# Patient Record
Sex: Female | Born: 1990 | Race: Black or African American | Hispanic: No | Marital: Single | Smoking: Current some day smoker
Health system: Southern US, Community
[De-identification: ages and names within clinical notes are randomized; demographics above are authoritative.]

## PROBLEM LIST (undated history)

## (undated) ENCOUNTER — Inpatient Hospital Stay (HOSPITAL_COMMUNITY): Payer: Self-pay

## (undated) DIAGNOSIS — R011 Cardiac murmur, unspecified: Secondary | ICD-10-CM

## (undated) DIAGNOSIS — I10 Essential (primary) hypertension: Secondary | ICD-10-CM

## (undated) DIAGNOSIS — Z8759 Personal history of other complications of pregnancy, childbirth and the puerperium: Secondary | ICD-10-CM

## (undated) DIAGNOSIS — K56609 Unspecified intestinal obstruction, unspecified as to partial versus complete obstruction: Secondary | ICD-10-CM

## (undated) DIAGNOSIS — F329 Major depressive disorder, single episode, unspecified: Secondary | ICD-10-CM

## (undated) DIAGNOSIS — J45909 Unspecified asthma, uncomplicated: Secondary | ICD-10-CM

## (undated) DIAGNOSIS — A599 Trichomoniasis, unspecified: Secondary | ICD-10-CM

## (undated) DIAGNOSIS — F32A Depression, unspecified: Secondary | ICD-10-CM

## (undated) DIAGNOSIS — Q793 Gastroschisis: Secondary | ICD-10-CM

## (undated) HISTORY — PX: TONSILLECTOMY: SUR1361

## (undated) HISTORY — DX: Personal history of other complications of pregnancy, childbirth and the puerperium: Z87.59

## (undated) HISTORY — PX: GASTROSCHISIS CLOSURE: SHX1700

## (undated) HISTORY — PX: ABDOMINAL SURGERY: SHX537

---

## 2012-06-04 ENCOUNTER — Encounter (HOSPITAL_COMMUNITY): Payer: Self-pay | Admitting: Emergency Medicine

## 2012-06-04 ENCOUNTER — Emergency Department (HOSPITAL_COMMUNITY): Payer: Self-pay

## 2012-06-04 ENCOUNTER — Emergency Department (HOSPITAL_COMMUNITY)
Admission: EM | Admit: 2012-06-04 | Discharge: 2012-06-05 | Disposition: A | Discharge status: Home / Self Care | Payer: Self-pay | Attending: Emergency Medicine | Admitting: Emergency Medicine

## 2012-06-04 DIAGNOSIS — Z3202 Encounter for pregnancy test, result negative: Secondary | ICD-10-CM | POA: Insufficient documentation

## 2012-06-04 DIAGNOSIS — R1011 Right upper quadrant pain: Secondary | ICD-10-CM | POA: Insufficient documentation

## 2012-06-04 DIAGNOSIS — F17200 Nicotine dependence, unspecified, uncomplicated: Secondary | ICD-10-CM | POA: Insufficient documentation

## 2012-06-04 DIAGNOSIS — Z8719 Personal history of other diseases of the digestive system: Secondary | ICD-10-CM | POA: Insufficient documentation

## 2012-06-04 DIAGNOSIS — N898 Other specified noninflammatory disorders of vagina: Secondary | ICD-10-CM | POA: Insufficient documentation

## 2012-06-04 DIAGNOSIS — J45909 Unspecified asthma, uncomplicated: Secondary | ICD-10-CM | POA: Insufficient documentation

## 2012-06-04 DIAGNOSIS — Z711 Person with feared health complaint in whom no diagnosis is made: Secondary | ICD-10-CM

## 2012-06-04 DIAGNOSIS — N949 Unspecified condition associated with female genital organs and menstrual cycle: Secondary | ICD-10-CM | POA: Insufficient documentation

## 2012-06-04 HISTORY — DX: Unspecified intestinal obstruction, unspecified as to partial versus complete obstruction: K56.609

## 2012-06-04 HISTORY — DX: Unspecified asthma, uncomplicated: J45.909

## 2012-06-04 LAB — CBC WITH DIFFERENTIAL/PLATELET
Basophils Absolute: 0 10*3/uL (ref 0.0–0.1)
Basophils Relative: 0 % (ref 0–1)
Eosinophils Relative: 0 % (ref 0–5)
HCT: 32.4 % — ABNORMAL LOW (ref 36.0–46.0)
MCHC: 34.6 g/dL (ref 30.0–36.0)
MCV: 83.1 fL (ref 78.0–100.0)
Monocytes Absolute: 0.6 10*3/uL (ref 0.1–1.0)
Monocytes Relative: 7 % (ref 3–12)
RDW: 13.3 % (ref 11.5–15.5)

## 2012-06-04 LAB — URINALYSIS, ROUTINE W REFLEX MICROSCOPIC
Bilirubin Urine: NEGATIVE
Hgb urine dipstick: NEGATIVE
Ketones, ur: NEGATIVE mg/dL
Nitrite: NEGATIVE
Urobilinogen, UA: 1 mg/dL (ref 0.0–1.0)
pH: 5.5 (ref 5.0–8.0)

## 2012-06-04 LAB — COMPREHENSIVE METABOLIC PANEL
ALT: 5 U/L (ref 0–35)
AST: 13 U/L (ref 0–37)
Albumin: 3.1 g/dL — ABNORMAL LOW (ref 3.5–5.2)
Alkaline Phosphatase: 70 U/L (ref 39–117)
Chloride: 98 mEq/L (ref 96–112)
Potassium: 3.4 mEq/L — ABNORMAL LOW (ref 3.5–5.1)
Sodium: 135 mEq/L (ref 135–145)
Total Bilirubin: 0.2 mg/dL — ABNORMAL LOW (ref 0.3–1.2)

## 2012-06-04 LAB — WET PREP, GENITAL: Trich, Wet Prep: NONE SEEN

## 2012-06-04 MED ORDER — IBUPROFEN 600 MG PO TABS: 600.0000 mg | ORAL_TABLET | Freq: Four times a day (QID) | ORAL | Status: DC | PRN

## 2012-06-04 MED ORDER — ONDANSETRON 8 MG PO TBDP: 8.0000 mg | ORAL_TABLET | Freq: Three times a day (TID) | ORAL | Status: DC | PRN

## 2012-06-04 MED ORDER — CEFTRIAXONE SODIUM 250 MG IJ SOLR
250.0000 mg | Freq: Once | INTRAMUSCULAR | Status: AC
  Administered 2012-06-05: 250 mg via INTRAMUSCULAR
  Filled 2012-06-04: qty 250

## 2012-06-04 MED ORDER — AZITHROMYCIN 250 MG PO TABS
1000.0000 mg | ORAL_TABLET | Freq: Once | ORAL | Status: AC
  Administered 2012-06-05: 1000 mg via ORAL
  Filled 2012-06-04: qty 4

## 2012-06-04 NOTE — ED Notes (Signed)
US at bedside

## 2012-06-04 NOTE — ED Notes (Signed)
Pt c/o RUQ pain x 2 days, other areas of abd x 3 weeks. Pt states she has been on her cycle since 05/24/12.

## 2012-06-04 NOTE — ED Provider Notes (Signed)
History     CSN: [id number]  Arrival date & time 06/04/12  1945   First MD Initiated Contact with Patient 06/04/12 2121      Chief Complaint  Patient presents with  . Abdominal Pain    (Consider location/radiation/quality/duration/timing/severity/associated sxs/prior treatment) HPI Comments: Pt comes in with cc of abd pain x 2-3 weeks. She has been having diffuse abd pain over the past few days, but has noticed more severe RUQ pain x 2 weeks. Today, the pain got unbearable, and she came to the ER. She has no UTI like sx. She has no n/v/f/c/diarrhea. The pain is sharp, and intermittent. There is no known hx of gall stones. Pt is on her period. She admits to high risk behavior for STD over the past few months. No vaginal discharge, but she is fearful of having STD.  Patient is a 22 y.o. female presenting with abdominal pain. The history is provided by the patient.  Abdominal Pain Associated symptoms: vaginal bleeding   Associated symptoms: no chest pain, no constipation, no cough, no diarrhea, no dysuria, no hematuria, no nausea, no shortness of breath, no vaginal discharge and no vomiting     Past Medical History  Diagnosis Date  . SBO (small bowel obstruction)   . Asthma     Past Surgical History  Procedure Laterality Date  . Abdominal surgery      No family history on file.  History  Substance Use Topics  . Smoking status: Current Some Day Smoker  . Smokeless tobacco: Not on file  . Alcohol Use: Yes     Comment: occ    OB History   Grav Para Term Preterm Abortions TAB SAB Ect Mult Living                  Review of Systems  Constitutional: Negative for activity change.  HENT: Negative for facial swelling and neck pain.   Respiratory: Negative for cough, shortness of breath and wheezing.   Cardiovascular: Negative for chest pain.  Gastrointestinal: Positive for abdominal pain. Negative for nausea, vomiting, diarrhea, constipation, blood in stool and abdominal  distention.  Genitourinary: Positive for vaginal bleeding and pelvic pain. Negative for dysuria, hematuria, flank pain, vaginal discharge and difficulty urinating.  Skin: Negative for color change.  Neurological: Negative for speech difficulty.  Hematological: Does not bruise/bleed easily.  Psychiatric/Behavioral: Negative for confusion.    Allergies  Review of patient's allergies indicates no known allergies.  Home Medications   Current Outpatient Rx  Name  Route  Sig  Dispense  Refill  . ibuprofen (ADVIL,MOTRIN) 200 MG tablet   Oral   Take 200 mg by mouth every 6 (six) hours as needed for pain.           BP 137/85  Pulse 93  Temp(Src) 99.2 F (37.3 C) (Oral)  Resp 18  Ht 5\' 9"  (1.753 m)  Wt 218 lb (98.884 kg)  BMI 32.18 kg/m2  SpO2 100%  LMP 05/24/2012  Physical Exam  Nursing note and vitals reviewed. Constitutional: She is oriented to person, place, and time. She appears well-developed and well-nourished.  HENT:  Head: Normocephalic and atraumatic.  Eyes: EOM are normal. Pupils are equal, round, and reactive to light.  Neck: Neck supple.  Cardiovascular: Normal rate, regular rhythm and normal heart sounds.   No murmur heard. Pulmonary/Chest: Effort normal. No respiratory distress.  Abdominal: Soft. She exhibits no distension. There is tenderness. There is no rebound and no guarding.  Diffuse tenderness. Epigastrium,  ruq, and rlq. Pt has no murphy's signs or mcburney's point.  Neurological: She is alert and oriented to person, place, and time.  Skin: Skin is warm and dry.    ED Course  Procedures (including critical care time)  Labs Reviewed  COMPREHENSIVE METABOLIC PANEL - Abnormal; Notable for the following:    Potassium 3.4 (*)    Albumin 3.1 (*)    Total Bilirubin 0.2 (*)    All other components within normal limits  CBC WITH DIFFERENTIAL - Abnormal; Notable for the following:    Hemoglobin 11.2 (*)    HCT 32.4 (*)    All other components within  normal limits  URINALYSIS, ROUTINE W REFLEX MICROSCOPIC - Abnormal; Notable for the following:    Specific Gravity, Urine 1.036 (*)    All other components within normal limits  GC/CHLAMYDIA PROBE AMP  WET PREP, GENITAL  LIPASE, BLOOD  POCT PREGNANCY, URINE   No results found.   No diagnosis found.    MDM  DDx includes: Pancreatitis Hepatobiliary pathology including cholecystitis Gastritis/PUD Intra abdominal abscess Diverticulitis Appendicitis Hernia Nephrolithiasis Pyelonephritis UTI/Cystitis Ovarian cyst TOA Ectopic pregnancy PID STD  Pt comes in with cc of abd pain. The pain has been present for 2-3 weeks, so dont suspect vascular etiology, such as ovarian torsion and conditions like appendicitis.  We will get UA to r/o gall stones and also to ensure there is no ovarian cyst or mass causing the pain - as the pain is vague and diffuse. Pt is young, child bearing, and we have no indication for CT at this time.  Pt will be treated for GC and Chlamydia. No PID on exam.  Dr. Dierdre Highman to f/u on the exam findings of the [place].            Derwood Kaplan, MD 06/04/12 [phone]

## 2012-06-05 MED ORDER — LIDOCAINE HCL 1 % IJ SOLN
INTRAMUSCULAR | Status: AC
  Filled 2012-06-05: qty 20

## 2012-06-05 MED ORDER — POTASSIUM CHLORIDE CRYS ER 20 MEQ PO TBCR
20.0000 meq | EXTENDED_RELEASE_TABLET | Freq: Once | ORAL | Status: AC
  Administered 2012-06-05: 20 meq via ORAL
  Filled 2012-06-05: qty 1

## 2012-06-05 MED ORDER — HYDROCODONE-ACETAMINOPHEN 7.5-325 MG/15ML PO SOLN
10.0000 mL | Freq: Four times a day (QID) | ORAL | Status: DC | PRN
Start: 2012-06-05 — End: 2013-04-02

## 2012-06-05 MED ORDER — ONDANSETRON 8 MG PO TBDP: 8.0000 mg | ORAL_TABLET | Freq: Three times a day (TID) | ORAL | Status: DC | PRN

## 2012-06-05 MED ORDER — IBUPROFEN 600 MG PO TABS: 600.0000 mg | ORAL_TABLET | Freq: Four times a day (QID) | ORAL | Status: DC | PRN

## 2012-06-06 LAB — GC/CHLAMYDIA PROBE AMP: GC Probe RNA: NEGATIVE

## 2012-06-07 ENCOUNTER — Telehealth (HOSPITAL_COMMUNITY): Payer: Self-pay | Admitting: Emergency Medicine

## 2012-06-11 ENCOUNTER — Telehealth (HOSPITAL_COMMUNITY): Payer: Self-pay | Admitting: Emergency Medicine

## 2012-06-12 ENCOUNTER — Telehealth (HOSPITAL_COMMUNITY): Payer: Self-pay | Admitting: Emergency Medicine

## 2012-07-09 ENCOUNTER — Telehealth (HOSPITAL_COMMUNITY): Payer: Self-pay | Admitting: Emergency Medicine

## 2012-07-09 NOTE — ED Notes (Signed)
No response to letter sent after 30 days. Chart sent to Medical Records. °

## 2013-04-02 ENCOUNTER — Encounter (HOSPITAL_COMMUNITY): Payer: Self-pay | Admitting: Emergency Medicine

## 2013-04-02 ENCOUNTER — Emergency Department (HOSPITAL_COMMUNITY)
Admission: EM | Admit: 2013-04-02 | Discharge: 2013-04-02 | Disposition: A | Discharge status: Home / Self Care | Payer: PRIVATE HEALTH INSURANCE | Attending: Emergency Medicine | Admitting: Emergency Medicine

## 2013-04-02 DIAGNOSIS — N898 Other specified noninflammatory disorders of vagina: Secondary | ICD-10-CM | POA: Insufficient documentation

## 2013-04-02 DIAGNOSIS — Z3202 Encounter for pregnancy test, result negative: Secondary | ICD-10-CM | POA: Insufficient documentation

## 2013-04-02 DIAGNOSIS — F17200 Nicotine dependence, unspecified, uncomplicated: Secondary | ICD-10-CM | POA: Insufficient documentation

## 2013-04-02 DIAGNOSIS — Z8719 Personal history of other diseases of the digestive system: Secondary | ICD-10-CM | POA: Insufficient documentation

## 2013-04-02 DIAGNOSIS — R102 Pelvic and perineal pain: Secondary | ICD-10-CM

## 2013-04-02 DIAGNOSIS — J45909 Unspecified asthma, uncomplicated: Secondary | ICD-10-CM | POA: Insufficient documentation

## 2013-04-02 DIAGNOSIS — N949 Unspecified condition associated with female genital organs and menstrual cycle: Secondary | ICD-10-CM | POA: Insufficient documentation

## 2013-04-02 LAB — URINALYSIS, ROUTINE W REFLEX MICROSCOPIC
Bilirubin Urine: NEGATIVE
GLUCOSE, UA: NEGATIVE mg/dL
Hgb urine dipstick: NEGATIVE
Ketones, ur: NEGATIVE mg/dL
LEUKOCYTES UA: NEGATIVE
Nitrite: NEGATIVE
PH: 6.5 (ref 5.0–8.0)
Protein, ur: NEGATIVE mg/dL
Specific Gravity, Urine: 1.022 (ref 1.005–1.030)
Urobilinogen, UA: 1 mg/dL (ref 0.0–1.0)

## 2013-04-02 LAB — WET PREP, GENITAL
CLUE CELLS WET PREP: NONE SEEN
Trich, Wet Prep: NONE SEEN
WBC WET PREP: NONE SEEN
YEAST WET PREP: NONE SEEN

## 2013-04-02 LAB — GC/CHLAMYDIA PROBE AMP
CT Probe RNA: NEGATIVE
GC Probe RNA: NEGATIVE

## 2013-04-02 LAB — POCT PREGNANCY, URINE: Preg Test, Ur: NEGATIVE

## 2013-04-02 MED ORDER — NAPROXEN 500 MG PO TABS: 500.0000 mg | ORAL_TABLET | Freq: Two times a day (BID) | ORAL | Status: DC

## 2013-04-02 MED ORDER — CEFTRIAXONE SODIUM 250 MG IJ SOLR
250.0000 mg | Freq: Once | INTRAMUSCULAR | Status: AC
  Administered 2013-04-02: 250 mg via INTRAMUSCULAR
  Filled 2013-04-02: qty 250

## 2013-04-02 MED ORDER — AZITHROMYCIN 250 MG PO TABS
1000.0000 mg | ORAL_TABLET | Freq: Once | ORAL | Status: AC
  Administered 2013-04-02: 1000 mg via ORAL
  Filled 2013-04-02: qty 4

## 2013-04-02 NOTE — ED Notes (Signed)
Pt arrived to the ED with a complaint of abdominal pain.  Pt has ben exposed to an STD.  Pt has swollen labias.  Pt is also having scant discharge.

## 2013-04-02 NOTE — ED Provider Notes (Signed)
CSN: [id number]     Arrival date & time 04/02/13  0254 History   First MD Initiated Contact with Patient 04/02/13 [phone]     Chief Complaint  Patient presents with  . Abdominal Pain  . Exposure to STD   (Consider location/radiation/quality/duration/timing/severity/associated sxs/prior Treatment) HPI 23 year old female presents to emergency room with complaint of lower pelvic pain starting today.  Yesterday.  She reports that she had slight pain and swelling to her labia.  She has had vaginal discharge with wiping.  She denies any urinary symptoms.  She is concern for possible exposure to STD.  She reports she and her boyfriend were recently treated for Chlamydia, and feels that he may be cheating on her and is concerned that she may have another STD.  No fevers no chills, no nausea no vomiting.  Normal bowel movements. Past Medical History  Diagnosis Date  . SBO (small bowel obstruction)   . Asthma    Past Surgical History  Procedure Laterality Date  . Abdominal surgery     History reviewed. No pertinent family history. History  Substance Use Topics  . Smoking status: Current Some Day Smoker  . Smokeless tobacco: Not on file  . Alcohol Use: Yes     Comment: occ   OB History   Grav Para Term Preterm Abortions TAB SAB Ect Mult Living                 Review of Systems  See History of Present Illness; otherwise all other systems are reviewed and negative Allergies  Review of patient's allergies indicates no known allergies.  Home Medications   Current Outpatient Rx  Name  Route  Sig  Dispense  Refill  . acetaminophen (TYLENOL) 500 MG tablet   Oral   Take 500 mg by mouth every 6 (six) hours as needed for mild pain or headache.          BP 138/83  Pulse 67  Temp(Src) 97.8 F (36.6 C) (Oral)  Resp 16  SpO2 100%  LMP 02/07/2013 Physical Exam  Nursing note and vitals reviewed. Constitutional: She is oriented to person, place, and time. She appears well-developed and  well-nourished.  HENT:  Head: Normocephalic and atraumatic.  Right Ear: External ear normal.  Left Ear: External ear normal.  Nose: Nose normal.  Mouth/Throat: Oropharynx is clear and moist.  Eyes: Conjunctivae and EOM are normal. Pupils are equal, round, and reactive to light.  Neck: Normal range of motion. Neck supple. No JVD present. No tracheal deviation present. No thyromegaly present.  Cardiovascular: Normal rate, regular rhythm, normal heart sounds and intact distal pulses.  Exam reveals no gallop and no friction rub.   No murmur heard. Pulmonary/Chest: Effort normal and breath sounds normal. No stridor. No respiratory distress. She has no wheezes. She has no rales. She exhibits no tenderness.  Abdominal: Soft. Bowel sounds are normal. She exhibits no distension and no mass. There is no tenderness. There is no rebound and no guarding.  Genitourinary:  External genitalia normal Vagina with thick white discharge Cervix closed no lesions No cervical motion tenderness Adnexa palpated, no masses or tenderness noted Bladder palpated no tenderness Uterus palpated no masses or tenderness    Musculoskeletal: Normal range of motion. She exhibits no edema and no tenderness.  Lymphadenopathy:    She has no cervical adenopathy.  Neurological: She is alert and oriented to person, place, and time. She exhibits normal muscle tone. Coordination normal.  Skin: Skin is warm and dry.  No rash noted. No erythema. No pallor.  Psychiatric: She has a normal mood and affect. Her behavior is normal. Judgment and thought content normal.    ED Course  Procedures (including critical care time) Labs Review Labs Reviewed  WET PREP, GENITAL  GC/CHLAMYDIA PROBE AMP  URINALYSIS, ROUTINE W REFLEX MICROSCOPIC  POCT PREGNANCY, URINE   Imaging Review No results found.  EKG Interpretation   None       MDM  No diagnosis found. 23 year old female with reported abdominal pain, none on exam today.  She  is worried about possible exposure to STD.  Plan to prophylactically  treat today with Rocephin and azithromycin.  She does not have any objective signs of PID requiring doxycycline.  No signs of urinary tract infection, no signs of yeast infection.   Kalman Drape, MD 04/02/13 [phone]

## 2013-04-02 NOTE — Discharge Instructions (Signed)
Your workup today does not show signs of vaginal infection or urinary tract infection.  You were treated for possible exposure to gonorrhea and Chlamydia.  Those labs will take a few days to return.  In the meantime, use protection when having sexual intercourse.  Return to the emergency room for worsening abdominal pain or new concerning symptoms.   Pelvic Pain, Female Female pelvic pain can be caused by many different things and start from a variety of places. Pelvic pain refers to pain that is located in the lower half of the abdomen and between your hips. The pain may occur over a short period of time (acute) or may be reoccurring (chronic). The cause of pelvic pain may be related to disorders affecting the female reproductive organs (gynecologic), but it may also be related to the bladder, kidney stones, an intestinal complication, or muscle or skeletal problems. Getting help right away for pelvic pain is important, especially if there has been severe, sharp, or a sudden onset of unusual pain. It is also important to get help right away because some types of pelvic pain can be life threatening.  CAUSES  Below are only some of the causes of pelvic pain. The causes of pelvic pain can be in one of several categories.   Gynecologic.  Pelvic inflammatory disease.  Sexually transmitted infection.  Ovarian cyst or a twisted ovarian ligament (ovarian torsion).  Uterine lining that grows outside the uterus (endometriosis).  Fibroids, cysts, or tumors.  Ovulation.  Pregnancy.  Pregnancy that occurs outside the uterus (ectopic pregnancy).  Miscarriage.  Labor.  Abruption of the placenta or ruptured uterus.  Infection.  Uterine infection (endometritis).  Bladder infection.  Diverticulitis.  Miscarriage related to a uterine infection (septic abortion).  Bladder.  Inflammation of the bladder (cystitis).  Kidney  stone(s).  Gastrointenstinal.  Constipation.  Diverticulitis.  Neurologic.  Trauma.  Feeling pelvic pain because of mental or emotional causes (psychosomatic).  Cancers of the bowel or pelvis. EVALUATION  Your caregiver will want to take a careful history of your concerns. This includes recent changes in your health, a careful gynecologic history of your periods (menses), and a sexual history. Obtaining your family history and medical history is also important. Your caregiver may suggest a pelvic exam. A pelvic exam will help identify the location and severity of the pain. It also helps in the evaluation of which organ system may be involved. In order to identify the cause of the pelvic pain and be properly treated, your caregiver may order tests. These tests may include:   A pregnancy test.  Pelvic ultrasonography.  An X-ray exam of the abdomen.  A urinalysis or evaluation of vaginal discharge.  Blood tests. HOME CARE INSTRUCTIONS   Only take over-the-counter or prescription medicines for pain, discomfort, or fever as directed by your caregiver.   Rest as directed by your caregiver.   Eat a balanced diet.   Drink enough fluids to make your urine clear or pale yellow, or as directed.   Avoid sexual intercourse if it causes pain.   Apply warm or cold compresses to the lower abdomen depending on which one helps the pain.   Avoid stressful situations.   Keep a journal of your pelvic pain. Write down when it started, where the pain is located, and if there are things that seem to be associated with the pain, such as food or your menstrual cycle.  Follow up with your caregiver as directed.  SEEK MEDICAL CARE IF:  Your  medicine does not help your pain.  You have abnormal vaginal discharge. SEEK IMMEDIATE MEDICAL CARE IF:   You have heavy bleeding from the vagina.   Your pelvic pain increases.   You feel lightheaded or faint.   You have chills.   You  have pain with urination or blood in your urine.   You have uncontrolled diarrhea or vomiting.   You have a fever or persistent symptoms for more than 3 days.  You have a fever and your symptoms suddenly get worse.   You are being physically or sexually abused.  MAKE SURE YOU:  Understand these instructions.  Will watch your condition.  Will get help if you are not doing well or get worse. Document Released: 01/06/2004 Document Revised: 08/10/2011 Document Reviewed: 05/31/2011 [facility] Patient Information 2014 [place], [place].

## 2013-07-20 ENCOUNTER — Ambulatory Visit (INDEPENDENT_AMBULATORY_CARE_PROVIDER_SITE_OTHER): Payer: PRIVATE HEALTH INSURANCE | Admitting: Family Medicine

## 2013-07-20 VITALS — BP 120/72 | HR 75 | Temp 98.3°F | Resp 18 | Ht 69.0 in | Wt 210.0 lb

## 2013-07-20 DIAGNOSIS — Z331 Pregnant state, incidental: Secondary | ICD-10-CM

## 2013-07-20 DIAGNOSIS — Z3490 Encounter for supervision of normal pregnancy, unspecified, unspecified trimester: Secondary | ICD-10-CM

## 2013-07-20 DIAGNOSIS — N926 Irregular menstruation, unspecified: Secondary | ICD-10-CM

## 2013-07-20 DIAGNOSIS — N644 Mastodynia: Secondary | ICD-10-CM

## 2013-07-20 LAB — POCT URINE PREGNANCY: PREG TEST UR: POSITIVE

## 2013-07-20 NOTE — Patient Instructions (Addendum)
You are pregnant approximately 4 weeks if the date of May 2 was the onset of your last menstrual cycle.  This would make you due on about Feb 9, a week or so earlier if you conceived on May 8.  Prenatal vitamins--ask pharmacist for a good one  Arrange to see an OB/GYN doctor in a couple of months.  Go to web MD and look for information

## 2013-07-20 NOTE — Progress Notes (Signed)
Subjective:  Patient is here with history of having had unprotected intercourse on May 8, and thinks she may be pregnant. Her last menstrual cycle started May 2 until the eighth. She has had breast tenderness. No other major complaints. She is semi-excited about it.  Objective: Pleasant young lady in no acute distress. No CVA tenderness. Abdomen soft without mass or tenderness.  Assessment: Possible pregnancy  Plan: Pregnancy test Test is positive Prenatal vitamins She is to find an OB/GYN soon. Return if problems.

## 2013-08-06 ENCOUNTER — Encounter (HOSPITAL_COMMUNITY): Payer: Self-pay | Admitting: *Deleted

## 2013-08-06 ENCOUNTER — Inpatient Hospital Stay (HOSPITAL_COMMUNITY)
Admission: AD | Admit: 2013-08-06 | Discharge: 2013-08-06 | Disposition: A | Discharge status: Home / Self Care | Payer: Medicaid Other | Source: Ambulatory Visit | Attending: Obstetrics & Gynecology | Admitting: Obstetrics & Gynecology

## 2013-08-06 ENCOUNTER — Inpatient Hospital Stay (HOSPITAL_COMMUNITY): Payer: Medicaid Other

## 2013-08-06 DIAGNOSIS — J45909 Unspecified asthma, uncomplicated: Secondary | ICD-10-CM | POA: Insufficient documentation

## 2013-08-06 DIAGNOSIS — O2390 Unspecified genitourinary tract infection in pregnancy, unspecified trimester: Secondary | ICD-10-CM | POA: Insufficient documentation

## 2013-08-06 DIAGNOSIS — B3731 Acute candidiasis of vulva and vagina: Secondary | ICD-10-CM

## 2013-08-06 DIAGNOSIS — B373 Candidiasis of vulva and vagina: Secondary | ICD-10-CM

## 2013-08-06 DIAGNOSIS — Z3490 Encounter for supervision of normal pregnancy, unspecified, unspecified trimester: Secondary | ICD-10-CM

## 2013-08-06 DIAGNOSIS — R109 Unspecified abdominal pain: Secondary | ICD-10-CM | POA: Insufficient documentation

## 2013-08-06 DIAGNOSIS — Z87891 Personal history of nicotine dependence: Secondary | ICD-10-CM | POA: Insufficient documentation

## 2013-08-06 DIAGNOSIS — O26899 Other specified pregnancy related conditions, unspecified trimester: Secondary | ICD-10-CM

## 2013-08-06 LAB — URINALYSIS, ROUTINE W REFLEX MICROSCOPIC
Bilirubin Urine: NEGATIVE
Glucose, UA: NEGATIVE mg/dL
Hgb urine dipstick: NEGATIVE
Ketones, ur: NEGATIVE mg/dL
Leukocytes, UA: NEGATIVE
NITRITE: NEGATIVE
PH: 6 (ref 5.0–8.0)
Protein, ur: NEGATIVE mg/dL
SPECIFIC GRAVITY, URINE: 1.015 (ref 1.005–1.030)
Urobilinogen, UA: 0.2 mg/dL (ref 0.0–1.0)

## 2013-08-06 LAB — HCG, QUANTITATIVE, PREGNANCY: HCG, BETA CHAIN, QUANT, S: 25109 m[IU]/mL — AB (ref <5)

## 2013-08-06 LAB — ABO/RH: ABO/RH(D): O POS

## 2013-08-06 LAB — WET PREP, GENITAL
Clue Cells Wet Prep HPF POC: NONE SEEN
Trich, Wet Prep: NONE SEEN

## 2013-08-06 MED ORDER — FLUCONAZOLE 150 MG PO TABS: ORAL_TABLET | ORAL | Status: DC

## 2013-08-06 MED ORDER — ACETAMINOPHEN 500 MG PO TABS
1000.0000 mg | ORAL_TABLET | Freq: Once | ORAL | Status: AC
  Administered 2013-08-06: 1000 mg via ORAL
  Filled 2013-08-06: qty 2

## 2013-08-06 MED ORDER — PRENATAL PLUS 27-1 MG PO TABS: 1.0000 | ORAL_TABLET | Freq: Every day | ORAL | Status: DC

## 2013-08-06 NOTE — MAU Note (Addendum)
Has been having more cramping than what she thought she should be.  Is stronger than it was.  Also having irritation around vaginal lips

## 2013-08-06 NOTE — MAU Provider Note (Signed)
Chief Complaint: Abdominal Cramping   First Provider Initiated Contact with Patient 08/06/13 1726     SUBJECTIVE HPI: Jody Palmer is a 23 y.o. G1P0 at [redacted]w[redacted]d by unsure LMP who presents with 2-3 day history of suprapubic cramping pain. She's been having mild cramping since pregnancy diagnosed but the pain has intensified. Having pain now.  Denies vaginal bleeding, dysuria, hematuria, urgency or frequency of urination. She has had some malodorous white vaginal discharge and some itching of the external genitalia. No new sex partner. Pregnancy is desired.   Past Medical History  Diagnosis Date  . SBO (small bowel obstruction)     this was during her first year of life, no problems since  . Asthma    OB History  Gravida Para Term Preterm AB SAB TAB Ectopic Multiple Living  1             # Outcome Date GA Lbr Len/2nd Weight Sex Delivery Anes PTL Lv  1 CUR              Past Surgical History  Procedure Laterality Date  . Abdominal surgery    . Tonsillectomy     History   Social History  . Marital Status: Single    Spouse Name: N/A    Number of Children: N/A  . Years of Education: N/A   Occupational History  . Not on file.   Social History Main Topics  . Smoking status: Former Smoker    Types: Cigars    Quit date: 05/06/2013  . Smokeless tobacco: Not on file  . Alcohol Use: Yes     Comment: occ  . Drug Use: Yes    Special: Marijuana     Comment: last use May 2015  . Sexual Activity: Yes     Comment: last sex June 12   Other Topics Concern  . Not on file   Social History Narrative  . No narrative on file   No current facility-administered medications on file prior to encounter.   No current outpatient prescriptions on file prior to encounter.   No Known Allergies  ROS: Pertinent items in HPI  OBJECTIVE Blood pressure 130/80, pulse 76, temperature 98.6 F (37 C), resp. rate 18, height 5\' 7"  (1.702 m), weight 97.523 kg (215 lb), last menstrual period  06/23/2013. GENERAL: Well-developed, well-nourished female in no acute distress.  HEENT: Normocephalic HEART: normal rate RESP: normal effort ABDOMEN: Soft, non-tender EXTREMITIES: Nontender, no edema NEURO: Alert and oriented SPECULUM EXAM: NEFG , Mild erythema of labia minora and vagina, small amt white discharge, cervix clean BIMANUAL: cervix thick, closed; uterus 6-8 wk size, no adnexal tenderness or masses  LAB RESULTS Results for orders placed during the hospital encounter of 08/06/13 (from the past 24 hour(s))  URINALYSIS, ROUTINE W REFLEX MICROSCOPIC     Status: None   Collection Time    08/06/13  5:05 PM      Result Value Ref Range   Color, Urine YELLOW  YELLOW   APPearance CLEAR  CLEAR   Specific Gravity, Urine 1.015  1.005 - 1.030   pH 6.0  5.0 - 8.0   Glucose, UA NEGATIVE  NEGATIVE mg/dL   Hgb urine dipstick NEGATIVE  NEGATIVE   Bilirubin Urine NEGATIVE  NEGATIVE   Ketones, ur NEGATIVE  NEGATIVE mg/dL   Protein, ur NEGATIVE  NEGATIVE mg/dL   Urobilinogen, UA 0.2  0.0 - 1.0 mg/dL   Nitrite NEGATIVE  NEGATIVE   Leukocytes, UA NEGATIVE  NEGATIVE  WET  PREP, GENITAL     Status: Abnormal   Collection Time    08/06/13  5:33 PM      Result Value Ref Range   Yeast Wet Prep HPF POC MODERATE (*) NONE SEEN   Trich, Wet Prep NONE SEEN  NONE SEEN   Clue Cells Wet Prep HPF POC NONE SEEN  NONE SEEN   WBC, Wet Prep HPF POC FEW (*) NONE SEEN  ABO/RH     Status: None   Collection Time    08/06/13  5:40 PM      Result Value Ref Range   ABO/RH(D) O POS      IMAGING US Ob Comp Less 14 Wks  08/06/2013   CLINICAL DATA:  Abdominal pain.  EXAM: OBSTETRIC <14 WK [place] AND TRANSVAGINAL OB US  TECHNIQUE: Both transabdominal and transvaginal ultrasound examinations were performed for complete evaluation of the gestation as well as the maternal uterus, adnexal regions, and pelvic cul-de-sac. Transvaginal technique was performed to assess early pregnancy.  COMPARISON:  None.  FINDINGS:  Intrauterine gestational sac: Visualized/normal in shape.  Yolk sac:  Visualized  Embryo:  Visualized  Cardiac Activity: Visualized  Heart Rate:  135 bpm  MSD:    mm    w     d  CRL:   8.9  mm   7 w 0 d                  [place] EDC: 03/25/2014  Maternal uterus/adnexae: No subchorionic hemorrhage. No adnexal masses or free fluid. Ovaries are symmetric in size and echotexture.  IMPRESSION: 7 week intrauterine pregnancy. Fetal heart rate 135 beats per min. No acute maternal findings.   Electronically Signed   By: Rolm Baptise M.D.   On: 08/06/2013 18:36   US Ob Transvaginal  08/06/2013   CLINICAL DATA:  Abdominal pain.  EXAM: OBSTETRIC <14 WK [place] AND TRANSVAGINAL OB US  TECHNIQUE: Both transabdominal and transvaginal ultrasound examinations were performed for complete evaluation of the gestation as well as the maternal uterus, adnexal regions, and pelvic cul-de-sac. Transvaginal technique was performed to assess early pregnancy.  COMPARISON:  None.  FINDINGS: Intrauterine gestational sac: Visualized/normal in shape.  Yolk sac:  Visualized  Embryo:  Visualized  Cardiac Activity: Visualized  Heart Rate:  135 bpm  MSD:    mm    w     d  CRL:   8.9  mm   7 w 0 d                  [place] EDC: 03/25/2014  Maternal uterus/adnexae: No subchorionic hemorrhage. No adnexal masses or free fluid. Ovaries are symmetric in size and echotexture.  IMPRESSION: 7 week intrauterine pregnancy. Fetal heart rate 135 beats per min. No acute maternal findings.   Electronically Signed   By: Rolm Baptise M.D.   On: 08/06/2013 18:36    MAU COURSE GC/CT sent c/o H/A after US> Acetaminophen 1000mg  po  ASSESSMENT 1. Abdominal pain affecting pregnancy   2. Yeast infection involving the vagina and surrounding area   3. Normal IUP (intrauterine pregnancy) on prenatal ultrasound     PLAN Discharge home    Medication List         fluconazole 150 MG tablet  Commonly known as:  DIFLUCAN  Take 1 tab now. Repeat in 2 days if needed      prenatal multivitamin Tabs tablet  Take 1 tablet by mouth daily at 12 noon.  prenatal vitamin w/FE, FA 27-1 MG Tabs tablet  Take 1 tablet by mouth daily.       Follow-up Information   Schedule an appointment as soon as possible for a visit with [facility] [place]. (Choose pregnancy care provider from the list given)    Contact information:   [place] [id number] [place], CNM 08/06/2013  5:29 PM

## 2013-08-06 NOTE — MAU Provider Note (Signed)
Attestation of Attending Supervision of Advanced Practitioner (CNM/NP): Evaluation and management procedures were performed by the Advanced Practitioner under my supervision and collaboration.  I have reviewed the Advanced Practitioner's note and chart, and I agree with the management and plan.  HARRAWAY-SMITH, Shadae Reino 8:44 PM

## 2013-08-06 NOTE — Discharge Instructions (Signed)
Prenatal Care Providers [facility]  Phone385 [phone]     Phone: [place]                      [facility]  @[place]     Phone: [phone]  Phone: [place]     Phone: [phone]  Phone: [facility] [place]                hone: [phone]  Phone: [phone]         [facility] [address]. Gracy Racer      Phone: [phone]  Phone: [phone]         [place] [facility]                Phone: [phone]  Wolsey Phillips)          Phone: [phone] [facility]   Phone: [phone] - First Trimester During sexual intercourse, millions of sperm go into the vagina. Only 1 sperm will penetrate and fertilize the female egg while it is in the Fallopian tube. One week later, the fertilized egg implants into the wall of the uterus. An embryo begins to develop into a baby. At 6 to 8 weeks, the eyes and face are formed and the heartbeat can be seen on ultrasound. At the end of 12 weeks (first trimester), all the baby's organs are formed. Now that you are pregnant, you will want to do everything you can to have a healthy baby. Two of the most important things are to get good prenatal care and follow your caregiver's instructions. Prenatal care is all the medical care you receive before the baby's birth. It is given to prevent, find, and treat problems during the pregnancy and childbirth. PRENATAL EXAMS  During prenatal visits, your weight, blood pressure, and urine are checked. This is done to make sure you are healthy and progressing normally during the pregnancy.  A pregnant woman should gain 25 to 35 pounds during the pregnancy. However, if you are overweight or  underweight, your caregiver will advise you regarding your weight.  Your caregiver will ask and answer questions for you.  Blood work, cervical cultures, other necessary tests, and a Pap test are done during your prenatal exams. These tests are done to check on your health and the probable health of your baby. Tests are strongly recommended and done for HIV with your permission. This is the virus that causes AIDS. These tests are done because medicines can be given to help prevent your baby from being born with this infection should you have been infected without knowing it. Blood work is also used to find out your blood type, previous infections, and follow your blood levels (hemoglobin).  Low hemoglobin (anemia) is common during pregnancy. Iron and vitamins are given to help prevent this. Later in the pregnancy, blood tests for diabetes will be done along with any other tests if any problems develop.  You may need other tests to make sure you and the baby are doing well. CHANGES DURING THE FIRST TRIMESTER  Your body  goes through many changes during pregnancy. They vary from person to person. Talk to your caregiver about changes you notice and are concerned about. Changes can include:  Your menstrual period stops.  The egg and sperm carry the genes that determine what you look like. Genes from you and your partner are forming a baby. The female genes determine whether the baby is a boy or a girl.  Your body increases in girth and you may feel bloated.  Feeling sick to your stomach (nauseous) and throwing up (vomiting). If the vomiting is uncontrollable, call your caregiver.  Your breasts will begin to enlarge and become tender.  Your nipples may stick out more and become darker.  The need to urinate more. Painful urination may mean you have a bladder infection.  Tiring easily.  Loss of appetite.  Cravings for certain kinds of food.  At first, you may gain or lose a couple of  pounds.  You may have changes in your emotions from day to day (excited to be pregnant or concerned something may go wrong with the pregnancy and baby).  You may have more vivid and strange dreams. HOME CARE INSTRUCTIONS   It is very important to avoid all smoking, alcohol and non-prescribed drugs during your pregnancy. These affect the formation and growth of the baby. Avoid chemicals while pregnant to ensure the delivery of a healthy infant.  Start your prenatal visits by the 12th week of pregnancy. They are usually scheduled monthly at first, then more often in the last 2 months before delivery. Keep your caregiver's appointments. Follow your caregiver's instructions regarding medicine use, blood and lab tests, exercise, and diet.  During pregnancy, you are providing food for you and your baby. Eat regular, well-balanced meals. Choose foods such as meat, fish, milk and other low fat dairy products, vegetables, fruits, and whole-grain breads and cereals. Your caregiver will tell you of the ideal weight gain.  You can help morning sickness by keeping soda crackers at the bedside. Eat a couple before arising in the morning. You may want to use the crackers without salt on them.  Eating 4 to 5 small meals rather than 3 large meals a day also may help the nausea and vomiting.  Drinking liquids between meals instead of during meals also seems to help nausea and vomiting.  A physical sexual relationship may be continued throughout pregnancy if there are no other problems. Problems may be early (premature) leaking of amniotic fluid from the membranes, vaginal bleeding, or belly (abdominal) pain.  Exercise regularly if there are no restrictions. Check with your caregiver or physical therapist if you are unsure of the safety of some of your exercises. Greater weight gain will occur in the last 2 trimesters of pregnancy. Exercising will help:  Control your weight.  Keep you in shape.  Prepare you  for labor and delivery.  Help you lose your pregnancy weight after you deliver your baby.  Wear a good support or jogging bra for breast tenderness during pregnancy. This may help if worn during sleep too.  Ask when prenatal classes are available. Begin classes when they are offered.  Do not use hot tubs, steam rooms, or saunas.  Wear your seat belt when driving. This protects you and your baby if you are in an accident.  Avoid raw meat, uncooked cheese, cat litter boxes, and soil used by cats throughout the pregnancy. These carry germs that can cause birth defects in the baby.  The first trimester is  a good time to visit your dentist for your dental health. Getting your teeth cleaned is okay. Use a softer toothbrush and brush gently during pregnancy.  Ask for help if you have financial, counseling, or nutritional needs during pregnancy. Your caregiver will be able to offer counseling for these needs as well as refer you for other special needs.  Do not take any medicines or herbs unless told by your caregiver.  Inform your caregiver if there is any mental or physical domestic violence.  Make a list of emergency phone numbers of family, friends, hospital, and police and fire departments.  Write down your questions. Take them to your prenatal visit.  Do not douche.  Do not cross your legs.  If you have to stand for long periods of time, rotate you feet or take small steps in a circle.  You may have more vaginal secretions that may require a sanitary pad. Do not use tampons or scented sanitary pads. MEDICINES AND DRUG USE IN PREGNANCY  Take prenatal vitamins as directed. The vitamin should contain 1 milligram of folic acid. Keep all vitamins out of reach of children. Only a couple vitamins or tablets containing iron may be fatal to a baby or young child when ingested.  Avoid use of all medicines, including herbs, over-the-counter medicines, not prescribed or suggested by your  caregiver. Only take over-the-counter or prescription medicines for pain, discomfort, or fever as directed by your caregiver. Do not use aspirin, ibuprofen, or naproxen unless directed by your caregiver.  Let your caregiver also know about herbs you may be using.  Alcohol is related to a number of birth defects. This includes fetal alcohol syndrome. All alcohol, in any form, should be avoided completely. Smoking will cause low birth rate and premature babies.  Street or illegal drugs are very harmful to the baby. They are absolutely forbidden. A baby born to an addicted mother will be addicted at birth. The baby will go through the same withdrawal an adult does.  Let your caregiver know about any medicines that you have to take and for what reason you take them. SEEK MEDICAL CARE IF:  You have any concerns or worries during your pregnancy. It is better to call with your questions if you feel they cannot wait, rather than worry about them. SEEK IMMEDIATE MEDICAL CARE IF:   An unexplained oral temperature above 102 F (38.9 C) develops, or as your caregiver suggests.  You have leaking of fluid from the vagina (birth canal). If leaking membranes are suspected, take your temperature and inform your caregiver of this when you call.  There is vaginal spotting or bleeding. Notify your caregiver of the amount and how many pads are used.  You develop a bad smelling vaginal discharge with a change in the color.  You continue to feel sick to your stomach (nauseated) and have no relief from remedies suggested. You vomit blood or coffee ground-like materials.  You lose more than 2 pounds of weight in 1 week.  You gain more than 2 pounds of weight in 1 week and you notice swelling of your face, hands, feet, or legs.  You gain 5 pounds or more in 1 week (even if you do not have swelling of your hands, face, legs, or feet).  You get exposed to [place] measles and have never had them.  You are exposed  to fifth disease or chickenpox.  You develop belly (abdominal) pain. Round ligament discomfort is a common non-cancerous (benign) cause of  abdominal pain in pregnancy. Your caregiver still must evaluate this.  You develop headache, fever, diarrhea, pain with urination, or shortness of breath.  You fall or are in a car accident or have any kind of trauma.  There is mental or physical violence in your home. Document Released: 02/02/2001 Document Revised: 11/03/2011 Document Reviewed: 08/06/2008 [facility] [employer] Patient Information 2014 [place].

## 2013-08-07 LAB — GC/CHLAMYDIA PROBE AMP
CT PROBE, AMP APTIMA: NEGATIVE
GC Probe RNA: NEGATIVE

## 2013-08-24 ENCOUNTER — Inpatient Hospital Stay (HOSPITAL_COMMUNITY)
Admission: AD | Admit: 2013-08-24 | Discharge: 2013-08-24 | Disposition: A | Discharge status: Home / Self Care | Payer: Medicaid Other | Source: Ambulatory Visit | Attending: Obstetrics and Gynecology | Admitting: Obstetrics and Gynecology

## 2013-08-24 ENCOUNTER — Encounter (HOSPITAL_COMMUNITY): Payer: Self-pay | Admitting: *Deleted

## 2013-08-24 DIAGNOSIS — Z87891 Personal history of nicotine dependence: Secondary | ICD-10-CM | POA: Insufficient documentation

## 2013-08-24 DIAGNOSIS — O9989 Other specified diseases and conditions complicating pregnancy, childbirth and the puerperium: Principal | ICD-10-CM

## 2013-08-24 DIAGNOSIS — O99891 Other specified diseases and conditions complicating pregnancy: Secondary | ICD-10-CM | POA: Insufficient documentation

## 2013-08-24 DIAGNOSIS — R109 Unspecified abdominal pain: Secondary | ICD-10-CM | POA: Diagnosis not present

## 2013-08-24 DIAGNOSIS — O26899 Other specified pregnancy related conditions, unspecified trimester: Secondary | ICD-10-CM

## 2013-08-24 LAB — URINALYSIS, ROUTINE W REFLEX MICROSCOPIC
Bilirubin Urine: NEGATIVE
GLUCOSE, UA: NEGATIVE mg/dL
Hgb urine dipstick: NEGATIVE
Ketones, ur: NEGATIVE mg/dL
Leukocytes, UA: NEGATIVE
Nitrite: NEGATIVE
PROTEIN: NEGATIVE mg/dL
Specific Gravity, Urine: 1.02 (ref 1.005–1.030)
UROBILINOGEN UA: 0.2 mg/dL (ref 0.0–1.0)
pH: 6 (ref 5.0–8.0)

## 2013-08-24 NOTE — MAU Note (Signed)
Pt having cramping.  Pt was seen for cramping a few weeks ago, continues to have cramping.  Denies bleeding or discharge.

## 2013-08-24 NOTE — MAU Note (Signed)
Last intercourse 2+weeks ago, denies bleeding or vag d/c.

## 2013-08-24 NOTE — MAU Provider Note (Signed)
Chief Complaint: Dysmenorrhea   First Provider Initiated Contact with Patient 08/24/13 1034     SUBJECTIVE HPI: Jody Palmer is a 23 y.o. G1P0 at [redacted]w[redacted]d by LMP who presents to maternity admissions reporting abdominal cramping daily.  She reports this is unchanged from her previous visit to MAU 2 weeks ago but it is continuous so she is worried about the pregnancy.  She denies LOF, vaginal bleeding, vaginal itching/burning, urinary symptoms, h/a, dizziness, n/v, or fever/chills.     Past Medical History  Diagnosis Date  . SBO (small bowel obstruction)     this was during her first year of life, no problems since  . Asthma    Past Surgical History  Procedure Laterality Date  . Abdominal surgery    . Tonsillectomy     History   Social History  . Marital Status: Single    Spouse Name: N/A    Number of Children: N/A  . Years of Education: N/A   Occupational History  . Not on file.   Social History Main Topics  . Smoking status: Former Smoker    Types: Cigars    Quit date: 05/06/2013  . Smokeless tobacco: Never Used  . Alcohol Use: Yes     Comment: occ  . Drug Use: Yes    Special: Marijuana     Comment: last use May 2015  . Sexual Activity: Yes     Comment: last sex June 12   Other Topics Concern  . Not on file   Social History Narrative  . No narrative on file   No current facility-administered medications on file prior to encounter.   Current Outpatient Prescriptions on File Prior to Encounter  Medication Sig Dispense Refill  . Prenatal Vit-Fe Fumarate-FA (PRENATAL MULTIVITAMIN) TABS tablet Take 1 tablet by mouth daily at 12 noon.       No Known Allergies  ROS: Pertinent items in HPI  OBJECTIVE Blood pressure 122/70, pulse 71, temperature 99 F (37.2 C), temperature source Oral, resp. rate 16, height 5\' 8"  (1.727 m), weight 99.973 kg (220 lb 6.4 oz), last menstrual period 06/23/2013. GENERAL: Well-developed, well-nourished female in no acute distress.   HEENT: Normocephalic HEART: normal rate RESP: normal effort ABDOMEN: Soft, non-tender EXTREMITIES: Nontender, no edema NEURO: Alert and oriented    LAB RESULTS Results for orders placed during the hospital encounter of 08/24/13 (from the past 24 hour(s))  URINALYSIS, ROUTINE W REFLEX MICROSCOPIC     Status: None   Collection Time    08/24/13  9:11 AM      Result Value Ref Range   Color, Urine YELLOW  YELLOW   APPearance CLEAR  CLEAR   Specific Gravity, Urine 1.020  1.005 - 1.030   pH 6.0  5.0 - 8.0   Glucose, UA NEGATIVE  NEGATIVE mg/dL   Hgb urine dipstick NEGATIVE  NEGATIVE   Bilirubin Urine NEGATIVE  NEGATIVE   Ketones, ur NEGATIVE  NEGATIVE mg/dL   Protein, ur NEGATIVE  NEGATIVE mg/dL   Urobilinogen, UA 0.2  0.0 - 1.0 mg/dL   Nitrite NEGATIVE  NEGATIVE   Leukocytes, UA NEGATIVE  NEGATIVE    IMAGING  08/24/13 Bedside sono by Dr Leslie Andrea reveals viable pregnancy with size consistent with dates.  US Ob Comp Less 14 Wks  08/06/2013   CLINICAL DATA:  Abdominal pain.  EXAM: OBSTETRIC <14 WK [place] AND TRANSVAGINAL OB US  TECHNIQUE: Both transabdominal and transvaginal ultrasound examinations were performed for complete evaluation of the gestation as well as  the maternal uterus, adnexal regions, and pelvic cul-de-sac. Transvaginal technique was performed to assess early pregnancy.  COMPARISON:  None.  FINDINGS: Intrauterine gestational sac: Visualized/normal in shape.  Yolk sac:  Visualized  Embryo:  Visualized  Cardiac Activity: Visualized  Heart Rate:  135 bpm  MSD:    mm    w     d  CRL:   8.9  mm   7 w 0 d                  [place] EDC: 03/25/2014  Maternal uterus/adnexae: No subchorionic hemorrhage. No adnexal masses or free fluid. Ovaries are symmetric in size and echotexture.  IMPRESSION: 7 week intrauterine pregnancy. Fetal heart rate 135 beats per min. No acute maternal findings.   Electronically Signed   By: Rolm Baptise M.D.   On: 08/06/2013 18:36   ASSESSMENT 1. Abdominal pain  affecting pregnancy     PLAN Discharge home F/U with prenatal care as scheduled Increase PO fluids Return to MAU as needed for emergencies    Medication List         albuterol 108 (90 BASE) MCG/ACT inhaler  Commonly known as:  PROVENTIL HFA;VENTOLIN HFA  Inhale 1-2 puffs into the lungs every 6 (six) hours as needed for wheezing or shortness of breath.     prenatal multivitamin Tabs tablet  Take 1 tablet by mouth daily at 12 noon.       Follow-up Information   Follow up with [facility] [place]. (For prenatal care)    Contact information:   [place] [phone]      Follow up with [place].   Contact information:   [address] [id number] [place] [address] [place] Certified Nurse-Midwife 08/24/2013  2:54 PM

## 2013-08-24 NOTE — Discharge Instructions (Signed)

## 2013-08-26 NOTE — MAU Provider Note (Signed)
Attestation of Attending Supervision of Advanced Practitioner (CNM/NP): Evaluation and management procedures were performed by the Advanced Practitioner under my supervision and collaboration.  I have reviewed the Advanced Practitioner's note and chart, and I agree with the management and plan.  Kaidence Callaway 08/26/2013 8:12 PM

## 2013-08-30 ENCOUNTER — Other Ambulatory Visit (HOSPITAL_COMMUNITY): Payer: Self-pay | Admitting: Family

## 2013-08-30 DIAGNOSIS — Z3689 Encounter for other specified antenatal screening: Secondary | ICD-10-CM

## 2013-08-30 LAB — OB RESULTS CONSOLE GC/CHLAMYDIA
CHLAMYDIA, DNA PROBE: NEGATIVE
Gonorrhea: NEGATIVE

## 2013-08-30 LAB — OB RESULTS CONSOLE ANTIBODY SCREEN: ANTIBODY SCREEN: NEGATIVE

## 2013-08-30 LAB — OB RESULTS CONSOLE VARICELLA ZOSTER ANTIBODY, IGG: Varicella: IMMUNE

## 2013-08-30 LAB — OB RESULTS CONSOLE RUBELLA ANTIBODY, IGM: RUBELLA: IMMUNE

## 2013-08-30 LAB — OB RESULTS CONSOLE HEPATITIS B SURFACE ANTIGEN: HEP B S AG: NEGATIVE

## 2013-08-30 LAB — HEMOGLOBIN EVAL RFX ELECTROPHORESIS
Cystic Fibrosis Profile: NEGATIVE
Hemoglobin Evaluation: NORMAL

## 2013-08-30 LAB — OB RESULTS CONSOLE ABO/RH: RH TYPE: POSITIVE

## 2013-08-30 LAB — OB RESULTS CONSOLE RPR: RPR: NONREACTIVE

## 2013-08-30 LAB — OB RESULTS CONSOLE HIV ANTIBODY (ROUTINE TESTING): HIV: NONREACTIVE

## 2013-10-31 ENCOUNTER — Ambulatory Visit (HOSPITAL_COMMUNITY)
Admission: RE | Admit: 2013-10-31 | Discharge: 2013-10-31 | Disposition: A | Discharge status: Home / Self Care | Payer: PRIVATE HEALTH INSURANCE | Source: Ambulatory Visit | Attending: Family | Admitting: Family

## 2013-10-31 ENCOUNTER — Other Ambulatory Visit (HOSPITAL_COMMUNITY): Payer: Self-pay | Admitting: Family

## 2013-10-31 DIAGNOSIS — Z3689 Encounter for other specified antenatal screening: Secondary | ICD-10-CM

## 2013-10-31 DIAGNOSIS — O99210 Obesity complicating pregnancy, unspecified trimester: Secondary | ICD-10-CM | POA: Diagnosis not present

## 2013-10-31 DIAGNOSIS — O358XX0 Maternal care for other (suspected) fetal abnormality and damage, not applicable or unspecified: Secondary | ICD-10-CM | POA: Insufficient documentation

## 2013-10-31 DIAGNOSIS — E669 Obesity, unspecified: Secondary | ICD-10-CM | POA: Insufficient documentation

## 2013-10-31 DIAGNOSIS — O352XX0 Maternal care for (suspected) hereditary disease in fetus, not applicable or unspecified: Secondary | ICD-10-CM | POA: Insufficient documentation

## 2013-10-31 DIAGNOSIS — O352XX1 Maternal care for (suspected) hereditary disease in fetus, fetus 1: Secondary | ICD-10-CM

## 2013-10-31 DIAGNOSIS — O99891 Other specified diseases and conditions complicating pregnancy: Secondary | ICD-10-CM

## 2013-10-31 DIAGNOSIS — O9989 Other specified diseases and conditions complicating pregnancy, childbirth and the puerperium: Secondary | ICD-10-CM

## 2013-11-06 ENCOUNTER — Other Ambulatory Visit (HOSPITAL_COMMUNITY): Payer: Self-pay | Admitting: Family

## 2013-11-06 ENCOUNTER — Other Ambulatory Visit (HOSPITAL_COMMUNITY): Payer: Self-pay | Admitting: Nurse Practitioner

## 2013-11-06 DIAGNOSIS — Z3689 Encounter for other specified antenatal screening: Secondary | ICD-10-CM

## 2013-12-12 ENCOUNTER — Ambulatory Visit (HOSPITAL_COMMUNITY)
Admission: RE | Admit: 2013-12-12 | Discharge: 2013-12-12 | Disposition: A | Discharge status: Home / Self Care | Payer: Medicaid Other | Source: Ambulatory Visit | Attending: Nurse Practitioner | Admitting: Nurse Practitioner

## 2013-12-12 DIAGNOSIS — Z36 Encounter for antenatal screening of mother: Secondary | ICD-10-CM | POA: Insufficient documentation

## 2013-12-12 DIAGNOSIS — Z3689 Encounter for other specified antenatal screening: Secondary | ICD-10-CM

## 2013-12-24 ENCOUNTER — Encounter (HOSPITAL_COMMUNITY): Payer: Self-pay | Admitting: *Deleted

## 2014-01-06 ENCOUNTER — Inpatient Hospital Stay (HOSPITAL_COMMUNITY)
Admission: AD | Admit: 2014-01-06 | Discharge: 2014-01-07 | Disposition: A | Discharge status: Home / Self Care | Payer: Medicaid Other | Source: Ambulatory Visit | Attending: Obstetrics & Gynecology | Admitting: Obstetrics & Gynecology

## 2014-01-06 DIAGNOSIS — O98813 Other maternal infectious and parasitic diseases complicating pregnancy, third trimester: Secondary | ICD-10-CM | POA: Insufficient documentation

## 2014-01-06 DIAGNOSIS — B373 Candidiasis of vulva and vagina: Secondary | ICD-10-CM | POA: Insufficient documentation

## 2014-01-06 DIAGNOSIS — B3731 Acute candidiasis of vulva and vagina: Secondary | ICD-10-CM

## 2014-01-06 DIAGNOSIS — O4693 Antepartum hemorrhage, unspecified, third trimester: Secondary | ICD-10-CM | POA: Insufficient documentation

## 2014-01-06 DIAGNOSIS — Z87891 Personal history of nicotine dependence: Secondary | ICD-10-CM | POA: Diagnosis not present

## 2014-01-06 DIAGNOSIS — Z3A29 29 weeks gestation of pregnancy: Secondary | ICD-10-CM | POA: Insufficient documentation

## 2014-01-06 HISTORY — DX: Cardiac murmur, unspecified: R01.1

## 2014-01-06 HISTORY — DX: Gastroschisis: Q79.3

## 2014-01-07 ENCOUNTER — Encounter (HOSPITAL_COMMUNITY): Payer: Self-pay

## 2014-01-07 DIAGNOSIS — O4693 Antepartum hemorrhage, unspecified, third trimester: Secondary | ICD-10-CM

## 2014-01-07 LAB — WET PREP, GENITAL
Clue Cells Wet Prep HPF POC: NONE SEEN
TRICH WET PREP: NONE SEEN
YEAST WET PREP: NONE SEEN

## 2014-01-07 LAB — URINALYSIS, ROUTINE W REFLEX MICROSCOPIC
BILIRUBIN URINE: NEGATIVE
Glucose, UA: NEGATIVE mg/dL
HGB URINE DIPSTICK: NEGATIVE
Ketones, ur: NEGATIVE mg/dL
Nitrite: NEGATIVE
PH: 5.5 (ref 5.0–8.0)
Protein, ur: NEGATIVE mg/dL
UROBILINOGEN UA: 0.2 mg/dL (ref 0.0–1.0)

## 2014-01-07 LAB — URINE MICROSCOPIC-ADD ON

## 2014-01-07 MED ORDER — TERCONAZOLE 0.4 % VA CREA: 1.0000 | TOPICAL_CREAM | Freq: Every day | VAGINAL | Status: DC

## 2014-01-07 NOTE — Discharge Instructions (Signed)
Candidal Vulvovaginitis Candidal vulvovaginitis is an infection of the vagina and vulva. The vulva is the skin around the opening of the vagina. This may cause itching and discomfort in and around the vagina.  HOME CARE  Only take medicine as told by your doctor.  Do not have sex (intercourse) until the infection is healed or as told by your doctor.  Practice safe sex.  Tell your sex partner about your infection.  Do not douche or use tampons.  Wear cotton underwear. Do not wear tight pants or panty hose.  Eat yogurt. This may help treat and prevent yeast infections. GET HELP RIGHT AWAY IF:   You have a fever.  Your problems get worse during treatment or do not get better in 3 days.  You have discomfort, irritation, or itching in your vagina or vulva area.  You have pain after sex.  You start to get belly (abdominal) pain. MAKE SURE YOU:  Understand these instructions.  Will watch your condition.  Will get help right away if you are not doing well or get worse. Document Released: 05/07/2008 Document Revised: 02/13/2013 Document Reviewed: 05/07/2008 [facility] Patient Information 2015 [place], [place]. This information is not intended to replace advice given to you by your health care provider. Make sure you discuss any questions you have with your health care provider.   Pelvic Rest Pelvic rest is sometimes recommended for women when:   The placenta is partially or completely covering the opening of the cervix (placenta previa).  There is bleeding between the uterine wall and the amniotic sac in the first trimester (subchorionic hemorrhage).  The cervix begins to open without labor starting (incompetent cervix, cervical insufficiency).  The labor is too early (preterm labor). HOME CARE INSTRUCTIONS  Do not have sexual intercourse, stimulation, or an orgasm.  Do not use tampons, douche, or put anything in the vagina.  Do not lift anything over 10 pounds (4.5  kg).  Avoid strenuous activity or straining your pelvic muscles. SEEK MEDICAL CARE IF:  You have any vaginal bleeding during pregnancy. Treat this as a potential emergency.  You have cramping pain felt low in the stomach (stronger than menstrual cramps).  You notice vaginal discharge (watery, mucus, or bloody).  You have a low, dull backache.  There are regular contractions or uterine tightening. SEEK IMMEDIATE MEDICAL CARE IF: You have vaginal bleeding and have placenta previa.  Document Released: 06/05/2010 Document Revised: 05/03/2011 Document Reviewed: 06/05/2010 [facility] Patient Information 2015 [place], [place]. This information is not intended to replace advice given to you by your health care provider. Make sure you discuss any questions you have with your health care provider.  Vaginal Bleeding During Pregnancy, Third Trimester A small amount of bleeding (spotting) from the vagina is relatively common in pregnancy. Various things can cause bleeding or spotting in pregnancy. Sometimes the bleeding is normal and is not a problem. However, bleeding during the third trimester can also be a sign of something serious for the mother and the baby. Be sure to tell your health care provider about any vaginal bleeding right away.  Some possible causes of vaginal bleeding during the third trimester include:   The placenta may be partially or completely covering the opening to the cervix (placenta previa).   The placenta may have separated from the uterus (abruption of the placenta).   There may be an infection or growth on the cervix.   You may be starting labor, called discharging of the mucus plug.   The  placenta may grow into the muscle layer of the uterus (placenta accreta).  HOME CARE INSTRUCTIONS  Watch your condition for any changes. The following actions may help to lessen any discomfort you are feeling:   Follow your health care provider's instructions for limiting your  activity. If your health care provider orders bed rest, you may need to stay in bed and only get up to use the bathroom. However, your health care provider may allow you to continue light activity.  If needed, make plans for someone to help with your regular activities and responsibilities while you are on bed rest.  Keep track of the number of pads you use each day, how often you change pads, and how soaked (saturated) they are. Write this down.  Do not use tampons. Do not douche.  Do not have sexual intercourse or orgasms until approved by your health care provider.  Follow your health care provider's advice about lifting, driving, and physical activities.  If you pass any tissue from your vagina, save the tissue so you can show it to your health care provider.   Only take over-the-counter or prescription medicines as directed by your health care provider.  Do not take aspirin because it can make you bleed.   Keep all follow-up appointments as directed by your health care provider. SEEK MEDICAL CARE IF:  You have any vaginal bleeding during any part of your pregnancy.  You have cramps or labor pains.  You have a fever, not controlled by medicine. SEEK IMMEDIATE MEDICAL CARE IF:   You have severe cramps or pain in your back or belly (abdomen).  You have chills.  You have a gush of fluid from the vagina.  You pass large clots or tissue from your vagina.  Your bleeding increases.  You feel light-headed or weak.  You pass out.  You feel less movement or no movement of the baby.  MAKE SURE YOU:  Understand these instructions.  Will watch your condition.  Will get help right away if you are not doing well or get worse. Document Released: 05/01/2002 Document Revised: 02/13/2013 Document Reviewed: 10/16/2012 [facility] Patient Information 2015 [place], [place]. This information is not intended to replace advice given to you by your health care provider. Make sure you  discuss any questions you have with your health care provider.

## 2014-01-07 NOTE — MAU Provider Note (Signed)
Chief Complaint:  Abdominal Pain and Vaginal Bleeding   First Provider Initiated Contact with Patient 01/07/14 0040     HPI: Jody Palmer is a 23 y.o. G1P0 at [redacted]w[redacted]d who presents to maternity admissions reporting cramping, pelvic pressure since yesterday. Pink spotting on toilet paper x 2 today. On day 2 of Monistat 3 for self-Dx yeast infection. Difficulty inserting applicator. Reports few contractions per day, no leakage of fluid, GI complaints, Urinary complaints, fever, chills. Good fetal movement.   Placenta anterior per anatomy scan. O Pos.   Pregnancy Course: Uncomplicated  Past Medical History: Past Medical History  Diagnosis Date  . SBO (small bowel obstruction)     this was during her first year of life, no problems since  . Asthma   . 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 Gastroschisis     Past obstetric history: OB History  Gravida Para Term Preterm AB SAB TAB Ectopic Multiple Living  1             # Outcome Date GA Lbr Len/2nd Weight Sex Delivery Anes PTL Lv  1 Current               Past Surgical History: Past Surgical History  Procedure Laterality Date  . Abdominal surgery    . Tonsillectomy    . Gastroschisis closure  1991     Family History: History reviewed. No pertinent family history.  Social History: History  Substance Use Topics  . Smoking status: Former Smoker    Types: Cigars    Quit date: 05/06/2013  . Smokeless tobacco: Never Used  . Alcohol Use: No     Comment: occ    Allergies: No Known Allergies  Meds:  Prescriptions prior to admission  Medication Sig Dispense Refill Last Dose  . acetaminophen (TYLENOL) 325 MG tablet Take 650 mg by mouth every 6 (six) hours as needed for moderate pain.   01/06/2014 at 1100  . albuterol (PROVENTIL HFA;VENTOLIN HFA) 108 (90 BASE) MCG/ACT inhaler Inhale 1-2 puffs into the lungs every 6 (six) hours as needed for wheezing or shortness of breath.   Past Month at Unknown time  . miconazole  (MICOTIN) 200 MG vaginal suppository Place 200 mg vaginally at bedtime.   01/06/2014 at 1100  . Prenatal Vit-Fe Fumarate-FA (PRENATAL MULTIVITAMIN) TABS tablet Take 1 tablet by mouth daily at 12 noon.   01/06/2014 at Unknown time    ROS: Pertinent findings in history of present illness.  Physical Exam  Blood pressure 132/82, pulse 101, temperature 98.8 F (37.1 C), temperature source Oral, resp. rate 18, height 5\' 8"  (1.727 m), weight 111.222 kg (245 lb 3.2 oz), last menstrual period 06/23/2013. GENERAL: Well-developed, well-nourished female in no acute distress.  HEENT: normocephalic HEART: normal rate RESP: normal effort ABDOMEN: Soft, non-tender, gravid appropriate for gestational age. Two large, well-healed abd scars  EXTREMITIES: Nontender, no edema NEURO: alert and oriented SPECULUM EXAM: NEFG except for erythema and excoriation. Large amount of thick, white, odorless, curdlike discharge. One tiny spot of blood coming from right vaginla wall. Suspect tiny traumatic injury, possibly from Monistat applicator?, cervix clean, non-friable. No blood coming from os.  Dilation: Closed Effacement (%): Thick Exam by:: Manya Silvas CNM  FHT:  Baseline 140 , moderate variability, no accelerations present, no decelerations Contractions: None   Labs: Results for orders placed or performed during the hospital encounter of 01/06/14 (from the past 24 hour(s))  Urinalysis, Routine w reflex microscopic  Status: Abnormal   Collection Time: 01/06/14 11:59 PM  Result Value Ref Range   Color, Urine YELLOW YELLOW   APPearance CLEAR CLEAR   Specific Gravity, Urine >1.030 (H) 1.005 - 1.030   pH 5.5 5.0 - 8.0   Glucose, UA NEGATIVE NEGATIVE mg/dL   Hgb urine dipstick NEGATIVE NEGATIVE   Bilirubin Urine NEGATIVE NEGATIVE   Ketones, ur NEGATIVE NEGATIVE mg/dL   Protein, ur NEGATIVE NEGATIVE mg/dL   Urobilinogen, UA 0.2 0.0 - 1.0 mg/dL   Nitrite NEGATIVE NEGATIVE   Leukocytes, UA TRACE (A)  NEGATIVE  Urine microscopic-add on     Status: Abnormal   Collection Time: 01/06/14 11:59 PM  Result Value Ref Range   Squamous Epithelial / LPF FEW (A) RARE   WBC, UA 3-6 <3 WBC/hpf   RBC / HPF 3-6 <3 RBC/hpf   Bacteria, UA FEW (A) RARE    Imaging:   MAU Course:   Assessment: 1. Vaginal bleeding in pregnancy, third trimester--Suspect tiny traumatic injury, possibly from Monistat applicator   2. Candidiasis of vulva and vagina     Plan: Discharge home in stable condition.  Preterm labor precautions and fetal kick counts. Increase fluids and rest Fill Terazol Rx if incomplete resolution of VVC w/ Monistat 3.  Pelvic rest until Sx resolve.      Follow-up Information    Follow up with [facility] [place] On 01/08/2014.   Why:  As scheduled   Contact information:   [address]      Follow up with [place].   Why:  As needed in emergencies   Contact information:   [address] [id number] [place] [phone]       Medication List    TAKE these medications        acetaminophen 325 MG tablet  Commonly known as:  TYLENOL  Take 650 mg by mouth every 6 (six) hours as needed for moderate pain.     albuterol 108 (90 BASE) MCG/ACT inhaler  Commonly known as:  PROVENTIL HFA;VENTOLIN HFA  Inhale 1-2 puffs into the lungs every 6 (six) hours as needed for wheezing or shortness of breath.     miconazole 200 MG vaginal suppository  Commonly known as:  MICOTIN  Place 200 mg vaginally at bedtime.     prenatal multivitamin Tabs tablet  Take 1 tablet by mouth daily at 12 noon.     terconazole 0.4 % vaginal cream  Commonly known as:  TERAZOL 7  Place 1 applicator vaginally at bedtime. x 7 nights        [place], [place] 01/07/2014 1:45 AM

## 2014-01-07 NOTE — MAU Note (Signed)
Lower abdominal pressure & cramping since yesterday. Pink spotting on toilet paper x 2 today. Currently self treating for yeast infection.

## 2014-02-22 NOTE — L&D Delivery Note (Signed)
Delivery Note Progressed to complete and began pushing. Progressed quickly to crowning.  At 5:37 PM a viable and healthy female was delivered via Vaginal, Spontaneous Delivery (Presentation: Right Occiput Anterior).   Initial attempt at delivery of anterior shoulder was unsuccessful. McRoberts employed  But still unsuccessful delivery of shoulder. Suprapubic pressure was applied and we were able to grasp the anterior axilla and deliver the anterior shoulder.   Time was less than a minute   APGAR: 9, 9; weight  .   Placenta status: Intact, Spontaneous.  Cord:  with the following complications: None.    Anesthesia: Epidural  Episiotomy: None Lacerations: 2nd degree Right Periclitoral/periurethral Suture Repair: 3.0 vicryl rapide Est. Blood Loss (mL):  300   Mom to postpartum.  Baby to Couplet care / Skin to Skin   Delivery was accomplished by Dannielle Huh MD with me assisting.    [facility] 03/21/2014, 6:19 PM

## 2014-02-27 LAB — OB RESULTS CONSOLE GBS: STREP GROUP B AG: POSITIVE

## 2014-03-15 ENCOUNTER — Inpatient Hospital Stay (HOSPITAL_COMMUNITY)
Admission: AD | Admit: 2014-03-15 | Discharge: 2014-03-16 | Disposition: A | Discharge status: Home / Self Care | Payer: Medicaid Other | Source: Ambulatory Visit | Attending: Obstetrics & Gynecology | Admitting: Obstetrics & Gynecology

## 2014-03-15 ENCOUNTER — Encounter (HOSPITAL_COMMUNITY): Payer: Self-pay | Admitting: *Deleted

## 2014-03-15 DIAGNOSIS — O26893 Other specified pregnancy related conditions, third trimester: Secondary | ICD-10-CM

## 2014-03-15 DIAGNOSIS — Z87891 Personal history of nicotine dependence: Secondary | ICD-10-CM | POA: Insufficient documentation

## 2014-03-15 DIAGNOSIS — O9989 Other specified diseases and conditions complicating pregnancy, childbirth and the puerperium: Secondary | ICD-10-CM | POA: Insufficient documentation

## 2014-03-15 DIAGNOSIS — Z3A38 38 weeks gestation of pregnancy: Secondary | ICD-10-CM | POA: Insufficient documentation

## 2014-03-15 DIAGNOSIS — N898 Other specified noninflammatory disorders of vagina: Secondary | ICD-10-CM | POA: Insufficient documentation

## 2014-03-15 NOTE — MAU Note (Signed)
Pt c/o green vaginal discharge starting 2 days ago.  Denies vaginal bleeding.  States baby moving a lot.

## 2014-03-16 DIAGNOSIS — N898 Other specified noninflammatory disorders of vagina: Secondary | ICD-10-CM

## 2014-03-16 DIAGNOSIS — Z3A38 38 weeks gestation of pregnancy: Secondary | ICD-10-CM | POA: Diagnosis not present

## 2014-03-16 DIAGNOSIS — O9989 Other specified diseases and conditions complicating pregnancy, childbirth and the puerperium: Secondary | ICD-10-CM | POA: Diagnosis not present

## 2014-03-16 DIAGNOSIS — O26893 Other specified pregnancy related conditions, third trimester: Secondary | ICD-10-CM

## 2014-03-16 DIAGNOSIS — Z87891 Personal history of nicotine dependence: Secondary | ICD-10-CM | POA: Diagnosis not present

## 2014-03-16 NOTE — MAU Provider Note (Signed)
History  Chief Complaint:  Vaginal Discharge  Jody Palmer is a 24 y.o. G1P0 female at [redacted]w[redacted]d presenting with vaginal discharge x 2 days that she describes as yellowish mucous. Believes that it may be her mucous plug. Reports active fetal movement, contractions: none, vaginal bleeding: none, membranes: intact. Denies uti s/s, abnormal/malodorous vag d/c or vulvovaginal itching/irritation.   [facility]  Next visit Tuesday. Pregnancy complicated by none.  Obstetrical History: OB History    Gravida Para Term Preterm AB TAB SAB Ectopic Multiple Living   1               Past Medical History: Past Medical History  Diagnosis Date  . SBO (small bowel obstruction)     this was during her first year of life, no problems since  . Asthma   . 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 Gastroschisis     Past Surgical History: Past Surgical History  Procedure Laterality Date  . Abdominal surgery    . Tonsillectomy    . Gastroschisis closure  1991    Social History: History   Social History  . Marital Status: Single    Spouse Name: N/A    Number of Children: N/A  . Years of Education: N/A   Social History Main Topics  . Smoking status: Former Smoker    Types: Cigars    Quit date: 05/06/2013  . Smokeless tobacco: Never Used  . Alcohol Use: No     Comment: occ  . Drug Use: No     Comment: last use May 2015  . Sexual Activity: Yes    Birth Control/ Protection: None     Comment: last sex June 12   Other Topics Concern  . None   Social History Narrative    Allergies: No Known Allergies  Prescriptions prior to admission  Medication Sig Dispense Refill Last Dose  . acetaminophen (TYLENOL) 325 MG tablet Take 650 mg by mouth every 6 (six) hours as needed for moderate pain.   Past Month at Unknown time  . Prenatal Vit-Fe Fumarate-FA (PRENATAL MULTIVITAMIN) TABS tablet Take 1 tablet by mouth daily at 12 noon.   03/15/2014 at Unknown time  .  terconazole (TERAZOL 7) 0.4 % vaginal cream Place 1 applicator vaginally at bedtime. x 7 nights 45 g 0 03/14/2014 at Unknown time  . albuterol (PROVENTIL HFA;VENTOLIN HFA) 108 (90 BASE) MCG/ACT inhaler Inhale 1-2 puffs into the lungs every 6 (six) hours as needed for wheezing or shortness of breath.   More than a month at Unknown time  . miconazole (MICOTIN) 200 MG vaginal suppository Place 200 mg vaginally at bedtime.   01/06/2014 at 1100    Review of Systems  Pertinent pos/neg as indicated in HPI  Physical Exam  Blood pressure 138/89, pulse 91, temperature 98 F (36.7 C), temperature source Oral, resp. rate 16, height 5\' 8"  (1.727 m), weight 114.76 kg (253 lb), last menstrual period 06/23/2013. General appearance: alert, cooperative and no distress Lungs: normal effort Heart: regular rate  Abdomen: gravid, soft, non-tender Extremities: no edema   Spec exam: no pooling noted, fern negative Cultures/Specimens: none    Presentation: cephalic  Fetal monitoring: FHR: 140 bpm, variability: moderate,  Accelerations: Present,  decelerations:  Absent Uterine activity: no contractions noted  MAU Course    Labs:  No results found for this or any previous visit (from the past 24 hour(s)).  Assessment and Plan  A:  [redacted]w[redacted]d  SIUP  G1P0  No contractions  Cat 1 FHR  Normal discharge in pregnancy P:  Discharge to home  Reviewed s/s labor, 5/1/1  [facility] as scheduled   Ross,Lisa Wynne SNM 1/23/201612:17 AM  I have seen and examined this patient and I agree with the above. Serita Grammes CNM 4:40 AM 03/16/2014

## 2014-03-16 NOTE — Discharge Instructions (Signed)
Braxton Hicks Contractions °Contractions of the uterus can occur throughout pregnancy. Contractions are not always a sign that you are in labor.  °WHAT ARE BRAXTON HICKS CONTRACTIONS?  °Contractions that occur before labor are called Braxton Hicks contractions, or false labor. Toward the end of pregnancy (32-34 weeks), these contractions can develop more often and may become more forceful. This is not true labor because these contractions do not result in opening (dilatation) and thinning of the cervix. They are sometimes difficult to tell apart from true labor because these contractions can be forceful and people have different pain tolerances. You should not feel embarrassed if you go to the hospital with false labor. Sometimes, the only way to tell if you are in true labor is for your health care provider to look for changes in the cervix. °If there are no prenatal problems or other health problems associated with the pregnancy, it is completely safe to be sent home with false labor and await the onset of true labor. °HOW CAN YOU TELL THE DIFFERENCE BETWEEN TRUE AND FALSE LABOR? °False Labor °· The contractions of false labor are usually shorter and not as hard as those of true labor.   °· The contractions are usually irregular.   °· The contractions are often felt in the front of the lower abdomen and in the groin.   °· The contractions may go away when you walk around or change positions while lying down.   °· The contractions get weaker and are shorter lasting as time goes on.   °· The contractions do not usually become progressively stronger, regular, and closer together as with true labor.   °True Labor °· Contractions in true labor last 30-70 seconds, become very regular, usually become more intense, and increase in frequency.   °· The contractions do not go away with walking.   °· The discomfort is usually felt in the top of the uterus and spreads to the lower abdomen and low back.   °· True labor can be  determined by your health care provider with an exam. This will show that the cervix is dilating and getting thinner.   °WHAT TO REMEMBER °· Keep up with your usual exercises and follow other instructions given by your health care provider.   °· Take medicines as directed by your health care provider.   °· Keep your regular prenatal appointments.   °· Eat and drink lightly if you think you are going into labor.   °· If Braxton Hicks contractions are making you uncomfortable:   °¨ Change your position from lying down or resting to walking, or from walking to resting.   °¨ Sit and rest in a tub of warm water.   °¨ Drink 2-3 glasses of water. Dehydration may cause these contractions.   °¨ Do slow and deep breathing several times an hour.   °WHEN SHOULD I SEEK IMMEDIATE MEDICAL CARE? °Seek immediate medical care if: °· Your contractions become stronger, more regular, and closer together.   °· You have fluid leaking or gushing from your vagina.   °· You have a fever.   °· You pass blood-tinged mucus.   °· You have vaginal bleeding.   °· You have continuous abdominal pain.   °· You have low back pain that you never had before.   °· You feel your baby's head pushing down and causing pelvic pressure.   °· Your baby is not moving as much as it used to.   °Document Released: 02/08/2005 Document Revised: 02/13/2013 Document Reviewed: 11/20/2012 °ExitCare® Patient Information ©2015 [employer]. This information is not intended to replace advice given to you by your health care   provider. Make sure you discuss any questions you have with your health care provider. ° °

## 2014-03-21 ENCOUNTER — Encounter (HOSPITAL_COMMUNITY): Payer: Self-pay

## 2014-03-21 ENCOUNTER — Inpatient Hospital Stay (HOSPITAL_COMMUNITY)
Admission: AD | Admit: 2014-03-21 | Discharge: 2014-03-23 | DRG: 775 | Disposition: A | Discharge status: Home / Self Care | Payer: Medicaid Other | Source: Ambulatory Visit | Attending: Obstetrics & Gynecology | Admitting: Obstetrics & Gynecology

## 2014-03-21 ENCOUNTER — Inpatient Hospital Stay (HOSPITAL_COMMUNITY): Payer: Medicaid Other | Admitting: Anesthesiology

## 2014-03-21 DIAGNOSIS — Z3A39 39 weeks gestation of pregnancy: Secondary | ICD-10-CM | POA: Diagnosis present

## 2014-03-21 DIAGNOSIS — O99824 Streptococcus B carrier state complicating childbirth: Secondary | ICD-10-CM | POA: Diagnosis present

## 2014-03-21 DIAGNOSIS — Z833 Family history of diabetes mellitus: Secondary | ICD-10-CM

## 2014-03-21 DIAGNOSIS — Z87891 Personal history of nicotine dependence: Secondary | ICD-10-CM | POA: Diagnosis not present

## 2014-03-21 DIAGNOSIS — Z8249 Family history of ischemic heart disease and other diseases of the circulatory system: Secondary | ICD-10-CM

## 2014-03-21 DIAGNOSIS — O133 Gestational [pregnancy-induced] hypertension without significant proteinuria, third trimester: Principal | ICD-10-CM | POA: Diagnosis present

## 2014-03-21 LAB — COMPREHENSIVE METABOLIC PANEL
ALK PHOS: 118 U/L — AB (ref 39–117)
ALT: 9 U/L (ref 0–35)
AST: 17 U/L (ref 0–37)
Albumin: 3 g/dL — ABNORMAL LOW (ref 3.5–5.2)
Anion gap: 5 (ref 5–15)
BILIRUBIN TOTAL: 0.2 mg/dL — AB (ref 0.3–1.2)
BUN: 12 mg/dL (ref 6–23)
CALCIUM: 8.4 mg/dL (ref 8.4–10.5)
CO2: 23 mmol/L (ref 19–32)
CREATININE: 0.52 mg/dL (ref 0.50–1.10)
Chloride: 105 mmol/L (ref 96–112)
GFR calc Af Amer: >90 mL/min (ref >90)
GFR calc non Af Amer: >90 mL/min (ref >90)
GLUCOSE: 102 mg/dL — AB (ref 70–99)
POTASSIUM: 3.6 mmol/L (ref 3.5–5.1)
Sodium: 133 mmol/L — ABNORMAL LOW (ref 135–145)
TOTAL PROTEIN: 7.2 g/dL (ref 6.0–8.3)

## 2014-03-21 LAB — CBC
HCT: 34.7 % — ABNORMAL LOW (ref 36.0–46.0)
HEMATOCRIT: 33.2 % — AB (ref 36.0–46.0)
Hemoglobin: 12.3 g/dL (ref 12.0–15.0)
Hemoglobin: 11.7 g/dL — ABNORMAL LOW (ref 12.0–15.0)
MCH: 30.1 pg (ref 26.0–34.0)
MCH: 30.2 pg (ref 26.0–34.0)
MCHC: 35.4 g/dL (ref 30.0–36.0)
MCHC: 35.2 g/dL (ref 30.0–36.0)
MCV: 84.8 fL (ref 78.0–100.0)
MCV: 85.8 fL (ref 78.0–100.0)
PLATELETS: 237 10*3/uL (ref 150–400)
PLATELETS: 257 10*3/uL (ref 150–400)
RBC: 4.09 MIL/uL (ref 3.87–5.11)
RBC: 3.87 MIL/uL (ref 3.87–5.11)
RDW: 14.1 % (ref 11.5–15.5)
RDW: 14.3 % (ref 11.5–15.5)
WBC: 7.9 10*3/uL (ref 4.0–10.5)
WBC: 13.4 10*3/uL — ABNORMAL HIGH (ref 4.0–10.5)

## 2014-03-21 LAB — TYPE AND SCREEN
ABO/RH(D): O POS
Antibody Screen: NEGATIVE

## 2014-03-21 LAB — PROTEIN / CREATININE RATIO, URINE
CREATININE, URINE: 280 mg/dL
Protein Creatinine Ratio: 0.1 (ref 0.00–0.15)
Total Protein, Urine: 28 mg/dL

## 2014-03-21 LAB — RAPID HIV SCREEN (WH-MAU): Rapid HIV Screen: NONREACTIVE

## 2014-03-21 MED ORDER — OXYTOCIN 40 UNITS IN LACTATED RINGERS INFUSION - SIMPLE MED: 1.0000 m[IU]/min | INTRAVENOUS | Status: DC

## 2014-03-21 MED ORDER — OXYCODONE-ACETAMINOPHEN 5-325 MG PO TABS: 2.0000 | ORAL_TABLET | ORAL | Status: DC | PRN

## 2014-03-21 MED ORDER — ZOLPIDEM TARTRATE 5 MG PO TABS
5.0000 mg | ORAL_TABLET | Freq: Every evening | ORAL | Status: DC | PRN
Start: 2014-03-21 — End: 2014-03-23

## 2014-03-21 MED ORDER — FENTANYL 2.5 MCG/ML BUPIVACAINE 1/10 % EPIDURAL INFUSION (WH - ANES)
14.0000 mL/h | INTRAMUSCULAR | Status: DC | PRN
  Administered 2014-03-21: 14 mL/h via EPIDURAL
  Filled 2014-03-21: qty 125

## 2014-03-21 MED ORDER — FENTANYL CITRATE 0.05 MG/ML IJ SOLN: 100.0000 ug | INTRAMUSCULAR | Status: DC | PRN

## 2014-03-21 MED ORDER — LACTATED RINGERS IV SOLN
INTRAVENOUS | Status: DC
  Administered 2014-03-21: 15:00:00 via INTRAVENOUS

## 2014-03-21 MED ORDER — FENTANYL 2.5 MCG/ML BUPIVACAINE 1/10 % EPIDURAL INFUSION (WH - ANES)
INTRAMUSCULAR | Status: DC | PRN
  Administered 2014-03-21: 14 mL/h via EPIDURAL

## 2014-03-21 MED ORDER — OXYCODONE-ACETAMINOPHEN 5-325 MG PO TABS
1.0000 | ORAL_TABLET | ORAL | Status: DC | PRN
  Filled 2014-03-21: qty 1

## 2014-03-21 MED ORDER — SIMETHICONE 80 MG PO CHEW
80.0000 mg | CHEWABLE_TABLET | ORAL | Status: DC | PRN
Start: 2014-03-21 — End: 2014-03-23

## 2014-03-21 MED ORDER — PENICILLIN G POTASSIUM 5000000 UNITS IJ SOLR
2.5000 10*6.[IU] | INTRAVENOUS | Status: DC
  Administered 2014-03-21: 2.5 10*6.[IU] via INTRAVENOUS
  Filled 2014-03-21 (×4): qty 2.5

## 2014-03-21 MED ORDER — FENTANYL CITRATE 0.05 MG/ML IJ SOLN
100.0000 ug | Freq: Once | INTRAMUSCULAR | Status: AC
  Administered 2014-03-21: 100 ug via INTRAVENOUS
  Filled 2014-03-21: qty 2

## 2014-03-21 MED ORDER — DIBUCAINE 1 % RE OINT
1.0000 "application " | TOPICAL_OINTMENT | RECTAL | Status: DC | PRN
  Administered 2014-03-23: 1 via RECTAL
  Filled 2014-03-21: qty 28

## 2014-03-21 MED ORDER — EPHEDRINE 5 MG/ML INJ
10.0000 mg | INTRAVENOUS | Status: DC | PRN
  Filled 2014-03-21: qty 2

## 2014-03-21 MED ORDER — IBUPROFEN 600 MG PO TABS
600.0000 mg | ORAL_TABLET | Freq: Four times a day (QID) | ORAL | Status: DC
  Administered 2014-03-22 – 2014-03-23 (×6): 600 mg via ORAL
  Filled 2014-03-21 (×6): qty 1

## 2014-03-21 MED ORDER — PHENYLEPHRINE 40 MCG/ML (10ML) SYRINGE FOR IV PUSH (FOR BLOOD PRESSURE SUPPORT)
80.0000 ug | PREFILLED_SYRINGE | INTRAVENOUS | Status: DC | PRN
  Filled 2014-03-21: qty 2

## 2014-03-21 MED ORDER — DIPHENHYDRAMINE HCL 50 MG/ML IJ SOLN: 12.5000 mg | INTRAMUSCULAR | Status: DC | PRN

## 2014-03-21 MED ORDER — PHENYLEPHRINE 40 MCG/ML (10ML) SYRINGE FOR IV PUSH (FOR BLOOD PRESSURE SUPPORT)
80.0000 ug | PREFILLED_SYRINGE | INTRAVENOUS | Status: DC | PRN
  Filled 2014-03-21: qty 20
  Filled 2014-03-21: qty 2

## 2014-03-21 MED ORDER — TETANUS-DIPHTH-ACELL PERTUSSIS 5-2.5-18.5 LF-MCG/0.5 IM SUSP: 0.5000 mL | Freq: Once | INTRAMUSCULAR | Status: DC

## 2014-03-21 MED ORDER — TERBUTALINE SULFATE 1 MG/ML IJ SOLN
0.2500 mg | Freq: Once | INTRAMUSCULAR | Status: DC | PRN
  Filled 2014-03-21: qty 1

## 2014-03-21 MED ORDER — BENZOCAINE-MENTHOL 20-0.5 % EX AERO
1.0000 "application " | INHALATION_SPRAY | CUTANEOUS | Status: DC | PRN
  Administered 2014-03-22 – 2014-03-23 (×2): 1 via TOPICAL
  Filled 2014-03-21 (×2): qty 56

## 2014-03-21 MED ORDER — OXYCODONE-ACETAMINOPHEN 5-325 MG PO TABS: 1.0000 | ORAL_TABLET | ORAL | Status: DC | PRN

## 2014-03-21 MED ORDER — WITCH HAZEL-GLYCERIN EX PADS
1.0000 "application " | MEDICATED_PAD | CUTANEOUS | Status: DC | PRN
  Administered 2014-03-23: 1 via TOPICAL

## 2014-03-21 MED ORDER — LIDOCAINE HCL (PF) 1 % IJ SOLN
INTRAMUSCULAR | Status: DC | PRN
  Administered 2014-03-21 (×2): 4 mL

## 2014-03-21 MED ORDER — ONDANSETRON HCL 4 MG/2ML IJ SOLN: 4.0000 mg | INTRAMUSCULAR | Status: DC | PRN

## 2014-03-21 MED ORDER — FLEET ENEMA 7-19 GM/118ML RE ENEM: 1.0000 | ENEMA | RECTAL | Status: DC | PRN

## 2014-03-21 MED ORDER — LIDOCAINE HCL (PF) 1 % IJ SOLN
30.0000 mL | INTRAMUSCULAR | Status: DC | PRN
  Filled 2014-03-21: qty 30

## 2014-03-21 MED ORDER — LANOLIN HYDROUS EX OINT: TOPICAL_OINTMENT | CUTANEOUS | Status: DC | PRN

## 2014-03-21 MED ORDER — CITRIC ACID-SODIUM CITRATE 334-500 MG/5ML PO SOLN
30.0000 mL | ORAL | Status: DC | PRN
  Administered 2014-03-21: 30 mL via ORAL
  Filled 2014-03-21: qty 15

## 2014-03-21 MED ORDER — ACETAMINOPHEN 325 MG PO TABS: 650.0000 mg | ORAL_TABLET | ORAL | Status: DC | PRN

## 2014-03-21 MED ORDER — OXYTOCIN BOLUS FROM INFUSION: 500.0000 mL | INTRAVENOUS | Status: DC

## 2014-03-21 MED ORDER — PENICILLIN G POTASSIUM 5000000 UNITS IJ SOLR
5.0000 10*6.[IU] | Freq: Once | INTRAVENOUS | Status: AC
  Administered 2014-03-21: 5 10*6.[IU] via INTRAVENOUS
  Filled 2014-03-21: qty 5

## 2014-03-21 MED ORDER — LACTATED RINGERS IV SOLN: 500.0000 mL | Freq: Once | INTRAVENOUS | Status: DC

## 2014-03-21 MED ORDER — LACTATED RINGERS IV SOLN: 500.0000 mL | INTRAVENOUS | Status: DC | PRN

## 2014-03-21 MED ORDER — SENNOSIDES-DOCUSATE SODIUM 8.6-50 MG PO TABS
2.0000 | ORAL_TABLET | ORAL | Status: DC
  Administered 2014-03-22: 2 via ORAL
  Filled 2014-03-21 (×2): qty 2

## 2014-03-21 MED ORDER — ONDANSETRON HCL 4 MG/2ML IJ SOLN: 4.0000 mg | Freq: Four times a day (QID) | INTRAMUSCULAR | Status: DC | PRN

## 2014-03-21 MED ORDER — ONDANSETRON HCL 4 MG PO TABS: 4.0000 mg | ORAL_TABLET | ORAL | Status: DC | PRN

## 2014-03-21 MED ORDER — DIPHENHYDRAMINE HCL 25 MG PO CAPS: 25.0000 mg | ORAL_CAPSULE | Freq: Four times a day (QID) | ORAL | Status: DC | PRN

## 2014-03-21 MED ORDER — OXYTOCIN 40 UNITS IN LACTATED RINGERS INFUSION - SIMPLE MED
62.5000 mL/h | INTRAVENOUS | Status: DC
  Administered 2014-03-21: 62.5 mL/h via INTRAVENOUS
  Filled 2014-03-21: qty 1000

## 2014-03-21 MED ORDER — PRENATAL MULTIVITAMIN CH
1.0000 | ORAL_TABLET | Freq: Every day | ORAL | Status: DC
  Administered 2014-03-22: 1 via ORAL
  Filled 2014-03-21: qty 1

## 2014-03-21 NOTE — Anesthesia Procedure Notes (Signed)
Epidural Patient location during procedure: OB Start time: 03/21/2014 12:45 PM End time: 03/21/2014 12:53 PM  Staffing Anesthesiologist: Milana Obey Performed by: anesthesiologist   Preanesthetic Checklist Completed: patient identified, site marked, surgical consent, pre-op evaluation, timeout performed, IV checked, risks and benefits discussed and monitors and equipment checked  Epidural Patient position: sitting Prep: site prepped and draped and DuraPrep Patient monitoring: continuous pulse ox and blood pressure Approach: midline Location: L3-L4 Injection technique: LOR saline  Needle:  Needle type: Tuohy  Needle gauge: 17 G Needle length: 9 cm and 9 Needle insertion depth: 7 cm Catheter type: closed end flexible Catheter size: 19 Gauge Catheter at skin depth: 12 cm Test dose: negative  Assessment Events: blood not aspirated, injection not painful, no injection resistance, negative IV test and no paresthesia  Additional Notes Patient identified. Risks/Benefits/Options discussed with patient including but not limited to bleeding, infection, nerve damage, paralysis, failed block, incomplete pain control, headache, blood pressure changes, nausea, vomiting, reactions to medication both or allergic, itching and postpartum back pain. Confirmed with bedside nurse the patient's most recent platelet count. Confirmed with patient that they are not currently taking any anticoagulation, have any bleeding history or any family history of bleeding disorders. Patient expressed understanding and wished to proceed. All questions were answered. Sterile technique was used throughout the entire procedure. Please see nursing notes for vital signs. Test dose was given through epidural catheter and negative prior to continuing to dose epidural or start infusion. Warning signs of high block given to the patient including shortness of breath, tingling/numbness in hands, complete motor block, or any  concerning symptoms with instructions to call for help. Patient was given instructions on fall risk and not to get out of bed. All questions and concerns addressed with instructions to call with any issues or inadequate analgesia.

## 2014-03-21 NOTE — H&P (Signed)
Jody Palmer is a 24 y.o. female presenting for contractions while being observed in MAU for hypertension. Decision made to induce by Dr Harolyn Rutherford.  Rn Note: Having contractions every 4-5 min for the past 2 hours. 1 to 2 cm at last dr visit. Had blood tinge mucus sm amt 2 hours ago. Baby moving well. No leaking. . Maternal Medical History:  Reason for admission: Nausea. Gestational hypertension  Contractions: Frequency: irregular.   Perceived severity is mild.    Prenatal complications: Placental abnormality.     OB History    Gravida Para Term Preterm AB TAB SAB Ectopic Multiple Living   1              Past Medical History  Diagnosis Date  . SBO (small bowel obstruction)     this was during her first year of life, no problems since  . Asthma   . 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 Gastroschisis    Past Surgical History  Procedure Laterality Date  . Abdominal surgery    . Tonsillectomy    . Gastroschisis closure  72   Family History: family history includes Diabetes in her father; Hypertension in her father. Social History:  reports that she quit smoking about 10 months ago. Her smoking use included Cigars. She has never used smokeless tobacco. She reports that she uses illicit drugs (Marijuana). She reports that she does not drink alcohol.      Review of Systems  Eyes: Negative for blurred vision.  Respiratory: Negative.   Cardiovascular: Negative.   Gastrointestinal: Positive for abdominal pain. Negative for nausea and vomiting.       Contractions  Neurological: Negative for headaches.    Dilation: 1.5 Effacement (%): 80 Station: -2 Exam by:: DCALLAWAY, RN Blood pressure 133/76, pulse 86, temperature 98 F (36.7 C), temperature source Oral, resp. rate 22, last menstrual period 06/23/2013.   Repeat Exam 1/90/-3 Ballottable  Maternal Exam:  Uterine Assessment: Contraction strength is moderate.  Contraction frequency is irregular.    Abdomen: Patient reports no abdominal tenderness. Estimated fetal weight is 7.5 - 8 lbs.   Fetal presentation: vertex  Introitus: Normal vulva. Normal vagina.  Ferning test: not done.  Nitrazine test: not done. Amniotic fluid character: not assessed.  Pelvis: adequate for delivery.   Cervix: Cervix evaluated by digital exam.     Fetal Exam Fetal Monitor Review: Mode: ultrasound.   Baseline rate: 135.  Pattern: accelerations present and no decelerations.    Fetal State Assessment: Category I - tracings are normal.     Physical Exam  Constitutional: She is oriented to person, place, and time. She appears well-developed and well-nourished.  HENT:  Head: Normocephalic and atraumatic.  Neck: Normal range of motion.  Cardiovascular: Normal rate.   Respiratory: Effort normal and breath sounds normal.  GI: Soft.  Genitourinary: Vagina normal.  Musculoskeletal: She exhibits edema (trace).  Neurological: She is alert and oriented to person, place, and time. She has normal reflexes. She displays normal reflexes. She exhibits normal muscle tone.  Skin: Skin is warm and dry.  Psychiatric: She has a normal mood and affect. Her behavior is normal. Judgment and thought content normal.    Prenatal labs: ABO, Rh: O/Positive/-- (07/09 0000) Antibody: Negative (07/09 0000) Rubella: Immune (07/09 0000) RPR: Nonreactive (07/09 0000)  HBsAg: Negative (07/09 0000)  HIV: Non-reactive (07/09 0000)  GBS: Positive (01/06 0000)   Assessment/Plan: A: IUP @ 39+3 Gestational Hypertension  P: Induction  of Labor secondary to Gestational Hypertension     Foley/Pitocin Induction  Clemmons,Lori Grissett 03/21/2014, 9:58 AM

## 2014-03-21 NOTE — Lactation Note (Signed)
This note was copied from the chart of Jody Palmer. Lactation Consultation Note  Patient Name: Jody Palmer Date: 03/21/2014 Reason for consult: Initial assessment of this mom and baby at 3 hours pp.  This is mom's first baby and she has been latching well since delivery.  LC arrives to see baby well-latched to (L) breast in football position.  Rhythmical sucking bursts noted but only a few very quiet swallows heard.  Baby had already nursed on other breast for 20 minutes and mom says her nurse has shown her hand expression.  LC encouraged frequent STS and cue feedings.  Mom on [facility] and wants to pump later, so will contact [place] to see if pump available.  Options for pump rental or [facility] loaners also discussed but [place] reminds parents that while baby is learning to breastfeed and mom is building milk supply, direct breastfeeding is the best possible stimulation.  Mom encouraged to feed baby 8-12 times/24 hours and with feeding cues. LC encouraged review of Baby and Me pp 9, 14 and 20-25 for STS and BF information. LC provided [employer] brochure and reviewed [facility] services and list of community and web site resources.    Maternal Data Formula Feeding for Exclusion: No Has patient been taught Hand Expression?: Yes (RN documented at recent feeding) Does the patient have breastfeeding experience prior to this delivery?: No  Feeding Feeding Type: Breast Fed Length of feed: 20 min  LATCH Score/Interventions Latch: Grasps breast easily, tongue down, lips flanged, rhythmical sucking.  Audible Swallowing: A few with stimulation Intervention(s): Skin to skin  Type of Nipple: Everted at rest and after stimulation  Comfort (Breast/Nipple): Soft / non-tender     Hold (Positioning): Assistance needed to correctly position infant at breast and maintain latch. Intervention(s): Breastfeeding basics reviewed;Support Pillows;Skin to skin  LATCH Score: 8 (recent LATCH assessment, per  RN)  Lactation Tools Discussed/Used WIC Program: Yes STS, cue feedings, hand expression  Consult Status Consult Status: Follow-up Date: 03/22/14 Follow-up type: In-patient    Jody Palmer [facility] 03/21/2014, 9:22 PM

## 2014-03-21 NOTE — Progress Notes (Signed)
Jody Palmer is a 24 y.o. G1P0 at [redacted]w[redacted]d by ultrasound admitted for Gest hypertension  Subjective: Feeling some pain with UCs. Better with Fentanyl  Objective: BP 125/80 mmHg  Pulse 87  Temp(Src) 98.6 F (37 C) (Oral)  Resp 18  Ht 5\' 8"  (1.727 m)  Wt 255 lb (115.667 kg)  BMI 38.78 kg/m2  LMP 06/23/2013      FHT:  FHR: 135 bpm, variability: moderate,  accelerations:  Present,  decelerations:  Absent UC:   irregular, every 4 minutes SVE:   Dilation: 1 Effacement (%): 90 Station: -2 Exam by:: Clemmons  Labs: Lab Results  Component Value Date   WBC 7.9 03/21/2014   HGB 12.3 03/21/2014   HCT 34.7* 03/21/2014   MCV 84.8 03/21/2014   PLT 237 03/21/2014    Assessment / Plan: Induction of labor due to gestational hypertension,  progressing well on pitocin  Labor: Foley inserted. Will start Pitocin Preeclampsia:  labs stable Fetal Wellbeing:  Category I Pain Control:  Fentanyl I/D:  n/a Anticipated MOD:  NSVD  [name] 03/21/2014, 11:56 AM

## 2014-03-21 NOTE — Anesthesia Preprocedure Evaluation (Signed)
Anesthesia Evaluation  Patient identified by MRN, date of birth, ID band Patient awake    Reviewed: Allergy & Precautions, NPO status , Patient's Chart, lab work & pertinent test results  History of Anesthesia Complications Negative for: history of anesthetic complications  Airway Mallampati: III  TM Distance: >3 FB Neck ROM: Full    Dental no notable dental hx. (+) Dental Advisory Given   Pulmonary asthma , former smoker,  breath sounds clear to auscultation  Pulmonary exam normal       Cardiovascular + Valvular Problems/Murmurs Rhythm:Regular Rate:Normal     Neuro/Psych negative neurological ROS  negative psych ROS   GI/Hepatic negative GI ROS, Neg liver ROS,   Endo/Other  negative endocrine ROS  Renal/GU negative Renal ROS  negative genitourinary   Musculoskeletal negative musculoskeletal ROS (+)   Abdominal   Peds negative pediatric ROS (+)  Hematology negative hematology ROS (+)   Anesthesia Other Findings   Reproductive/Obstetrics (+) Pregnancy                             Anesthesia Physical Anesthesia Plan  ASA: II  Anesthesia Plan: Epidural   Post-op Pain Management:    Induction:   Airway Management Planned:   Additional Equipment:   Intra-op Plan:   Post-operative Plan:   Informed Consent: I have reviewed the patients History and Physical, chart, labs and discussed the procedure including the risks, benefits and alternatives for the proposed anesthesia with the patient or authorized representative who has indicated his/her understanding and acceptance.   Dental advisory given  Plan Discussed with: CRNA  Anesthesia Plan Comments:         Anesthesia Quick Evaluation

## 2014-03-21 NOTE — MAU Note (Signed)
Having contractions every 4-5 min for the past 2 hours.  1 to 2 cm at last dr visit.  Had blood tinge mucus sm amt 2 hours ago.  Baby moving well. No leaking.

## 2014-03-22 LAB — RPR: RPR Ser Ql: NONREACTIVE

## 2014-03-22 NOTE — Progress Notes (Signed)
Ur chart review completed.  

## 2014-03-22 NOTE — Anesthesia Postprocedure Evaluation (Signed)
Anesthesia Post Note  Patient: Jody Palmer  Procedure(s) Performed: * No procedures listed *  Anesthesia type: Epidural  Patient location: Mother/Baby  Post pain: Pain level controlled  Post assessment: Post-op Vital signs reviewed  Last Vitals:  Filed Vitals:   03/22/14 0056  BP: 126/57  Pulse: 91  Temp: 36.9 C  Resp:     Post vital signs: Reviewed  Level of consciousness:alert  Complications: No apparent anesthesia complications

## 2014-03-22 NOTE — Progress Notes (Signed)
Post Partum Day 1 Subjective: no complaints, up ad lib, voiding and tolerating PO, small lochia, plans to breastfeed, condoms for contraception  Discussed Plan B  Objective: Blood pressure 126/57, pulse 91, temperature 98.5 F (36.9 C), temperature source Oral, resp. rate 18, height 5\' 8"  (1.727 m), weight 115.667 kg (255 lb), last menstrual period 06/23/2013, SpO2 100 %, unknown if currently breastfeeding.  Physical Exam:  General: alert, cooperative and no distress Lochia:normal flow Chest: CTAB Heart: RRR no m/r/g Abdomen: +BS, soft, nontender,  Uterine Fundus: firm DVT Evaluation: No evidence of DVT seen on physical exam. Extremities: no edema   Recent Labs  03/21/14 0745 03/21/14 1903  HGB 12.3 11.7*  HCT 34.7* 33.2*    Assessment/Plan:wan Plan for discharge tomorrow and Lactation consult   LOS: 1 day   CRESENZO-DISHMAN,Lilya Smitherman 03/22/2014, 7:23 AM

## 2014-03-22 NOTE — Lactation Note (Signed)
This note was copied from the chart of Girl Shanecia Hoganson. Lactation Consultation Note  Patient Name: Girl Valerye Kobus [id number] Date: 03/22/2014 Reason for consult: Follow-up assessment   Follow-up at 58 hours old; infant has breastfed x10 (15-30 min); voids-2; stools-3 since birth.  Mom reports breastfeeding well and denies any nipple soreness or pain.  Reports feeding with cues.  Reviewed cluster feeding and size of infant's stomach.  Encouraged to continue exclusively breastfeeding.  Mom asked about a hand pump for discharge; has [place].  Gave hand pump and shown how to use.  Engorgement prevention discussed for when her mature milk comes-in.  Appropriate questions asked.  Encouraged to call for assistance as needed.    Maternal Data    Feeding Feeding Type: Breast Fed Length of feed: 10 min  LATCH Score/Interventions Latch: Grasps breast easily, tongue down, lips flanged, rhythmical sucking.  Audible Swallowing: A few with stimulation Intervention(s): Hand expression;Skin to skin  Type of Nipple: Everted at rest and after stimulation  Comfort (Breast/Nipple): Soft / non-tender     Hold (Positioning): No assistance needed to correctly position infant at breast. Intervention(s): Breastfeeding basics reviewed;Support Pillows  LATCH Score: 9  Lactation Tools Discussed/Used     Consult Status Consult Status: PRN    Merlene Laughter 03/22/2014, 5:24 PM

## 2014-03-23 NOTE — Discharge Summary (Signed)
Obstetric Discharge Summary Reason for Admission: Induction of labor 2/2 gestational hypertension Prenatal Procedures: none Intrapartum Procedures: spontaneous vaginal delivery Postpartum Procedures: none Complications-Operative and Postpartum: 2nd degree perineal laceration HEMOGLOBIN  Date Value Ref Range Status  03/21/2014 11.7* 12.0 - 15.0 g/dL Final   HCT  Date Value Ref Range Status  03/21/2014 33.2* 36.0 - 46.0 % Final   Delivery Note Progressed to complete and began pushing. Progressed quickly to crowning.  At 5:37 PM a viable and healthy female was delivered via Vaginal, Spontaneous Delivery (Presentation: Right Occiput Anterior).   Initial attempt at delivery of anterior shoulder was unsuccessful. McRoberts employed But still unsuccessful delivery of shoulder. Suprapubic pressure was applied and we were able to grasp the anterior axilla and deliver the anterior shoulder.   Time was less than a minute  APGAR: 9, 9; weight .  Placenta status: Intact, Spontaneous. Cord: with the following complications: None.   Anesthesia: Epidural  Episiotomy: None Lacerations: 2nd degree Right Periclitoral/periurethral Suture Repair: 3.0 vicryl rapide Est. Blood Loss (mL): 300   HPI: Admitted for induction of labor after presenting to MAU and noted to have elevated blood pressures, subsequently admitted for IOL 2/2 gestational HTN.  Doing well today, no complaints.  Is breast feeding and plans on using condoms for contraception.  Physical Exam:  General: alert and cooperative Lochia: appropriate Uterine Fundus: firm DVT Evaluation: No evidence of DVT seen on physical exam.  Discharge Diagnoses: Term Pregnancy-delivered  Discharge Information: Date: 03/23/2014 Activity: pelvic rest Diet: routine Medications: None Condition: stable Instructions: refer to practice specific booklet Discharge to: home Follow-up Information    Follow up with [facility] In 5 weeks.   Specialty:  Home Health Services   Contact information:   Care Coordination for [facility] [place] [id number] [phone]       Newborn Data: Live born female  Birth Weight: 7 lb 12.5 oz (3530 g) APGAR: 9, 9  Home with mother.  Jody Palmer Jody Palmer 03/23/2014, 7:17 AM

## 2014-03-23 NOTE — Lactation Note (Signed)
This note was copied from the chart of Jody Palmer. Lactation Consultation Note  Mother's left nipple tender, no cracks.  Provided comfort gels. Encouraged parents to undress baby to diaper for feedings and wake her up if she falls asleep after 10 min. Reviewed massaging breasts, monitoring voids/stools and engorgement care. Encouraged her to call if she needs further assistance.  Patient Name: Jody Palmer [id number] Date: 03/23/2014 Reason for consult: Follow-up assessment   Maternal Data    Feeding Feeding Type: Breast Fed Length of feed: 10 min  LATCH Score/Interventions                      Lactation Tools Discussed/Used     Consult Status Consult Status: Complete    Carlye Grippe 03/23/2014, 10:26 AM

## 2014-06-03 ENCOUNTER — Encounter (HOSPITAL_COMMUNITY): Payer: Self-pay | Admitting: Emergency Medicine

## 2014-06-03 ENCOUNTER — Emergency Department (HOSPITAL_COMMUNITY)
Admission: EM | Admit: 2014-06-03 | Discharge: 2014-06-03 | Disposition: A | Discharge status: Home / Self Care | Payer: Medicaid Other | Attending: Emergency Medicine | Admitting: Emergency Medicine

## 2014-06-03 DIAGNOSIS — Z87738 Personal history of other specified (corrected) congenital malformations of digestive system: Secondary | ICD-10-CM | POA: Insufficient documentation

## 2014-06-03 DIAGNOSIS — Z8719 Personal history of other diseases of the digestive system: Secondary | ICD-10-CM | POA: Insufficient documentation

## 2014-06-03 DIAGNOSIS — Z3202 Encounter for pregnancy test, result negative: Secondary | ICD-10-CM | POA: Insufficient documentation

## 2014-06-03 DIAGNOSIS — N760 Acute vaginitis: Secondary | ICD-10-CM | POA: Insufficient documentation

## 2014-06-03 DIAGNOSIS — Z87891 Personal history of nicotine dependence: Secondary | ICD-10-CM | POA: Insufficient documentation

## 2014-06-03 DIAGNOSIS — R011 Cardiac murmur, unspecified: Secondary | ICD-10-CM | POA: Insufficient documentation

## 2014-06-03 DIAGNOSIS — J45909 Unspecified asthma, uncomplicated: Secondary | ICD-10-CM | POA: Insufficient documentation

## 2014-06-03 DIAGNOSIS — Z202 Contact with and (suspected) exposure to infections with a predominantly sexual mode of transmission: Secondary | ICD-10-CM | POA: Diagnosis not present

## 2014-06-03 DIAGNOSIS — B9689 Other specified bacterial agents as the cause of diseases classified elsewhere: Secondary | ICD-10-CM

## 2014-06-03 LAB — WET PREP, GENITAL
Trich, Wet Prep: NONE SEEN
YEAST WET PREP: NONE SEEN

## 2014-06-03 LAB — URINALYSIS, ROUTINE W REFLEX MICROSCOPIC
BILIRUBIN URINE: NEGATIVE
Glucose, UA: NEGATIVE mg/dL
HGB URINE DIPSTICK: NEGATIVE
KETONES UR: 15 mg/dL — AB
Nitrite: NEGATIVE
PH: 5.5 (ref 5.0–8.0)
Protein, ur: NEGATIVE mg/dL
SPECIFIC GRAVITY, URINE: 1.02 (ref 1.005–1.030)
Urobilinogen, UA: 1 mg/dL (ref 0.0–1.0)

## 2014-06-03 LAB — RPR: RPR Ser Ql: NONREACTIVE

## 2014-06-03 LAB — URINE MICROSCOPIC-ADD ON

## 2014-06-03 LAB — GC/CHLAMYDIA PROBE AMP (~~LOC~~) NOT AT ARMC
Chlamydia: POSITIVE — AB
Neisseria Gonorrhea: NEGATIVE

## 2014-06-03 LAB — POC URINE PREG, ED: Preg Test, Ur: NEGATIVE

## 2014-06-03 LAB — HIV ANTIBODY (ROUTINE TESTING W REFLEX): HIV Screen 4th Generation wRfx: NONREACTIVE

## 2014-06-03 MED ORDER — CEFTRIAXONE SODIUM 250 MG IJ SOLR
250.0000 mg | Freq: Once | INTRAMUSCULAR | Status: AC
  Administered 2014-06-03: 250 mg via INTRAMUSCULAR
  Filled 2014-06-03: qty 250

## 2014-06-03 MED ORDER — METRONIDAZOLE 500 MG PO TABS: 500.0000 mg | ORAL_TABLET | Freq: Two times a day (BID) | ORAL | Status: DC

## 2014-06-03 MED ORDER — AZITHROMYCIN 1 G PO PACK
1.0000 g | PACK | Freq: Once | ORAL | Status: AC
  Administered 2014-06-03: 1 g via ORAL
  Filled 2014-06-03: qty 1

## 2014-06-03 MED ORDER — LIDOCAINE HCL (PF) 1 % IJ SOLN
2.0000 mL | Freq: Once | INTRAMUSCULAR | Status: AC
  Administered 2014-06-03: 2 mL

## 2014-06-03 NOTE — ED Notes (Addendum)
C/o vaginal irritation, foul smelling odor, and white vaginal discharge x 3 days.  States her sexual partner told her he had chlamydia.

## 2014-06-03 NOTE — Discharge Instructions (Signed)
Sexually Transmitted Disease A sexually transmitted disease (STD) is a disease or infection that may be passed (transmitted) from person to person, usually during sexual activity. This may happen by way of saliva, semen, blood, vaginal mucus, or urine. Common STDs include:   Gonorrhea.   Chlamydia.   Syphilis.   HIV and AIDS.   Genital herpes.   Hepatitis B and C.   Trichomonas.   Human papillomavirus (HPV).   Pubic lice.   Scabies.  Mites.  Bacterial vaginosis. WHAT ARE CAUSES OF STDs? An STD may be caused by bacteria, a virus, or parasites. STDs are often transmitted during sexual activity if one person is infected. However, they may also be transmitted through nonsexual means. STDs may be transmitted after:   Sexual intercourse with an infected person.   Sharing sex toys with an infected person.   Sharing needles with an infected person or using unclean piercing or tattoo needles.  Having intimate contact with the genitals, mouth, or rectal areas of an infected person.   Exposure to infected fluids during birth. WHAT ARE THE SIGNS AND SYMPTOMS OF STDs? Different STDs have different symptoms. Some people may not have any symptoms. If symptoms are present, they may include:   Painful or bloody urination.   Pain in the pelvis, abdomen, vagina, anus, throat, or eyes.   A skin rash, itching, or irritation.  Growths, ulcerations, blisters, or sores in the genital and anal areas.  Abnormal vaginal discharge with or without bad odor.   Penile discharge in men.   Fever.   Pain or bleeding during sexual intercourse.   Swollen glands in the groin area.   Yellow skin and eyes (jaundice). This is seen with hepatitis.   Swollen testicles.  Infertility.  Sores and blisters in the mouth. HOW ARE STDs DIAGNOSED? To make a diagnosis, your health care provider may:   Take a medical history.   Perform a physical exam.   Take a sample of  any discharge to examine.  Swab the throat, cervix, opening to the penis, rectum, or vagina for testing.  Test a sample of your first morning urine.   Perform blood tests.   Perform a Pap test, if this applies.   Perform a colposcopy.   Perform a laparoscopy.  HOW ARE STDs TREATED? Treatment depends on the STD. Some STDs may be treated but not cured.   Chlamydia, gonorrhea, trichomonas, and syphilis can be cured with antibiotic medicine.   Genital herpes, hepatitis, and HIV can be treated, but not cured, with prescribed medicines. The medicines lessen symptoms.   Genital warts from HPV can be treated with medicine or by freezing, burning (electrocautery), or surgery. Warts may come back.   HPV cannot be cured with medicine or surgery. However, abnormal areas may be removed from the cervix, vagina, or vulva.   If your diagnosis is confirmed, your recent sexual partners need treatment. This is true even if they are symptom-free or have a negative culture or evaluation. They should not have sex until their health care providers say it is okay. HOW CAN I REDUCE MY RISK OF GETTING AN STD? Take these steps to reduce your risk of getting an STD:  Use latex condoms, dental dams, and water-soluble lubricants during sexual activity. Do not use petroleum jelly or oils.  Avoid having multiple sex partners.  Do not have sex with someone who has other sex partners.  Do not have sex with anyone you do not know or who is at   high risk for an STD.  Avoid risky sex practices that can break your skin.  Do not have sex if you have open sores on your mouth or skin.  Avoid drinking too much alcohol or taking illegal drugs. Alcohol and drugs can affect your judgment and put you in a vulnerable position.  Avoid engaging in oral and anal sex acts.  Get vaccinated for HPV and hepatitis. If you have not received these vaccines in the past, talk to your health care provider about whether one  or both might be right for you.   If you are at risk of being infected with HIV, it is recommended that you take a prescription medicine daily to prevent HIV infection. This is called pre-exposure prophylaxis (PrEP). You are considered at risk if:  You are a man who has sex with other men (MSM).  You are a heterosexual man or woman and are sexually active with more than one partner.  You take drugs by injection.  You are sexually active with a partner who has HIV.  Talk with your health care provider about whether you are at high risk of being infected with HIV. If you choose to begin PrEP, you should first be tested for HIV. You should then be tested every 3 months for as long as you are taking PrEP.  WHAT SHOULD I DO IF I THINK I HAVE AN STD?  See your health care provider.   Tell your sexual partner(s). They should be tested and treated for any STDs.  Do not have sex until your health care provider says it is okay. WHEN SHOULD I GET IMMEDIATE MEDICAL CARE? Contact your health care provider right away if:   You have severe abdominal pain.  You are a man and notice swelling or pain in your testicles.  You are a woman and notice swelling or pain in your vagina. Document Released: 05/01/2002 Document Revised: 02/13/2013 Document Reviewed: 08/29/2012 [facility] Patient Information 2015 [place], [place]. This information is not intended to replace advice given to you by your health care provider. Make sure you discuss any questions you have with your health care provider.  Chlamydia Chlamydia is an infection. It is spread through sexual contact. Chlamydia can be in different areas of the body. These areas include the cervix, urethra, throat, or rectum. You may not know you have chlamydia because many people never develop the symptoms. Chlamydia is not difficult to treat once you know you have it. However, if it is left untreated, chlamydia can lead to more serious health problems.    CAUSES  Chlamydia is caused by bacteria. It is a sexually transmitted disease. It is passed from an infected partner during intimate contact. This contact could be with the genitals, mouth, or rectal area. Chlamydia can also be passed from mothers to babies during birth. SIGNS AND SYMPTOMS  There may not be any symptoms. This is often the case early in the infection. If symptoms develop, they may include:  Mild pain and discomfort when urinating.  Redness, soreness, and swelling (inflammation) of the rectum.  Vaginal discharge.  Painful intercourse.  Abdominal pain.  Bleeding between menstrual periods. DIAGNOSIS  To diagnose this infection, your health care provider will do a pelvic exam. Cultures will be taken of the vagina, cervix, urine, and possibly the rectum to verify the diagnosis.  TREATMENT You will be given antibiotic medicines. If you are pregnant, certain types of antibiotics will need to be avoided. Any sexual partners should also be  treated, even if they do not show symptoms.  HOME CARE INSTRUCTIONS   Take your antibiotic medicine as directed by your health care provider. Finish the antibiotic even if you start to feel better.  Take medicines only as directed by your health care provider.  Inform any sexual partners about the infection. They should also be treated.  Do not have sexual contact until your health care provider tells you it is okay.  Get plenty of rest.  Eat a well-balanced diet.  Drink enough fluids to keep your urine clear or pale yellow.  Keep all follow-up visits as directed by your health care provider. SEEK MEDICAL CARE IF:  You have painful urination.  You have abdominal pain.  You have vaginal discharge.  You have painful sexual intercourse.  You have bleeding between periods and after sex.  You have a fever. SEEK IMMEDIATE MEDICAL CARE IF:   You experience nausea or vomiting.  You experience excessive sweating  (diaphoresis).  You have difficulty swallowing. MAKE SURE YOU:   Understand these instructions.  Will watch your condition.  Will get help right away if you are not doing well or get worse. Document Released: 11/18/2004 Document Revised: 06/25/2013 Document Reviewed: 10/16/2012 [facility] Patient Information 2015 [place], [place]. This information is not intended to replace advice given to you by your health care provider. Make sure you discuss any questions you have with your health care provider.  Bacterial Vaginosis Bacterial vaginosis is a vaginal infection that occurs when the normal balance of bacteria in the vagina is disrupted. It results from an overgrowth of certain bacteria. This is the most common vaginal infection in women of childbearing age. Treatment is important to prevent complications, especially in pregnant women, as it can cause a premature delivery. CAUSES  Bacterial vaginosis is caused by an increase in harmful bacteria that are normally present in smaller amounts in the vagina. Several different kinds of bacteria can cause bacterial vaginosis. However, the reason that the condition develops is not fully understood. RISK FACTORS Certain activities or behaviors can put you at an increased risk of developing bacterial vaginosis, including:  Having a new sex partner or multiple sex partners.  Douching.  Using an intrauterine device (IUD) for contraception. Women do not get bacterial vaginosis from toilet seats, bedding, swimming pools, or contact with objects around them. SIGNS AND SYMPTOMS  Some women with bacterial vaginosis have no signs or symptoms. Common symptoms include:  Grey vaginal discharge.  A fishlike odor with discharge, especially after sexual intercourse.  Itching or burning of the vagina and vulva.  Burning or pain with urination. DIAGNOSIS  Your health care provider will take a medical history and examine the vagina for signs of bacterial  vaginosis. A sample of vaginal fluid may be taken. Your health care provider will look at this sample under a microscope to check for bacteria and abnormal cells. A vaginal pH test may also be done.  TREATMENT  Bacterial vaginosis may be treated with antibiotic medicines. These may be given in the form of a pill or a vaginal cream. A second round of antibiotics may be prescribed if the condition comes back after treatment.  HOME CARE INSTRUCTIONS   Only take over-the-counter or prescription medicines as directed by your health care provider.  If antibiotic medicine was prescribed, take it as directed. Make sure you finish it even if you start to feel better.  Do not have sex until treatment is completed.  Tell all sexual partners that you  have a vaginal infection. They should see their health care provider and be treated if they have problems, such as a mild rash or itching.  Practice safe sex by using condoms and only having one sex partner. SEEK MEDICAL CARE IF:   Your symptoms are not improving after 3 days of treatment.  You have increased discharge or pain.  You have a fever. MAKE SURE YOU:   Understand these instructions.  Will watch your condition.  Will get help right away if you are not doing well or get worse. FOR MORE INFORMATION  Centers for Disease Control and Prevention, Division of STD Prevention: [employer] American Sexual Health Association (ASHA): www.ashastd.org  Document Released: 02/08/2005 Document Revised: 11/29/2012 Document Reviewed: 09/20/2012 [facility] Patient Information 2015 [place], [place]. This information is not intended to replace advice given to you by your health care provider. Make sure you discuss any questions you have with your health care provider.  Safe Sex Safe sex is about reducing the risk of giving or getting a sexually transmitted disease (STD). STDs are spread through sexual contact involving the genitals, mouth, or rectum. Some  STDs can be cured and others cannot. Safe sex can also prevent unintended pregnancies.  WHAT ARE SOME SAFE SEX PRACTICES?  Limit your sexual activity to only one partner who is having sex with only you.  Talk to your partner about his or her past partners, past STDs, and drug use.  Use a condom every time you have sexual intercourse. This includes vaginal, oral, and anal sexual activity. Both females and males should wear condoms during oral sex. Only use latex or polyurethane condoms and water-based lubricants. Using petroleum-based lubricants or oils to lubricate a condom will weaken the condom and increase the chance that it will break. The condom should be in place from the beginning to the end of sexual activity. Wearing a condom reduces, but does not completely eliminate, your risk of getting or giving an STD. STDs can be spread by contact with infected body fluids and skin.  Get vaccinated for hepatitis B and HPV.  Avoid alcohol and recreational drugs, which can affect your judgment. You may forget to use a condom or participate in high-risk sex.  For females, avoid douching after sexual intercourse. Douching can spread an infection farther into the reproductive tract.  Check your body for signs of sores, blisters, rashes, or unusual discharge. See your health care provider if you notice any of these signs.  Avoid sexual contact if you have symptoms of an infection or are being treated for an STD. If you or your partner has herpes, avoid sexual contact when blisters are present. Use condoms at all other times.  If you are at risk of being infected with HIV, it is recommended that you take a prescription medicine daily to prevent HIV infection. This is called pre-exposure prophylaxis (PrEP). You are considered at risk if:  You are a man who has sex with other men (MSM).  You are a heterosexual man or woman who is sexually active with more than one partner.  You take drugs by  injection.  You are sexually active with a partner who has HIV.  Talk with your health care provider about whether you are at high risk of being infected with HIV. If you choose to begin PrEP, you should first be tested for HIV. You should then be tested every 3 months for as long as you are taking PrEP.  See your health care provider for regular  screenings, exams, and tests for other STDs. Before having sex with a new partner, each of you should be screened for STDs and should talk about the results with each other. WHAT ARE THE BENEFITS OF SAFE SEX?   There is less chance of getting or giving an STD.  You can prevent unwanted or unintended pregnancies.  By discussing safe sex concerns with your partner, you may increase feelings of intimacy, comfort, trust, and honesty between the two of you. Document Released: 03/18/2004 Document Revised: 06/25/2013 Document Reviewed: 08/02/2011 [facility] Patient Information 2015 [place], [place]. This information is not intended to replace advice given to you by your health care provider. Make sure you discuss any questions you have with your health care provider.  Metronidazole tablets or capsules What is this medicine? METRONIDAZOLE (me troe NI da zole) is an antiinfective. It is used to treat certain kinds of bacterial and protozoal infections. It will not work for colds, flu, or other viral infections. This medicine may be used for other purposes; ask your health care provider or pharmacist if you have questions. COMMON BRAND NAME(S): Flagyl What should I tell my health care provider before I take this medicine? They need to know if you have any of these conditions: -anemia or other blood disorders -disease of the nervous system -fungal or yeast infection -if you drink alcohol containing drinks -liver disease -seizures -an unusual or allergic reaction to metronidazole, or other medicines, foods, dyes, or preservatives -pregnant or trying to get  pregnant -breast-feeding How should I use this medicine? Take this medicine by mouth with a full glass of water. Follow the directions on the prescription label. Take your medicine at regular intervals. Do not take your medicine more often than directed. Take all of your medicine as directed even if you think you are better. Do not skip doses or stop your medicine early. Talk to your pediatrician regarding the use of this medicine in children. Special care may be needed. Overdosage: If you think you have taken too much of this medicine contact a poison control center or emergency room at once. NOTE: This medicine is only for you. Do not share this medicine with others. What if I miss a dose? If you miss a dose, take it as soon as you can. If it is almost time for your next dose, take only that dose. Do not take double or extra doses. What may interact with this medicine? Do not take this medicine with any of the following medications: -alcohol or any product that contains alcohol -amprenavir oral solution -cisapride -disulfiram -dofetilide -dronedarone -paclitaxel injection -pimozide -ritonavir oral solution -sertraline oral solution -sulfamethoxazole-trimethoprim injection -thioridazine -ziprasidone This medicine may also interact with the following medications: -birth control pills -cimetidine -lithium -other medicines that prolong the QT interval (cause an abnormal heart rhythm) -phenobarbital -phenytoin -warfarin This list may not describe all possible interactions. Give your health care provider a list of all the medicines, herbs, non-prescription drugs, or dietary supplements you use. Also tell them if you smoke, drink alcohol, or use illegal drugs. Some items may interact with your medicine. What should I watch for while using this medicine? Tell your doctor or health care professional if your symptoms do not improve or if they get worse. You may get drowsy or dizzy. Do not  drive, use machinery, or do anything that needs mental alertness until you know how this medicine affects you. Do not stand or sit up quickly, especially if you are an older patient. This  reduces the risk of dizzy or fainting spells. Avoid alcoholic drinks while you are taking this medicine and for three days afterward. Alcohol may make you feel dizzy, sick, or flushed. If you are being treated for a sexually transmitted disease, avoid sexual contact until you have finished your treatment. Your sexual partner may also need treatment. What side effects may I notice from receiving this medicine? Side effects that you should report to your doctor or health care professional as soon as possible: -allergic reactions like skin rash or hives, swelling of the face, lips, or tongue -confusion, clumsiness -difficulty speaking -discolored or sore mouth -dizziness -fever, infection -numbness, tingling, pain or weakness in the hands or feet -trouble passing urine or change in the amount of urine -redness, blistering, peeling or loosening of the skin, including inside the mouth -seizures -unusually weak or tired -vaginal irritation, dryness, or discharge Side effects that usually do not require medical attention (report to your doctor or health care professional if they continue or are bothersome): -diarrhea -headache -irritability -metallic taste -nausea -stomach pain or cramps -trouble sleeping This list may not describe all possible side effects. Call your doctor for medical advice about side effects. You may report side effects to FDA at 1-800-FDA-1088. Where should I keep my medicine? Keep out of the reach of children. Store at room temperature below 25 degrees C (77 degrees F). Protect from light. Keep container tightly closed. Throw away any unused medicine after the expiration date. NOTE: This sheet is a summary. It may not cover all possible information. If you have questions about this  medicine, talk to your doctor, pharmacist, or health care provider.  2015, [employer]. (2012-09-15 14:08:39)

## 2014-06-03 NOTE — ED Provider Notes (Signed)
CSN: [id number]     Arrival date & time 06/03/14  0006 History   This chart was scribed for Delora Fuel, MD by Hilda Lias, ED Scribe. This patient was seen in room D32C/D32C and the patient's care was started at 2:43 AM.    Chief Complaint  Patient presents with  . Exposure to STD      Patient is a 24 y.o. female presenting with STD exposure. The history is provided by the patient. No language interpreter was used.  Exposure to STD Associated symptoms include abdominal pain.    HPI Comments: Jody Palmer is a 24 y.o. female who presents to the Emergency Department complaining of symptoms of a STD. Pt states that her sexual partner was diagnosed with chlamydia recently. Pt states that she has had lower abdominal pain, a white vaginal discharge with a fowl odor. Pt states that she has had abdominal pain for four days and notes that vaginal discharge began three days ago. Pt states that her abdominal pain is 4/10 in severity. Pt denies dysuria, hematuria, or any other urinary symptoms.      Past Medical History  Diagnosis Date  . SBO (small bowel obstruction)     this was during her first year of life, no problems since  . Asthma   . 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 Gastroschisis    Past Surgical History  Procedure Laterality Date  . Abdominal surgery    . Tonsillectomy    . Gastroschisis closure  1991   Family History  Problem Relation Age of Onset  . Diabetes Father   . Hypertension Father    History  Substance Use Topics  . Smoking status: Former Smoker    Types: Cigars    Quit date: 05/06/2013  . Smokeless tobacco: Never Used  . Alcohol Use: No     Comment: occ   OB History    Gravida Para Term Preterm AB TAB SAB Ectopic Multiple Living   1 1 1       0 1     Review of Systems  Gastrointestinal: Positive for abdominal pain.  Genitourinary: Positive for vaginal discharge. Negative for dysuria, hematuria and difficulty urinating.  All other  systems reviewed and are negative.     Allergies  Review of patient's allergies indicates no known allergies.  Home Medications   Prior to Admission medications   Medication Sig Start Date End Date Taking? Authorizing Provider  Prenatal Vit-Fe Fumarate-FA (PRENATAL MULTIVITAMIN) TABS tablet Take 1 tablet by mouth daily at 12 noon.   Yes Historical Provider, MD   BP 133/95 mmHg  Pulse 82  Temp(Src) 98.4 F (36.9 C) (Oral)  Resp 16  Wt 236 lb (107.049 kg)  SpO2 100%  LMP 05/07/2014 Physical Exam  Constitutional: She is oriented to person, place, and time. She appears well-developed and well-nourished.  HENT:  Head: Normocephalic and atraumatic.  Eyes: EOM are normal. Pupils are equal, round, and reactive to light.  Neck: Normal range of motion. Neck supple. No JVD present.  Cardiovascular: Normal rate, regular rhythm and normal heart sounds.   No murmur heard. Pulmonary/Chest: Effort normal and breath sounds normal. She has no wheezes. She has no rales. She exhibits no tenderness.  Abdominal: Soft. Bowel sounds are normal. She exhibits no distension and no mass. There is tenderness. There is no rebound and no guarding.  Mild tenderness lower abdomen  Musculoskeletal: Normal range of motion. She exhibits no edema.  Lymphadenopathy:  She has no cervical adenopathy.  Neurological: She is alert and oriented to person, place, and time. No cranial nerve deficit. She exhibits normal muscle tone. Coordination normal.  Skin: Skin is warm and dry. No rash noted.  Psychiatric: She has a normal mood and affect. Her behavior is normal. Judgment and thought content normal.  Nursing note and vitals reviewed.   ED Course  Procedures (including critical care time)  DIAGNOSTIC STUDIES: Oxygen Saturation is 100% on room air, normal by my interpretation.    COORDINATION OF CARE: 2:48 AM Discussed treatment plan with pt at bedside and pt agreed to plan.   Labs Review Results for  orders placed or performed during the hospital encounter of 06/03/14  Wet prep, genital  Result Value Ref Range   Yeast Wet Prep HPF POC NONE SEEN NONE SEEN   Trich, Wet Prep NONE SEEN NONE SEEN   Clue Cells Wet Prep HPF POC FEW (A) NONE SEEN   WBC, Wet Prep HPF POC FEW (A) NONE SEEN  Urinalysis, Routine w reflex microscopic  Result Value Ref Range   Color, Urine YELLOW YELLOW   APPearance CLEAR CLEAR   Specific Gravity, Urine 1.020 1.005 - 1.030   pH 5.5 5.0 - 8.0   Glucose, UA NEGATIVE NEGATIVE mg/dL   Hgb urine dipstick NEGATIVE NEGATIVE   Bilirubin Urine NEGATIVE NEGATIVE   Ketones, ur 15 (A) NEGATIVE mg/dL   Protein, ur NEGATIVE NEGATIVE mg/dL   Urobilinogen, UA 1.0 0.0 - 1.0 mg/dL   Nitrite NEGATIVE NEGATIVE   Leukocytes, UA TRACE (A) NEGATIVE  Urine microscopic-add on  Result Value Ref Range   Squamous Epithelial / LPF RARE RARE   WBC, UA 0-2 <3 WBC/hpf  POC Urine Pregnancy, ED (do NOT order at [facility])  Result Value Ref Range   Preg Test, Ur NEGATIVE NEGATIVE   MDM   Final diagnoses:  Exposure to STD  BV (bacterial vaginosis)    Exposure to chlamydia. Patient is tested for all other STDs and treatment is given with ceftriaxone and azithromycin. Symptoms are suggestive of bacterial vaginosis. Wet prep only shows few clue cells. However, given her clinical complaints, she will be treated with a one-week course of metronidazole.  I personally performed the services described in this documentation, which was scribed in my presence. The recorded information has been reviewed and is accurate.     Delora Fuel, MD 17/00/17 [id number]

## 2014-06-03 NOTE — ED Notes (Signed)
Pt advised to maintain abstinence for two weeks to ensure full effects of STD medications. Advised about condoms use. Pt denies questions.

## 2014-06-03 NOTE — ED Notes (Signed)
Pt is aware she needs a urine sample 

## 2014-06-05 ENCOUNTER — Telehealth (HOSPITAL_BASED_OUTPATIENT_CLINIC_OR_DEPARTMENT_OTHER): Payer: Self-pay | Admitting: Emergency Medicine

## 2014-12-26 ENCOUNTER — Encounter (HOSPITAL_COMMUNITY): Payer: Self-pay | Admitting: Emergency Medicine

## 2014-12-26 ENCOUNTER — Emergency Department (HOSPITAL_COMMUNITY)
Admission: EM | Admit: 2014-12-26 | Discharge: 2014-12-26 | Disposition: A | Discharge status: Home / Self Care | Payer: PRIVATE HEALTH INSURANCE | Attending: Emergency Medicine | Admitting: Emergency Medicine

## 2014-12-26 DIAGNOSIS — N760 Acute vaginitis: Secondary | ICD-10-CM | POA: Insufficient documentation

## 2014-12-26 DIAGNOSIS — Z792 Long term (current) use of antibiotics: Secondary | ICD-10-CM | POA: Insufficient documentation

## 2014-12-26 DIAGNOSIS — R011 Cardiac murmur, unspecified: Secondary | ICD-10-CM | POA: Insufficient documentation

## 2014-12-26 DIAGNOSIS — Z8719 Personal history of other diseases of the digestive system: Secondary | ICD-10-CM | POA: Insufficient documentation

## 2014-12-26 DIAGNOSIS — R1030 Lower abdominal pain, unspecified: Secondary | ICD-10-CM | POA: Insufficient documentation

## 2014-12-26 DIAGNOSIS — J45909 Unspecified asthma, uncomplicated: Secondary | ICD-10-CM | POA: Insufficient documentation

## 2014-12-26 DIAGNOSIS — Z3202 Encounter for pregnancy test, result negative: Secondary | ICD-10-CM | POA: Insufficient documentation

## 2014-12-26 DIAGNOSIS — Z87891 Personal history of nicotine dependence: Secondary | ICD-10-CM | POA: Insufficient documentation

## 2014-12-26 DIAGNOSIS — R1011 Right upper quadrant pain: Secondary | ICD-10-CM

## 2014-12-26 DIAGNOSIS — B9689 Other specified bacterial agents as the cause of diseases classified elsewhere: Secondary | ICD-10-CM

## 2014-12-26 LAB — WET PREP, GENITAL
Trich, Wet Prep: NONE SEEN
Yeast Wet Prep HPF POC: NONE SEEN

## 2014-12-26 LAB — CBC WITH DIFFERENTIAL/PLATELET
BASOS ABS: 0 10*3/uL (ref 0.0–0.1)
BASOS PCT: 0 %
EOS PCT: 2 %
Eosinophils Absolute: 0.1 10*3/uL (ref 0.0–0.7)
HCT: 35.9 % — ABNORMAL LOW (ref 36.0–46.0)
Hemoglobin: 12 g/dL (ref 12.0–15.0)
Lymphocytes Relative: 42 %
Lymphs Abs: 2.3 10*3/uL (ref 0.7–4.0)
MCH: 28.4 pg (ref 26.0–34.0)
MCHC: 33.4 g/dL (ref 30.0–36.0)
MCV: 84.9 fL (ref 78.0–100.0)
MONO ABS: 0.4 10*3/uL (ref 0.1–1.0)
Monocytes Relative: 7 %
NEUTROS ABS: 2.7 10*3/uL (ref 1.7–7.7)
Neutrophils Relative %: 49 %
PLATELETS: 298 10*3/uL (ref 150–400)
RBC: 4.23 MIL/uL (ref 3.87–5.11)
RDW: 14.4 % (ref 11.5–15.5)
WBC: 5.5 10*3/uL (ref 4.0–10.5)

## 2014-12-26 LAB — URINALYSIS, ROUTINE W REFLEX MICROSCOPIC
BILIRUBIN URINE: NEGATIVE
Glucose, UA: NEGATIVE mg/dL
HGB URINE DIPSTICK: NEGATIVE
Ketones, ur: NEGATIVE mg/dL
Leukocytes, UA: NEGATIVE
Nitrite: NEGATIVE
PH: 6.5 (ref 5.0–8.0)
Protein, ur: NEGATIVE mg/dL
SPECIFIC GRAVITY, URINE: 1.023 (ref 1.005–1.030)
Urobilinogen, UA: 1 mg/dL (ref 0.0–1.0)

## 2014-12-26 LAB — COMPREHENSIVE METABOLIC PANEL
ALBUMIN: 3.2 g/dL — AB (ref 3.5–5.0)
ALT: 8 U/L — AB (ref 14–54)
AST: 16 U/L (ref 15–41)
Alkaline Phosphatase: 64 U/L (ref 38–126)
Anion gap: 11 (ref 5–15)
BILIRUBIN TOTAL: 0.1 mg/dL — AB (ref 0.3–1.2)
BUN: 12 mg/dL (ref 6–20)
CO2: 24 mmol/L (ref 22–32)
CREATININE: 0.78 mg/dL (ref 0.44–1.00)
Calcium: 9 mg/dL (ref 8.9–10.3)
Chloride: 106 mmol/L (ref 101–111)
Glucose, Bld: 87 mg/dL (ref 65–99)
POTASSIUM: 3.7 mmol/L (ref 3.5–5.1)
SODIUM: 141 mmol/L (ref 135–145)
TOTAL PROTEIN: 7 g/dL (ref 6.5–8.1)

## 2014-12-26 LAB — LIPASE, BLOOD: LIPASE: 24 U/L (ref 11–51)

## 2014-12-26 LAB — RPR: RPR Ser Ql: NONREACTIVE

## 2014-12-26 LAB — HIV ANTIBODY (ROUTINE TESTING W REFLEX): HIV SCREEN 4TH GENERATION: NONREACTIVE

## 2014-12-26 LAB — POC URINE PREG, ED: Preg Test, Ur: NEGATIVE

## 2014-12-26 MED ORDER — AZITHROMYCIN 250 MG PO TABS
1000.0000 mg | ORAL_TABLET | Freq: Once | ORAL | Status: AC
  Administered 2014-12-26: 1000 mg via ORAL
  Filled 2014-12-26: qty 4

## 2014-12-26 MED ORDER — METRONIDAZOLE 500 MG PO TABS: 500.0000 mg | ORAL_TABLET | Freq: Two times a day (BID) | ORAL | Status: AC

## 2014-12-26 MED ORDER — CEFTRIAXONE SODIUM 250 MG IJ SOLR
250.0000 mg | Freq: Once | INTRAMUSCULAR | Status: AC
  Administered 2014-12-26: 250 mg via INTRAMUSCULAR
  Filled 2014-12-26: qty 250

## 2014-12-26 MED ORDER — METRONIDAZOLE 500 MG PO TABS: 500.0000 mg | ORAL_TABLET | Freq: Two times a day (BID) | ORAL | Status: DC

## 2014-12-26 NOTE — ED Notes (Signed)
Pt reports her boyfriend was tested for STDS and she thinks he lied about his results because she has had a smell to her vagina that she reports smells like fish as well as painful urination. LMP 10/10. Pt also reports lower abdominal but denies nausea, or diarrhea. Pt here for STD check- per patient.

## 2014-12-26 NOTE — ED Provider Notes (Signed)
CSN: [id number]     Arrival date & time 12/26/14  0725 History   First MD Initiated Contact with Patient 12/26/14 [phone]     Chief Complaint  Patient presents with  . Abdominal Pain     (Consider location/radiation/quality/duration/timing/severity/associated sxs/prior Treatment) HPI Comments: Vaginal smell/fishy for one week Baby 69 mos old 4 days ago abd pain RUQ radiating to back Coming and going spontaneously, tightening/stabbing pain, severe when it happens, lasts for 30sec then gone No n/v/diarrhea/constipation/no discharge/no vb, no dysuria/hemaTURIA  Before was pregnant had similar pain  Dull aching lower abd pain x1week    Patient is a 24 y.o. female presenting with abdominal pain and vaginal discharge.  Abdominal Pain Associated symptoms: no chest pain, no cough, no fever, no shortness of breath, no sore throat and no vaginal discharge (denies discharge, )   Vaginal Discharge Associated symptoms: abdominal pain   Associated symptoms: no fever     Past Medical History  Diagnosis Date  . SBO (small bowel obstruction) (HCC)     this was during her first year of life, no problems since  . Asthma   . 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 Gastroschisis    Past Surgical History  Procedure Laterality Date  . Abdominal surgery    . Tonsillectomy    . Gastroschisis closure  1991   Family History  Problem Relation Age of Onset  . Diabetes Father   . Hypertension Father    Social History  Substance Use Topics  . Smoking status: Former Smoker    Types: Cigars    Quit date: 05/06/2013  . Smokeless tobacco: Never Used  . Alcohol Use: No     Comment: occ   OB History    Gravida Para Term Preterm AB TAB SAB Ectopic Multiple Living   1 1 1       0 1     Review of Systems  Constitutional: Negative for fever.  HENT: Negative for sore throat.   Eyes: Negative for visual disturbance.  Respiratory: Negative for cough and shortness of breath.    Cardiovascular: Negative for chest pain.  Gastrointestinal: Positive for abdominal pain.  Genitourinary: Negative for vaginal discharge (denies discharge, ) and difficulty urinating.  Musculoskeletal: Negative for back pain and neck pain.  Skin: Negative for rash.  Neurological: Negative for syncope and headaches.      Allergies  Review of patient's allergies indicates no known allergies.  Home Medications   Prior to Admission medications   Medication Sig Start Date End Date Taking? Authorizing Provider  metroNIDAZOLE (FLAGYL) 500 MG tablet Take 1 tablet (500 mg total) by mouth 2 (two) times daily. 11/14/28   Delora Fuel, MD  Prenatal Vit-Fe Fumarate-FA (PRENATAL MULTIVITAMIN) TABS tablet Take 1 tablet by mouth daily at 12 noon.    Historical Provider, MD   LMP 12/02/2014 Physical Exam  Constitutional: She is oriented to person, place, and time. She appears well-developed and well-nourished. No distress.  HENT:  Head: Normocephalic and atraumatic.  Eyes: Conjunctivae and EOM are normal.  Neck: Normal range of motion.  Cardiovascular: Normal rate, regular rhythm, normal heart sounds and intact distal pulses.  Exam reveals no gallop and no friction rub.   No murmur heard. Pulmonary/Chest: Effort normal and breath sounds normal. No respiratory distress. She has no wheezes. She has no rales.  Abdominal: Soft. She exhibits no distension. There is tenderness (mild) in the right upper quadrant. There is no guarding, no CVA  tenderness, no tenderness at McBurney's point and negative Murphy's sign.  Genitourinary: Cervix exhibits discharge. Cervix exhibits no motion tenderness and no friability. Right adnexum displays no tenderness. Left adnexum displays no tenderness.  Musculoskeletal: She exhibits no edema or tenderness.  Neurological: She is alert and oriented to person, place, and time.  Skin: Skin is warm and dry. No rash noted. She is not diaphoretic. No erythema.  Nursing note and  vitals reviewed.   ED Course  Procedures (including critical care time) Labs Review Labs Reviewed  WET PREP, GENITAL  URINALYSIS, ROUTINE W REFLEX MICROSCOPIC (NOT AT [facility])  RPR  HIV ANTIBODY (ROUTINE TESTING)  POC URINE PREG, ED  GC/CHLAMYDIA PROBE AMP ([place]) NOT AT [facility]    Imaging Review No results found. I have personally reviewed and evaluated these images and lab results as part of my medical decision-making.   EKG Interpretation None      MDM   Final diagnoses:  None    24 year old female in no severe medical history presents with concern of intermittent right upper quadrant abdominal pain for 4 days, and concern for possible STI.  Abdominal exam is benign, with negative Murphy's sign, no worsening of pain with eating, patient afebrile and have low suspicion for cholecystitis. CMP and lipase were WNL.  CBC without leukocytosis.  After discussion with patient, she was treated empirically with Rocephin and azithromycin and HIV/RPR were ordered.  No sign of pelvic inflammatory disease on exam. Upreg negative. Recommend close outpt follow up. Patient discharged in stable condition with understanding of reasons to return.   Gareth Morgan, MD 12/27/14 2139

## 2014-12-27 LAB — GC/CHLAMYDIA PROBE AMP (~~LOC~~) NOT AT ARMC
Chlamydia: NEGATIVE
Neisseria Gonorrhea: NEGATIVE

## 2015-01-01 ENCOUNTER — Telehealth (HOSPITAL_BASED_OUTPATIENT_CLINIC_OR_DEPARTMENT_OTHER): Payer: Self-pay | Admitting: Emergency Medicine

## 2015-06-07 ENCOUNTER — Encounter (HOSPITAL_COMMUNITY): Payer: Self-pay | Admitting: Nurse Practitioner

## 2015-06-07 ENCOUNTER — Emergency Department (HOSPITAL_COMMUNITY)
Admission: EM | Admit: 2015-06-07 | Discharge: 2015-06-07 | Disposition: A | Discharge status: Home / Self Care | Payer: PRIVATE HEALTH INSURANCE | Attending: Emergency Medicine | Admitting: Emergency Medicine

## 2015-06-07 DIAGNOSIS — R011 Cardiac murmur, unspecified: Secondary | ICD-10-CM | POA: Insufficient documentation

## 2015-06-07 DIAGNOSIS — Z8719 Personal history of other diseases of the digestive system: Secondary | ICD-10-CM | POA: Insufficient documentation

## 2015-06-07 DIAGNOSIS — B9689 Other specified bacterial agents as the cause of diseases classified elsewhere: Secondary | ICD-10-CM

## 2015-06-07 DIAGNOSIS — J45909 Unspecified asthma, uncomplicated: Secondary | ICD-10-CM | POA: Insufficient documentation

## 2015-06-07 DIAGNOSIS — N760 Acute vaginitis: Secondary | ICD-10-CM | POA: Insufficient documentation

## 2015-06-07 DIAGNOSIS — B379 Candidiasis, unspecified: Secondary | ICD-10-CM

## 2015-06-07 DIAGNOSIS — Z79899 Other long term (current) drug therapy: Secondary | ICD-10-CM | POA: Insufficient documentation

## 2015-06-07 DIAGNOSIS — N898 Other specified noninflammatory disorders of vagina: Secondary | ICD-10-CM

## 2015-06-07 DIAGNOSIS — Z87891 Personal history of nicotine dependence: Secondary | ICD-10-CM | POA: Insufficient documentation

## 2015-06-07 DIAGNOSIS — B373 Candidiasis of vulva and vagina: Secondary | ICD-10-CM | POA: Insufficient documentation

## 2015-06-07 DIAGNOSIS — Z3202 Encounter for pregnancy test, result negative: Secondary | ICD-10-CM | POA: Insufficient documentation

## 2015-06-07 LAB — WET PREP, GENITAL
Sperm: NONE SEEN
Trich, Wet Prep: NONE SEEN

## 2015-06-07 LAB — URINALYSIS, ROUTINE W REFLEX MICROSCOPIC
Bilirubin Urine: NEGATIVE
GLUCOSE, UA: NEGATIVE mg/dL
Hgb urine dipstick: NEGATIVE
Ketones, ur: NEGATIVE mg/dL
LEUKOCYTES UA: NEGATIVE
Nitrite: NEGATIVE
PH: 7.5 (ref 5.0–8.0)
Protein, ur: NEGATIVE mg/dL
Specific Gravity, Urine: 1.018 (ref 1.005–1.030)

## 2015-06-07 LAB — POC URINE PREG, ED: PREG TEST UR: NEGATIVE

## 2015-06-07 MED ORDER — METRONIDAZOLE 500 MG PO TABS: 500.0000 mg | ORAL_TABLET | Freq: Two times a day (BID) | ORAL | Status: DC

## 2015-06-07 MED ORDER — AZITHROMYCIN 250 MG PO TABS
1000.0000 mg | ORAL_TABLET | Freq: Once | ORAL | Status: AC
  Administered 2015-06-07: 1000 mg via ORAL
  Filled 2015-06-07: qty 4

## 2015-06-07 MED ORDER — CEFTRIAXONE SODIUM 250 MG IJ SOLR
250.0000 mg | Freq: Once | INTRAMUSCULAR | Status: AC
  Administered 2015-06-07: 250 mg via INTRAMUSCULAR
  Filled 2015-06-07: qty 250

## 2015-06-07 MED ORDER — FLUCONAZOLE 150 MG PO TABS: 150.0000 mg | ORAL_TABLET | Freq: Once | ORAL | Status: DC

## 2015-06-07 MED ORDER — LIDOCAINE HCL (PF) 1 % IJ SOLN
INTRAMUSCULAR | Status: AC
  Administered 2015-06-07: 1 mL
  Filled 2015-06-07: qty 5

## 2015-06-07 MED ORDER — FLUCONAZOLE 100 MG PO TABS
150.0000 mg | ORAL_TABLET | Freq: Once | ORAL | Status: AC
Start: 2015-06-07 — End: 2015-06-07
  Administered 2015-06-07: 150 mg via ORAL
  Filled 2015-06-07: qty 2

## 2015-06-07 NOTE — ED Notes (Signed)
She c/o 2 week history intermittent vaginal discharge,pain, and burning. She tried otc topical treatment for yeast infection with short term relief of symptoms but then symptoms returned. Denies fevers, n/v. She has one sexual partner but is unsure if he may have other sexual partners.

## 2015-06-07 NOTE — ED Provider Notes (Signed)
CSN: [id number]     Arrival date & time 06/07/15  1837 History   First MD Initiated Contact with Patient 06/07/15 1904     Chief Complaint  Patient presents with  . Vaginal Discharge     (Consider location/radiation/quality/duration/timing/severity/associated sxs/prior Treatment) HPI Comments: Patient presents to the emergency department with chief complaint of vaginal discharge. She has been having symptoms for the past 2 weeks. She reports some associated dysuria, and mild crampy pelvic pain. She has tried treating herself for yeast infection. She reports foul-smelling odor. Denies any fevers or chills. Denies nausea, vomiting, or diarrhea.  The history is provided by the patient. No language interpreter was used.    Past Medical History  Diagnosis Date  . SBO (small bowel obstruction) (HCC)     this was during her first year of life, no problems since  . Asthma   . 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 Gastroschisis    Past Surgical History  Procedure Laterality Date  . Abdominal surgery    . Tonsillectomy    . Gastroschisis closure  1991   Family History  Problem Relation Age of Onset  . Diabetes Father   . Hypertension Father    Social History  Substance Use Topics  . Smoking status: Former Smoker    Types: Cigars    Quit date: 05/06/2013  . Smokeless tobacco: Never Used  . Alcohol Use: No     Comment: occ   OB History    Gravida Para Term Preterm AB TAB SAB Ectopic Multiple Living   1 1 1       0 1     Review of Systems  Constitutional: Negative for fever and chills.  Respiratory: Negative for shortness of breath.   Cardiovascular: Negative for chest pain.  Gastrointestinal: Negative for nausea, vomiting, diarrhea and constipation.  Genitourinary: Positive for dysuria and vaginal discharge.  All other systems reviewed and are negative.     Allergies  Review of patient's allergies indicates no known allergies.  Home Medications   Prior to  Admission medications   Medication Sig Start Date End Date Taking? Authorizing Provider  albuterol (PROVENTIL HFA;VENTOLIN HFA) 108 (90 BASE) MCG/ACT inhaler Inhale 1 puff into the lungs every 6 (six) hours as needed for wheezing or shortness of breath.    Historical Provider, MD   BP 134/99 mmHg  Pulse 90  Temp(Src) 98.3 F (36.8 C) (Oral)  Resp 16  SpO2 98%  LMP 05/21/2015 Physical Exam  Constitutional: She is oriented to person, place, and time. She appears well-developed and well-nourished.  HENT:  Head: Normocephalic and atraumatic.  Eyes: Conjunctivae and EOM are normal. Pupils are equal, round, and reactive to light.  Neck: Normal range of motion. Neck supple.  Cardiovascular: Normal rate and regular rhythm.  Exam reveals no gallop and no friction rub.   No murmur heard. Pulmonary/Chest: Effort normal and breath sounds normal. No respiratory distress. She has no wheezes. She has no rales. She exhibits no tenderness.  Abdominal: Soft. Bowel sounds are normal. She exhibits no distension and no mass. There is no tenderness. There is no rebound and no guarding.  Pelvic exam chaperoned by female ER tech, no right or left adnexal tenderness, no uterine tenderness, no vaginal discharge, no bleeding, no CMT or friability, no foreign body, no injury to the external genitalia, no other significant findings   Genitourinary:  Pelvic exam chaperoned by female ER tech, no right or left adnexal  tenderness, no uterine tenderness, moderate white vaginal discharge, no bleeding, no CMT or friability, no foreign body, no injury to the external genitalia, no other significant findings   Musculoskeletal: Normal range of motion. She exhibits no edema or tenderness.  Neurological: She is alert and oriented to person, place, and time.  Skin: Skin is warm and dry.  Psychiatric: She has a normal mood and affect. Her behavior is normal. Judgment and thought content normal.  Nursing note and vitals  reviewed.   ED Course  Procedures (including critical care time)  Results for orders placed or performed during the hospital encounter of 06/07/15  Wet prep, genital  Result Value Ref Range   Yeast Wet Prep HPF POC PRESENT (A) NONE SEEN   Trich, Wet Prep NONE SEEN NONE SEEN   Clue Cells Wet Prep HPF POC PRESENT (A) NONE SEEN   WBC, Wet Prep HPF POC MANY (A) NONE SEEN   Sperm NONE SEEN   Urinalysis, Routine w reflex microscopic (not at [facility])  Result Value Ref Range   Color, Urine YELLOW YELLOW   APPearance CLEAR CLEAR   Specific Gravity, Urine 1.018 1.005 - 1.030   pH 7.5 5.0 - 8.0   Glucose, UA NEGATIVE NEGATIVE mg/dL   Hgb urine dipstick NEGATIVE NEGATIVE   Bilirubin Urine NEGATIVE NEGATIVE   Ketones, ur NEGATIVE NEGATIVE mg/dL   Protein, ur NEGATIVE NEGATIVE mg/dL   Nitrite NEGATIVE NEGATIVE   Leukocytes, UA NEGATIVE NEGATIVE  POC urine preg, ED (not at [facility])  Result Value Ref Range   Preg Test, Ur NEGATIVE NEGATIVE   No results found.   I have personally reviewed and evaluated these images and lab results as part of my medical decision-making.   MDM   Final diagnoses:  Vaginal discharge  Yeast infection  BV (bacterial vaginosis)   Urinalysis is negative for infection, urine pregnancy test negative, wet prep consistent with bacterial vaginosis and yeast infection. Will also treat for gonorrhea and chlamydia given patient's concern for STD. No abdominal pain on exam. Will discharge to home.     Montine Circle, PA-C 06/07/15 2125  Daleen Bo, MD 06/08/15 [phone]

## 2015-06-07 NOTE — ED Notes (Signed)
Pt verbalized understanding of d/c instructions and has no further questions. Pt stable and NAD.  

## 2015-06-07 NOTE — Discharge Instructions (Signed)
Bacterial Vaginosis Bacterial vaginosis is a vaginal infection that occurs when the normal balance of bacteria in the vagina is disrupted. It results from an overgrowth of certain bacteria. This is the most common vaginal infection in women of childbearing age. Treatment is important to prevent complications, especially in pregnant women, as it can cause a premature delivery. CAUSES  Bacterial vaginosis is caused by an increase in harmful bacteria that are normally present in smaller amounts in the vagina. Several different kinds of bacteria can cause bacterial vaginosis. However, the reason that the condition develops is not fully understood. RISK FACTORS Certain activities or behaviors can put you at an increased risk of developing bacterial vaginosis, including:  Having a new sex partner or multiple sex partners.  Douching.  Using an intrauterine device (IUD) for contraception. Women do not get bacterial vaginosis from toilet seats, bedding, swimming pools, or contact with objects around them. SIGNS AND SYMPTOMS  Some women with bacterial vaginosis have no signs or symptoms. Common symptoms include:  Grey vaginal discharge.  A fishlike odor with discharge, especially after sexual intercourse.  Itching or burning of the vagina and vulva.  Burning or pain with urination. DIAGNOSIS  Your health care provider will take a medical history and examine the vagina for signs of bacterial vaginosis. A sample of vaginal fluid may be taken. Your health care provider will look at this sample under a microscope to check for bacteria and abnormal cells. A vaginal pH test may also be done.  TREATMENT  Bacterial vaginosis may be treated with antibiotic medicines. These may be given in the form of a pill or a vaginal cream. A second round of antibiotics may be prescribed if the condition comes back after treatment. Because bacterial vaginosis increases your risk for sexually transmitted diseases, getting  treated can help reduce your risk for chlamydia, gonorrhea, HIV, and herpes. HOME CARE INSTRUCTIONS   Only take over-the-counter or prescription medicines as directed by your health care provider.  If antibiotic medicine was prescribed, take it as directed. Make sure you finish it even if you start to feel better.  Tell all sexual partners that you have a vaginal infection. They should see their health care provider and be treated if they have problems, such as a mild rash or itching.  During treatment, it is important that you follow these instructions:  Avoid sexual activity or use condoms correctly.  Do not douche.  Avoid alcohol as directed by your health care provider.  Avoid breastfeeding as directed by your health care provider. SEEK MEDICAL CARE IF:   Your symptoms are not improving after 3 days of treatment.  You have increased discharge or pain.  You have a fever. MAKE SURE YOU:   Understand these instructions.  Will watch your condition.  Will get help right away if you are not doing well or get worse. FOR MORE INFORMATION  Centers for Disease Control and Prevention, Division of STD Prevention: www.cdc.gov/std American Sexual Health Association (ASHA): www.ashastd.org    This information is not intended to replace advice given to you by your health care provider. Make sure you discuss any questions you have with your health care provider.   Document Released: 02/08/2005 Document Revised: 03/01/2014 Document Reviewed: 09/20/2012 Elsevier Interactive Patient Education 2016 Elsevier Inc. Monilial Vaginitis Vaginitis in a soreness, swelling and redness (inflammation) of the vagina and vulva. Monilial vaginitis is not a sexually transmitted infection. CAUSES  Yeast vaginitis is caused by yeast (candida) that is normally found in   your vagina. With a yeast infection, the candida has overgrown in number to a point that upsets the chemical balance. SYMPTOMS   White,  thick vaginal discharge.  Swelling, itching, redness and irritation of the vagina and possibly the lips of the vagina (vulva).  Burning or painful urination.  Painful intercourse. DIAGNOSIS  Things that may contribute to monilial vaginitis are:  Postmenopausal and virginal states.  Pregnancy.  Infections.  Being tired, sick or stressed, especially if you had monilial vaginitis in the past.  Diabetes. Good control will help lower the chance.  Birth control pills.  Tight fitting garments.  Using bubble bath, feminine sprays, douches or deodorant tampons.  Taking certain medications that kill germs (antibiotics).  Sporadic recurrence can occur if you become ill. TREATMENT  Your caregiver will give you medication.  There are several kinds of anti monilial vaginal creams and suppositories specific for monilial vaginitis. For recurrent yeast infections, use a suppository or cream in the vagina 2 times a week, or as directed.  Anti-monilial or steroid cream for the itching or irritation of the vulva may also be used. Get your caregiver's permission.  Painting the vagina with methylene blue solution may help if the monilial cream does not work.  Eating yogurt may help prevent monilial vaginitis. HOME CARE INSTRUCTIONS   Finish all medication as prescribed.  Do not have sex until treatment is completed or after your caregiver tells you it is okay.  Take warm sitz baths.  Do not douche.  Do not use tampons, especially scented ones.  Wear cotton underwear.  Avoid tight pants and panty hose.  Tell your sexual partner that you have a yeast infection. They should go to their caregiver if they have symptoms such as mild rash or itching.  Your sexual partner should be treated as well if your infection is difficult to eliminate.  Practice safer sex. Use condoms.  Some vaginal medications cause latex condoms to fail. Vaginal medications that harm condoms are:  Cleocin  cream.  Butoconazole (Femstat).  Terconazole (Terazol) vaginal suppository.  Miconazole (Monistat) (may be purchased over the counter). SEEK MEDICAL CARE IF:   You have a temperature by mouth above 102 F (38.9 C).  The infection is getting worse after 2 days of treatment.  The infection is not getting better after 3 days of treatment.  You develop blisters in or around your vagina.  You develop vaginal bleeding, and it is not your menstrual period.  You have pain when you urinate.  You develop intestinal problems.  You have pain with sexual intercourse.   This information is not intended to replace advice given to you by your health care provider. Make sure you discuss any questions you have with your health care provider.   Document Released: 11/18/2004 Document Revised: 05/03/2011 Document Reviewed: 08/12/2014 Elsevier Interactive Patient Education 2016 Elsevier Inc.  

## 2015-06-09 LAB — GC/CHLAMYDIA PROBE AMP (~~LOC~~) NOT AT ARMC
CHLAMYDIA, DNA PROBE: NEGATIVE
NEISSERIA GONORRHEA: NEGATIVE

## 2015-08-31 ENCOUNTER — Encounter (HOSPITAL_COMMUNITY): Payer: Self-pay | Admitting: *Deleted

## 2015-08-31 ENCOUNTER — Emergency Department (HOSPITAL_COMMUNITY)
Admission: EM | Admit: 2015-08-31 | Discharge: 2015-09-01 | Disposition: A | Discharge status: Home / Self Care | Payer: PRIVATE HEALTH INSURANCE | Attending: Emergency Medicine | Admitting: Emergency Medicine

## 2015-08-31 DIAGNOSIS — N898 Other specified noninflammatory disorders of vagina: Secondary | ICD-10-CM

## 2015-08-31 DIAGNOSIS — F17210 Nicotine dependence, cigarettes, uncomplicated: Secondary | ICD-10-CM | POA: Insufficient documentation

## 2015-08-31 DIAGNOSIS — Z79899 Other long term (current) drug therapy: Secondary | ICD-10-CM | POA: Insufficient documentation

## 2015-08-31 DIAGNOSIS — N760 Acute vaginitis: Secondary | ICD-10-CM | POA: Insufficient documentation

## 2015-08-31 DIAGNOSIS — B9689 Other specified bacterial agents as the cause of diseases classified elsewhere: Secondary | ICD-10-CM

## 2015-08-31 DIAGNOSIS — J45909 Unspecified asthma, uncomplicated: Secondary | ICD-10-CM | POA: Insufficient documentation

## 2015-08-31 NOTE — ED Notes (Signed)
Patient presents stating her sexual partner was dx with STD  She states she is now having a discharge and odor

## 2015-09-01 LAB — WET PREP, GENITAL
SPERM: NONE SEEN
Trich, Wet Prep: NONE SEEN
Yeast Wet Prep HPF POC: NONE SEEN

## 2015-09-01 LAB — URINALYSIS, ROUTINE W REFLEX MICROSCOPIC
BILIRUBIN URINE: NEGATIVE
Glucose, UA: NEGATIVE mg/dL
Hgb urine dipstick: NEGATIVE
Ketones, ur: 40 mg/dL — AB
LEUKOCYTES UA: NEGATIVE
NITRITE: NEGATIVE
PH: 6 (ref 5.0–8.0)
Protein, ur: NEGATIVE mg/dL
SPECIFIC GRAVITY, URINE: 1.031 — AB (ref 1.005–1.030)

## 2015-09-01 LAB — GC/CHLAMYDIA PROBE AMP (~~LOC~~) NOT AT ARMC
Chlamydia: NEGATIVE
NEISSERIA GONORRHEA: NEGATIVE

## 2015-09-01 LAB — POC URINE PREG, ED: Preg Test, Ur: NEGATIVE

## 2015-09-01 LAB — RPR: RPR: NONREACTIVE

## 2015-09-01 LAB — HIV ANTIBODY (ROUTINE TESTING W REFLEX): HIV SCREEN 4TH GENERATION: NONREACTIVE

## 2015-09-01 MED ORDER — ONDANSETRON 4 MG PO TBDP
4.0000 mg | ORAL_TABLET | Freq: Once | ORAL | Status: AC
  Administered 2015-09-01: 4 mg via ORAL
  Filled 2015-09-01: qty 1

## 2015-09-01 MED ORDER — AZITHROMYCIN 250 MG PO TABS
500.0000 mg | ORAL_TABLET | Freq: Once | ORAL | Status: AC
  Administered 2015-09-01: 500 mg via ORAL
  Filled 2015-09-01: qty 2

## 2015-09-01 MED ORDER — CEFTRIAXONE SODIUM 250 MG IJ SOLR
250.0000 mg | Freq: Once | INTRAMUSCULAR | Status: AC
  Administered 2015-09-01: 250 mg via INTRAMUSCULAR
  Filled 2015-09-01: qty 250

## 2015-09-01 MED ORDER — STERILE WATER FOR INJECTION IJ SOLN
INTRAMUSCULAR | Status: AC
  Administered 2015-09-01: 10 mL
  Filled 2015-09-01: qty 10

## 2015-09-01 MED ORDER — METRONIDAZOLE 500 MG PO TABS: 500.0000 mg | ORAL_TABLET | Freq: Two times a day (BID) | ORAL | Status: DC

## 2015-09-01 NOTE — ED Provider Notes (Signed)
CSN: [id number]     Arrival date & time 08/31/15  2344 History   First MD Initiated Contact with Patient 09/01/15 0006     Chief Complaint  Patient presents with  . Vaginal Discharge     (Consider location/radiation/quality/duration/timing/severity/associated sxs/prior Treatment) HPI   Patient to the ER requesting testing and treatment for possible STD. Her partner of 8 years was recently diagnosed and treated  For gonorrhea. She developed discharge with odor 3 days ago with mild suprapubic cramping. She denies having had any dysuria, fever, N/V/D, abdominal pain, back pain, weakness or fatigue. She has not had any abnormal bleeding. They had not been using protection.  Past Medical History  Diagnosis Date  . SBO (small bowel obstruction) (HCC)     this was during her first year of life, no problems since  . Asthma   . 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 Gastroschisis    Past Surgical History  Procedure Laterality Date  . Abdominal surgery    . Tonsillectomy    . Gastroschisis closure  1991   Family History  Problem Relation Age of Onset  . Diabetes Father   . Hypertension Father    Social History  Substance Use Topics  . Smoking status: Current Every Day Smoker    Types: Cigars  . Smokeless tobacco: Never Used  . Alcohol Use: Yes     Comment: occ   OB History    Gravida Para Term Preterm AB TAB SAB Ectopic Multiple Living   1 1 1       0 1     Review of Systems  Review of Systems All other systems negative except as documented in the HPI. All pertinent positives and negatives as reviewed in the HPI.   Allergies  Review of patient's allergies indicates no known allergies.  Home Medications   Prior to Admission medications   Medication Sig Start Date End Date Taking? Authorizing Provider  acetaminophen (TYLENOL) 500 MG tablet Take 1,000 mg by mouth every 6 (six) hours as needed for moderate pain.   Yes Historical Provider, MD  albuterol (PROVENTIL  HFA;VENTOLIN HFA) 108 (90 BASE) MCG/ACT inhaler Inhale 1 puff into the lungs every 6 (six) hours as needed for wheezing or shortness of breath.   Yes Historical Provider, MD  bismuth subsalicylate (PEPTO BISMOL) 262 MG/15ML suspension Take 30 mLs by mouth every 6 (six) hours as needed for indigestion or diarrhea or loose stools.   Yes Historical Provider, MD  Phenylephrine-DM-GG-APAP (TYLENOL COLD MULTI-SYMPTOM) 5-10-200-325 MG TABS Take 2 capsules by mouth daily as needed (for cold).   Yes Historical Provider, MD  metroNIDAZOLE (FLAGYL) 500 MG tablet Take 1 tablet (500 mg total) by mouth 2 (two) times daily. 09/01/15   Jackline Castilla Carlota Raspberry, PA-C   BP 155/87 mmHg  Pulse 99  Temp(Src) 98.3 F (36.8 C) (Oral)  Resp 18  Ht 5\' 9"  (1.753 m)  Wt 107.956 kg  BMI 35.13 kg/m2  SpO2 95%  LMP 08/08/2015 Physical Exam  Constitutional: She appears well-developed and well-nourished. No distress.  HENT:  Head: Normocephalic and atraumatic.  Eyes: Pupils are equal, round, and reactive to light.  Neck: Normal range of motion. Neck supple.  Cardiovascular: Normal rate and regular rhythm.   Pulmonary/Chest: Effort normal.  Abdominal: Soft.  Genitourinary: Uterus normal. Cervix exhibits no motion tenderness, no discharge and no friability. Right adnexum displays no mass and no tenderness. Left adnexum displays no mass and no tenderness. No  tenderness or bleeding in the vagina. No foreign body around the vagina. No signs of injury around the vagina. Vaginal discharge (yellow) found.  malodorous  Neurological: She is alert.  Skin: Skin is warm and dry.  Nursing note and vitals reviewed.   ED Course  Procedures (including critical care time) Labs Review Labs Reviewed  WET PREP, GENITAL - Abnormal; Notable for the following:    Clue Cells Wet Prep HPF POC PRESENT (*)    WBC, Wet Prep HPF POC MODERATE (*)    All other components within normal limits  URINALYSIS, ROUTINE W REFLEX MICROSCOPIC (NOT AT [facility]) -  Abnormal; Notable for the following:    Specific Gravity, Urine 1.031 (*)    Ketones, ur 40 (*)    All other components within normal limits  RPR  HIV ANTIBODY (ROUTINE TESTING)  POC URINE PREG, ED  POC URINE PREG, ED  GC/CHLAMYDIA PROBE AMP ([place]) NOT AT [facility]    Imaging Review No results found. I have personally reviewed and evaluated these images and lab results as part of my medical decision-making.   EKG Interpretation None      MDM   Final diagnoses:  Vaginal discharge  Bacterial vaginosis    Neg urine preg, UA is unermarkable.  RPR, GC, HIV are pending. Medications  cefTRIAXone (ROCEPHIN) injection 250 mg (not administered)  azithromycin (ZITHROMAX) tablet 500 mg (not administered)  ondansetron (ZOFRAN-ODT) disintegrating tablet 4 mg (not administered)    Wet preps shows + clue cells with dc with flagyl [facility]. Pt not having pelvic pain or any symptoms consistent with intra abdominal/pelvic emergency.  Blood pressure 155/87, pulse 99, temperature 98.3 F (36.8 C), temperature source Oral, resp. rate 18, height 5\' 9"  (1.753 m), weight 107.956 kg, last menstrual period 08/08/2015, SpO2 95 %, unknown if currently breastfeeding.    Delos Haring, PA-C 09/01/15 [place], DO 09/01/15 [phone]

## 2015-09-01 NOTE — Discharge Instructions (Signed)
Vaginitis Vaginitis is an inflammation of the vagina. It is most often caused by a change in the normal balance of the bacteria and yeast that live in the vagina. This change in balance causes an overgrowth of certain bacteria or yeast, which causes the inflammation. There are different types of vaginitis, but the most common types are:  Bacterial vaginosis.  Yeast infection (candidiasis).  Trichomoniasis vaginitis. This is a sexually transmitted infection (STI).  Viral vaginitis.  Atrophic vaginitis.  Allergic vaginitis. CAUSES  The cause depends on the type of vaginitis. Vaginitis can be caused by:  Bacteria (bacterial vaginosis).  Yeast (yeast infection).  A parasite (trichomoniasis vaginitis)  A virus (viral vaginitis).  Low hormone levels (atrophic vaginitis). Low hormone levels can occur during pregnancy, breastfeeding, or after menopause.  Irritants, such as bubble baths, scented tampons, and feminine sprays (allergic vaginitis). Other factors can change the normal balance of the yeast and bacteria that live in the vagina. These include:  Antibiotic medicines.  Poor hygiene.  Diaphragms, vaginal sponges, spermicides, birth control pills, and intrauterine devices (IUD).  Sexual intercourse.  Infection.  Uncontrolled diabetes.  A weakened immune system. SYMPTOMS  Symptoms can vary depending on the cause of the vaginitis. Common symptoms include:  Abnormal vaginal discharge.  The discharge is white, gray, or yellow with bacterial vaginosis.  The discharge is thick, white, and cheesy with a yeast infection.  The discharge is frothy and yellow or greenish with trichomoniasis.  A bad vaginal odor.  The odor is fishy with bacterial vaginosis.  Vaginal itching, pain, or swelling.  Painful intercourse.  Pain or burning when urinating. Sometimes, there are no symptoms. TREATMENT  Treatment will vary depending on the type of infection.   Bacterial  vaginosis and trichomoniasis are often treated with antibiotic creams or pills.  Yeast infections are often treated with antifungal medicines, such as vaginal creams or suppositories.  Viral vaginitis has no cure, but symptoms can be treated with medicines that relieve discomfort. Your sexual partner should be treated as well.  Atrophic vaginitis may be treated with an estrogen cream, pill, suppository, or vaginal ring. If vaginal dryness occurs, lubricants and moisturizing creams may help. You may be told to avoid scented soaps, sprays, or douches.  Allergic vaginitis treatment involves quitting the use of the product that is causing the problem. Vaginal creams can be used to treat the symptoms. HOME CARE INSTRUCTIONS   Take all medicines as directed by your caregiver.  Keep your genital area clean and dry. Avoid soap and only rinse the area with water.  Avoid douching. It can remove the healthy bacteria in the vagina.  Do not use tampons or have sexual intercourse until your vaginitis has been treated. Use sanitary pads while you have vaginitis.  Wipe from front to back. This avoids the spread of bacteria from the rectum to the vagina.  Let air reach your genital area.  Wear cotton underwear to decrease moisture buildup.  Avoid wearing underwear while you sleep until your vaginitis is gone.  Avoid tight pants and underwear or nylons without a cotton panel.  Take off wet clothing (especially bathing suits) as soon as possible.  Use mild, non-scented products. Avoid using irritants, such as:  Scented feminine sprays.  Fabric softeners.  Scented detergents.  Scented tampons.  Scented soaps or bubble baths.  Practice safe sex and use condoms. Condoms may prevent the spread of trichomoniasis and viral vaginitis. SEEK MEDICAL CARE IF:   You have abdominal pain.  You  have a fever or persistent symptoms for more than 2-3 days.  You have a fever and your symptoms suddenly  get worse.   This information is not intended to replace advice given to you by your health care provider. Make sure you discuss any questions you have with your health care provider.   Document Released: 12/06/2006 Document Revised: 06/25/2014 Document Reviewed: 07/22/2011 Elsevier Interactive Patient Education 2016 Elsevier Inc.  Bacterial Vaginosis Bacterial vaginosis is a vaginal infection that occurs when the normal balance of bacteria in the vagina is disrupted. It results from an overgrowth of certain bacteria. This is the most common vaginal infection in women of childbearing age. Treatment is important to prevent complications, especially in pregnant women, as it can cause a premature delivery. CAUSES  Bacterial vaginosis is caused by an increase in harmful bacteria that are normally present in smaller amounts in the vagina. Several different kinds of bacteria can cause bacterial vaginosis. However, the reason that the condition develops is not fully understood. RISK FACTORS Certain activities or behaviors can put you at an increased risk of developing bacterial vaginosis, including:  Having a new sex partner or multiple sex partners.  Douching.  Using an intrauterine device (IUD) for contraception. Women do not get bacterial vaginosis from toilet seats, bedding, swimming pools, or contact with objects around them. SIGNS AND SYMPTOMS  Some women with bacterial vaginosis have no signs or symptoms. Common symptoms include:  Grey vaginal discharge.  A fishlike odor with discharge, especially after sexual intercourse.  Itching or burning of the vagina and vulva.  Burning or pain with urination. DIAGNOSIS  Your health care provider will take a medical history and examine the vagina for signs of bacterial vaginosis. A sample of vaginal fluid may be taken. Your health care provider will look at this sample under a microscope to check for bacteria and abnormal cells. A vaginal pH  test may also be done.  TREATMENT  Bacterial vaginosis may be treated with antibiotic medicines. These may be given in the form of a pill or a vaginal cream. A second round of antibiotics may be prescribed if the condition comes back after treatment. Because bacterial vaginosis increases your risk for sexually transmitted diseases, getting treated can help reduce your risk for chlamydia, gonorrhea, HIV, and herpes. HOME CARE INSTRUCTIONS   Only take over-the-counter or prescription medicines as directed by your health care provider.  If antibiotic medicine was prescribed, take it as directed. Make sure you finish it even if you start to feel better.  Tell all sexual partners that you have a vaginal infection. They should see their health care provider and be treated if they have problems, such as a mild rash or itching.  During treatment, it is important that you follow these instructions:  Avoid sexual activity or use condoms correctly.  Do not douche.  Avoid alcohol as directed by your health care provider.  Avoid breastfeeding as directed by your health care provider. SEEK MEDICAL CARE IF:   Your symptoms are not improving after 3 days of treatment.  You have increased discharge or pain.  You have a fever. MAKE SURE YOU:   Understand these instructions.  Will watch your condition.  Will get help right away if you are not doing well or get worse. FOR MORE INFORMATION  Centers for Disease Control and Prevention, Division of STD Prevention: [employer] American Sexual Health Association (ASHA): www.ashastd.org    This information is not intended to replace advice given to you  by your health care provider. Make sure you discuss any questions you have with your health care provider.   Document Released: 02/08/2005 Document Revised: 03/01/2014 Document Reviewed: 09/20/2012 Elsevier Interactive Patient Education [employer].

## 2015-09-01 NOTE — ED Notes (Signed)
Patient able to ambulate independently.  Pt did not want to wait the full wait time for IM.  Signs of allergic reaction gone over.  No signs at this time.

## 2015-09-04 ENCOUNTER — Telehealth: Payer: Self-pay | Admitting: *Deleted

## 2015-09-04 NOTE — Telephone Encounter (Signed)
Call from patient requesting STD results, ID confirmed, (-) test results given.

## 2015-11-13 ENCOUNTER — Encounter (HOSPITAL_COMMUNITY): Payer: Self-pay | Admitting: Emergency Medicine

## 2015-11-13 ENCOUNTER — Emergency Department (HOSPITAL_COMMUNITY)
Admission: EM | Admit: 2015-11-13 | Discharge: 2015-11-13 | Disposition: A | Discharge status: Home / Self Care | Payer: PRIVATE HEALTH INSURANCE | Attending: Emergency Medicine | Admitting: Emergency Medicine

## 2015-11-13 DIAGNOSIS — N72 Inflammatory disease of cervix uteri: Secondary | ICD-10-CM | POA: Insufficient documentation

## 2015-11-13 DIAGNOSIS — J029 Acute pharyngitis, unspecified: Secondary | ICD-10-CM | POA: Insufficient documentation

## 2015-11-13 DIAGNOSIS — J45909 Unspecified asthma, uncomplicated: Secondary | ICD-10-CM | POA: Insufficient documentation

## 2015-11-13 DIAGNOSIS — B349 Viral infection, unspecified: Secondary | ICD-10-CM

## 2015-11-13 DIAGNOSIS — F17210 Nicotine dependence, cigarettes, uncomplicated: Secondary | ICD-10-CM | POA: Insufficient documentation

## 2015-11-13 DIAGNOSIS — J069 Acute upper respiratory infection, unspecified: Secondary | ICD-10-CM | POA: Insufficient documentation

## 2015-11-13 LAB — URINALYSIS, ROUTINE W REFLEX MICROSCOPIC
Glucose, UA: NEGATIVE mg/dL
Hgb urine dipstick: NEGATIVE
Ketones, ur: 15 mg/dL — AB
Nitrite: NEGATIVE
Protein, ur: 30 mg/dL — AB
Specific Gravity, Urine: 1.038 — ABNORMAL HIGH (ref 1.005–1.030)
pH: 6 (ref 5.0–8.0)

## 2015-11-13 LAB — COMPREHENSIVE METABOLIC PANEL
ALBUMIN: 3.2 g/dL — AB (ref 3.5–5.0)
ALT: 8 U/L — ABNORMAL LOW (ref 14–54)
ANION GAP: 9 (ref 5–15)
AST: 12 U/L — ABNORMAL LOW (ref 15–41)
Alkaline Phosphatase: 71 U/L (ref 38–126)
BILIRUBIN TOTAL: 0.3 mg/dL (ref 0.3–1.2)
BUN: 13 mg/dL (ref 6–20)
CHLORIDE: 104 mmol/L (ref 101–111)
CO2: 24 mmol/L (ref 22–32)
Calcium: 9.2 mg/dL (ref 8.9–10.3)
Creatinine, Ser: 0.68 mg/dL (ref 0.44–1.00)
GFR calc Af Amer: >60 mL/min (ref >60)
GFR calc non Af Amer: >60 mL/min (ref >60)
GLUCOSE: 84 mg/dL (ref 65–99)
POTASSIUM: 3.7 mmol/L (ref 3.5–5.1)
Sodium: 137 mmol/L (ref 135–145)
TOTAL PROTEIN: 7.5 g/dL (ref 6.5–8.1)

## 2015-11-13 LAB — URINE MICROSCOPIC-ADD ON: RBC / HPF: NONE SEEN RBC/hpf (ref 0–5)

## 2015-11-13 LAB — CBC
HEMATOCRIT: 37.8 % (ref 36.0–46.0)
HEMOGLOBIN: 12.8 g/dL (ref 12.0–15.0)
MCH: 29 pg (ref 26.0–34.0)
MCHC: 33.9 g/dL (ref 30.0–36.0)
MCV: 85.5 fL (ref 78.0–100.0)
Platelets: 275 10*3/uL (ref 150–400)
RBC: 4.42 MIL/uL (ref 3.87–5.11)
RDW: 13.9 % (ref 11.5–15.5)
WBC: 10.9 10*3/uL — AB (ref 4.0–10.5)

## 2015-11-13 LAB — WET PREP, GENITAL
Sperm: NONE SEEN
Trich, Wet Prep: NONE SEEN
Yeast Wet Prep HPF POC: NONE SEEN

## 2015-11-13 LAB — RAPID STREP SCREEN (MED CTR MEBANE ONLY): Streptococcus, Group A Screen (Direct): NEGATIVE

## 2015-11-13 LAB — LIPASE, BLOOD: LIPASE: 17 U/L (ref 11–51)

## 2015-11-13 LAB — POC URINE PREG, ED: Preg Test, Ur: NEGATIVE

## 2015-11-13 MED ORDER — LIDOCAINE HCL (PF) 1 % IJ SOLN
INTRAMUSCULAR | Status: AC
  Administered 2015-11-13: 5 mL
  Filled 2015-11-13: qty 5

## 2015-11-13 MED ORDER — ACETAMINOPHEN 325 MG PO TABS
650.0000 mg | ORAL_TABLET | Freq: Once | ORAL | Status: AC
  Administered 2015-11-13: 650 mg via ORAL
  Filled 2015-11-13: qty 2

## 2015-11-13 MED ORDER — AZITHROMYCIN 250 MG PO TABS
1000.0000 mg | ORAL_TABLET | Freq: Once | ORAL | Status: AC
  Administered 2015-11-13: 1000 mg via ORAL
  Filled 2015-11-13: qty 4

## 2015-11-13 MED ORDER — CEFTRIAXONE SODIUM 250 MG IJ SOLR
250.0000 mg | Freq: Once | INTRAMUSCULAR | Status: AC
  Administered 2015-11-13: 250 mg via INTRAMUSCULAR
  Filled 2015-11-13: qty 250

## 2015-11-13 NOTE — ED Notes (Signed)
Pelvic done with spec obtained and sent

## 2015-11-13 NOTE — ED Provider Notes (Signed)
[facility] DEPT Provider Note   CSN: [id number] Arrival date & time: 11/13/15  0945     History   Chief Complaint Chief Complaint  Patient presents with  . Fever  . Sore Throat  . Vaginal Discharge  . Abdominal Pain    HPI Jody Palmer is a 25 y.o. female.  HPI Jody Palmer is a 25 y.o. female with hx of asthma presents to ED with complaint of sore throat, fever, chills, vaginal discharge. Patient reports a throat for 4 days, vaginal discharge for several weeks. Reports chronic lower abdominal discomfort, but no new abdominal pain. Reports mild nasal congestion and dry nonproductive cough. She has been using her inhaler more often than usual. Describes her vaginal discharge is white. Denies any urinary symptoms. Patient is sexually active with one female partner. Denies taking any medications for this. No sick contacts  Past Medical History:  Diagnosis Date  . Asthma   . Gastroschisis   . Heart murmur    at birth, no problems since  . SBO (small bowel obstruction) (HCC)    this was during her first year of life, no problems since    Patient Active Problem List   Diagnosis Date Noted  . Pregnancy 03/21/2014  . Spontaneous vaginal delivery 03/21/2014    Past Surgical History:  Procedure Laterality Date  . ABDOMINAL SURGERY    . GASTROSCHISIS CLOSURE  1991  . TONSILLECTOMY      OB History    Gravida Para Term Preterm AB Living   1 1 1     1    SAB TAB Ectopic Multiple Live Births         0 1       Home Medications    Prior to Admission medications   Medication Sig Start Date End Date Taking? Authorizing Provider  acetaminophen (TYLENOL) 500 MG tablet Take 1,000 mg by mouth every 6 (six) hours as needed for moderate pain.    Historical Provider, MD  albuterol (PROVENTIL HFA;VENTOLIN HFA) 108 (90 BASE) MCG/ACT inhaler Inhale 1 puff into the lungs every 6 (six) hours as needed for wheezing or shortness of breath.    Historical Provider, MD  bismuth  subsalicylate (PEPTO BISMOL) 262 MG/15ML suspension Take 30 mLs by mouth every 6 (six) hours as needed for indigestion or diarrhea or loose stools.    Historical Provider, MD  metroNIDAZOLE (FLAGYL) 500 MG tablet Take 1 tablet (500 mg total) by mouth 2 (two) times daily. 09/01/15   Tiffany Carlota Raspberry, PA-C  Phenylephrine-DM-GG-APAP (TYLENOL COLD MULTI-SYMPTOM) 5-10-200-325 MG TABS Take 2 capsules by mouth daily as needed (for cold).    Historical Provider, MD    Family History Family History  Problem Relation Age of Onset  . Diabetes Father   . Hypertension Father     Social History Social History  Substance Use Topics  . Smoking status: Current Every Day Smoker    Types: Cigars  . Smokeless tobacco: Never Used  . Alcohol use Yes     Comment: occ     Allergies   Review of patient's allergies indicates no known allergies.   Review of Systems Review of Systems  Constitutional: Positive for chills and fever.  HENT: Positive for congestion and sore throat. Negative for ear pain.   Respiratory: Positive for cough. Negative for chest tightness and shortness of breath.   Cardiovascular: Negative for chest pain, palpitations and leg swelling.  Gastrointestinal: Negative for abdominal pain, diarrhea, nausea and vomiting.  Genitourinary: Positive for vaginal  discharge. Negative for dysuria, flank pain, pelvic pain, vaginal bleeding and vaginal pain.  Musculoskeletal: Negative for arthralgias, myalgias, neck pain and neck stiffness.  Skin: Negative for rash.  Neurological: Positive for headaches. Negative for dizziness and weakness.  All other systems reviewed and are negative.    Physical Exam Updated Vital Signs BP 124/78   Pulse 91   Temp 100 F (37.8 C) (Oral)   Resp 18   Ht 5\' 8"  (1.727 m)   Wt 104.3 kg   LMP 11/04/2015 (Exact Date)   SpO2 100%   BMI 34.97 kg/m   Physical Exam  Constitutional: She appears well-developed and well-nourished. No distress.  HENT:  Head:  Normocephalic.  Normal nose. Oropharynx mildly erythematous, tonsils are normal, no exudate, uvula is midline. TMs are normal bilaterally.  Eyes: Conjunctivae are normal.  Neck: Neck supple.  Cardiovascular: Normal rate, regular rhythm and normal heart sounds.   Pulmonary/Chest: Effort normal and breath sounds normal. No respiratory distress. She has no wheezes. She has no rales.  Abdominal: Soft. Bowel sounds are normal. She exhibits no distension. There is no tenderness. There is no rebound and no guarding.  Genitourinary:  Genitourinary Comments: Normal external genitalia. Normal vaginal canal. moderate thin white discharge. Cervix is normal, closed. No CMT. No uterine or adnexal tenderness. No masses palpated.    Musculoskeletal: She exhibits no edema.  Lymphadenopathy:    She has no cervical adenopathy.  Neurological: She is alert.  Skin: Skin is warm and dry.  Psychiatric: She has a normal mood and affect. Her behavior is normal.  Nursing note and vitals reviewed.    ED Treatments / Results  Labs (all labs ordered are listed, but only abnormal results are displayed) Labs Reviewed  COMPREHENSIVE METABOLIC PANEL - Abnormal; Notable for the following:       Result Value   Albumin 3.2 (*)    AST 12 (*)    ALT 8 (*)    All other components within normal limits  CBC - Abnormal; Notable for the following:    WBC 10.9 (*)    All other components within normal limits  URINALYSIS, ROUTINE W REFLEX MICROSCOPIC (NOT AT [facility]) - Abnormal; Notable for the following:    Color, Urine AMBER (*)    APPearance CLOUDY (*)    Specific Gravity, Urine 1.038 (*)    Bilirubin Urine SMALL (*)    Ketones, ur 15 (*)    Protein, ur 30 (*)    Leukocytes, UA TRACE (*)    All other components within normal limits  URINE MICROSCOPIC-ADD ON - Abnormal; Notable for the following:    Squamous Epithelial / LPF 0-5 (*)    Bacteria, UA FEW (*)    All other components within normal limits  LIPASE, BLOOD    POC URINE PREG, ED    EKG  EKG Interpretation None       Radiology No results found.  Procedures Procedures (including critical care time)  Medications Ordered in ED Medications - No data to display   Initial Impression / Assessment and Plan / ED Course  I have reviewed the triage vital signs and the nursing notes.  Pertinent labs & imaging results that were available during my care of the patient were reviewed by me and considered in my medical decision making (see chart for details).  Patient in emergency department with flulike symptoms, as well as vaginal discharge. She denies any abdominal pain. Pelvic exam unremarkable other than moderate white discharge. Will check  strep. Will wait for wet prep. Blood work obtained and is unremarkable. Urinalysis negative. Pregnancy test is negative.  2:45 PM Wet prep showing many WBCs. Will treat with Rocephin and Zithromax in emergency department. We'll discharge home with symptomatic treatment for her URI. Her oropharynx is not impressive, there is no tonsillar swelling or exudate. The do not think patient has strep. Less likely viral pharyngitis. Treat with Tylenol, Motrin, salt water gargles, follow with primary care doctor.  Vitals:   11/13/15 1017 11/13/15 1018 11/13/15 1237  BP: 128/79  124/78  Pulse: 94  91  Resp: 16  18  Temp: 100 F (37.8 C)    TempSrc: Oral    SpO2: 100%  100%  Weight:  104.3 kg   Height:  5\' 8"  (1.727 m)      Final Clinical Impressions(s) / ED Diagnoses   Final diagnoses:  None    New Prescriptions New Prescriptions   No medications on file     Jeannett Senior, PA-C 11/13/15 1451    Isla Pence, MD 11/13/15 1502

## 2015-11-13 NOTE — ED Triage Notes (Signed)
Sore throat started Sunday-- with a fever, also having vomiting, abd pain, and small vaginal discharge.

## 2015-11-13 NOTE — ED Notes (Signed)
Fever body aches since Sunday and she states she has a white vag d/c  Has a slight odor and she states  She does  Trust her boyfriend , has had a sore throat using her inhaler a lot she states

## 2015-11-13 NOTE — Discharge Instructions (Signed)
Take ibuprofen and Tylenol for pain and fever. Salt water gargles for sore throat. Drink plenty of fluids. Rest. Your throat culture and your exam cultures are pending and if come back positive we will call you. Follow-up with your doctor otherwise.

## 2015-11-14 LAB — CULTURE, GROUP A STREP (THRC)

## 2015-11-14 LAB — GC/CHLAMYDIA PROBE AMP (~~LOC~~) NOT AT ARMC
CHLAMYDIA, DNA PROBE: NEGATIVE
NEISSERIA GONORRHEA: NEGATIVE

## 2016-04-01 ENCOUNTER — Inpatient Hospital Stay (HOSPITAL_COMMUNITY)
Admission: AD | Admit: 2016-04-01 | Discharge: 2016-04-02 | Disposition: A | Discharge status: Home / Self Care | Payer: Medicaid Other | Source: Ambulatory Visit | Attending: Obstetrics and Gynecology | Admitting: Obstetrics and Gynecology

## 2016-04-01 ENCOUNTER — Encounter (HOSPITAL_COMMUNITY): Payer: Self-pay | Admitting: *Deleted

## 2016-04-01 ENCOUNTER — Inpatient Hospital Stay (HOSPITAL_COMMUNITY): Payer: Medicaid Other

## 2016-04-01 DIAGNOSIS — F17290 Nicotine dependence, other tobacco product, uncomplicated: Secondary | ICD-10-CM | POA: Insufficient documentation

## 2016-04-01 DIAGNOSIS — R300 Dysuria: Secondary | ICD-10-CM | POA: Diagnosis not present

## 2016-04-01 DIAGNOSIS — R102 Pelvic and perineal pain: Secondary | ICD-10-CM | POA: Diagnosis present

## 2016-04-01 DIAGNOSIS — O26891 Other specified pregnancy related conditions, first trimester: Secondary | ICD-10-CM | POA: Diagnosis not present

## 2016-04-01 DIAGNOSIS — O2341 Unspecified infection of urinary tract in pregnancy, first trimester: Secondary | ICD-10-CM

## 2016-04-01 DIAGNOSIS — Z8249 Family history of ischemic heart disease and other diseases of the circulatory system: Secondary | ICD-10-CM | POA: Diagnosis not present

## 2016-04-01 DIAGNOSIS — O209 Hemorrhage in early pregnancy, unspecified: Secondary | ICD-10-CM

## 2016-04-01 DIAGNOSIS — O99331 Smoking (tobacco) complicating pregnancy, first trimester: Secondary | ICD-10-CM | POA: Insufficient documentation

## 2016-04-01 DIAGNOSIS — Z3A01 Less than 8 weeks gestation of pregnancy: Secondary | ICD-10-CM

## 2016-04-01 DIAGNOSIS — Z833 Family history of diabetes mellitus: Secondary | ICD-10-CM | POA: Insufficient documentation

## 2016-04-01 LAB — URINALYSIS, ROUTINE W REFLEX MICROSCOPIC
BILIRUBIN URINE: NEGATIVE
Bacteria, UA: NONE SEEN
GLUCOSE, UA: NEGATIVE mg/dL
KETONES UR: NEGATIVE mg/dL
NITRITE: NEGATIVE
PH: 5 (ref 5.0–8.0)
Protein, ur: 100 mg/dL — AB
Specific Gravity, Urine: 1.024 (ref 1.005–1.030)

## 2016-04-01 LAB — CBC WITH DIFFERENTIAL/PLATELET
BASOS ABS: 0 10*3/uL (ref 0.0–0.1)
Basophils Relative: 0 %
Eosinophils Absolute: 0.1 10*3/uL (ref 0.0–0.7)
Eosinophils Relative: 2 %
HEMATOCRIT: 34 % — AB (ref 36.0–46.0)
HEMOGLOBIN: 12 g/dL (ref 12.0–15.0)
LYMPHS PCT: 33 %
Lymphs Abs: 2.3 10*3/uL (ref 0.7–4.0)
MCH: 29 pg (ref 26.0–34.0)
MCHC: 35.3 g/dL (ref 30.0–36.0)
MCV: 82.1 fL (ref 78.0–100.0)
MONO ABS: 0.4 10*3/uL (ref 0.1–1.0)
Monocytes Relative: 6 %
NEUTROS ABS: 4.1 10*3/uL (ref 1.7–7.7)
NEUTROS PCT: 59 %
Platelets: 277 10*3/uL (ref 150–400)
RBC: 4.14 MIL/uL (ref 3.87–5.11)
RDW: 14 % (ref 11.5–15.5)
WBC: 7 10*3/uL (ref 4.0–10.5)

## 2016-04-01 LAB — HCG, QUANTITATIVE, PREGNANCY: hCG, Beta Chain, Quant, S: 15505 m[IU]/mL — ABNORMAL HIGH (ref <5)

## 2016-04-01 LAB — POCT PREGNANCY, URINE: PREG TEST UR: POSITIVE — AB

## 2016-04-01 NOTE — MAU Provider Note (Signed)
History     CSN: [id number]  Arrival date and time: 04/01/16 [id number]   First Provider Initiated Contact with Patient 04/01/16 2111      Chief Complaint  Patient presents with  . Pelvic Pain   Hematuria  This is a new problem. The current episode started yesterday. The problem is unchanged. She describes the hematuria as gross hematuria. She reports clotting at the end of her urine stream. Her pain is at a severity of 6/10. She describes her urine color as yellow. Irritative symptoms include urgency. Associated symptoms include dysuria. Pertinent negatives include no chills, fever, nausea or vomiting. She is sexually active.   Past Medical History:  Diagnosis Date  . Asthma   . Gastroschisis   . Heart murmur    at birth, no problems since  . SBO (small bowel obstruction)    this was during her first year of life, no problems since    Past Surgical History:  Procedure Laterality Date  . ABDOMINAL SURGERY    . GASTROSCHISIS CLOSURE  1991  . TONSILLECTOMY      Family History  Problem Relation Age of Onset  . Diabetes Father   . Hypertension Father     Social History  Substance Use Topics  . Smoking status: Current Every Day Smoker    Types: Cigars  . Smokeless tobacco: Never Used  . Alcohol use Yes     Comment: occ    Allergies: No Known Allergies  Prescriptions Prior to Admission  Medication Sig Dispense Refill Last Dose  . acetaminophen (TYLENOL) 500 MG tablet Take 1,000 mg by mouth every 6 (six) hours as needed for moderate pain.   Past Week at Unknown time  . albuterol (PROVENTIL HFA;VENTOLIN HFA) 108 (90 BASE) MCG/ACT inhaler Inhale 1 puff into the lungs every 6 (six) hours as needed for wheezing or shortness of breath.   unk  . bismuth subsalicylate (PEPTO BISMOL) 262 MG/15ML suspension Take 30 mLs by mouth every 6 (six) hours as needed for indigestion or diarrhea or loose stools.   unk  . metroNIDAZOLE (FLAGYL) 500 MG tablet Take 1 tablet (500 mg total) by mouth  2 (two) times daily. 14 tablet 0   . Phenylephrine-DM-GG-APAP (TYLENOL COLD MULTI-SYMPTOM) 5-10-200-325 MG TABS Take 2 capsules by mouth daily as needed (for cold).   unk    Review of Systems  Constitutional: Negative for chills and fever.  Gastrointestinal: Negative for nausea and vomiting.  Genitourinary: Positive for dysuria, pelvic pain and urgency. Negative for vaginal bleeding.   Physical Exam   Blood pressure 133/85, pulse 85, temperature 99.1 F (37.3 C), temperature source Oral, resp. rate 20, height 5\' 9"  (1.753 m), weight 239 lb 12 oz (108.7 kg), last menstrual period 02/22/2016, unknown if currently breastfeeding.  Physical Exam  Nursing note and vitals reviewed. Constitutional: She is oriented to person, place, and time. She appears well-developed and well-nourished. No distress.  HENT:  Head: Normocephalic.  Cardiovascular: Normal rate.   Respiratory: Effort normal.  GI: Soft. There is no tenderness. There is no rebound.  Neurological: She is alert and oriented to person, place, and time.  Skin: Skin is warm and dry.  Psychiatric: She has a normal mood and affect.   Results for orders placed or performed during the hospital encounter of 04/01/16 (from the past 24 hour(s))  Urinalysis, Routine w reflex microscopic     Status: Abnormal   Collection Time: 04/01/16  8:11 PM  Result Value Ref Range   Color,  Urine YELLOW YELLOW   APPearance CLOUDY (A) CLEAR   Specific Gravity, Urine 1.024 1.005 - 1.030   pH 5.0 5.0 - 8.0   Glucose, UA NEGATIVE NEGATIVE mg/dL   Hgb urine dipstick LARGE (A) NEGATIVE   Bilirubin Urine NEGATIVE NEGATIVE   Ketones, ur NEGATIVE NEGATIVE mg/dL   Protein, ur 100 (A) NEGATIVE mg/dL   Nitrite NEGATIVE NEGATIVE   Leukocytes, UA MODERATE (A) NEGATIVE   RBC / HPF TOO NUMEROUS TO COUNT 0 - 5 RBC/hpf   WBC, UA 6-30 0 - 5 WBC/hpf   Bacteria, UA NONE SEEN NONE SEEN   Squamous Epithelial / LPF 0-5 (A) NONE SEEN   Mucous PRESENT   Pregnancy, urine  POC     Status: Abnormal   Collection Time: 04/01/16  8:35 PM  Result Value Ref Range   Preg Test, Ur POSITIVE (A) NEGATIVE  CBC with Differential/Platelet     Status: Abnormal   Collection Time: 04/01/16  9:25 PM  Result Value Ref Range   WBC 7.0 4.0 - 10.5 K/uL   RBC 4.14 3.87 - 5.11 MIL/uL   Hemoglobin 12.0 12.0 - 15.0 g/dL   HCT 34.0 (L) 36.0 - 46.0 %   MCV 82.1 78.0 - 100.0 fL   MCH 29.0 26.0 - 34.0 pg   MCHC 35.3 30.0 - 36.0 g/dL   RDW 14.0 11.5 - 15.5 %   Platelets 277 150 - 400 K/uL   Neutrophils Relative % 59 %   Neutro Abs 4.1 1.7 - 7.7 K/uL   Lymphocytes Relative 33 %   Lymphs Abs 2.3 0.7 - 4.0 K/uL   Monocytes Relative 6 %   Monocytes Absolute 0.4 0.1 - 1.0 K/uL   Eosinophils Relative 2 %   Eosinophils Absolute 0.1 0.0 - 0.7 K/uL   Basophils Relative 0 %   Basophils Absolute 0.0 0.0 - 0.1 K/uL  hCG, quantitative, pregnancy     Status: Abnormal   Collection Time: 04/01/16  9:25 PM  Result Value Ref Range   hCG, Beta Chain, Quant, S 15,505 (H) <5 mIU/mL   US Ob Comp Less 14 Wks  Result Date: 04/01/2016 CLINICAL DATA:  26 y/o F; blood in urine. Estimated gestational age by LMP of 5 weeks and 4 days. EXAM: OBSTETRIC <14 WK [place] AND TRANSVAGINAL OB US TECHNIQUE: Both transabdominal and transvaginal ultrasound examinations were performed for complete evaluation of the gestation as well as the maternal uterus, adnexal regions, and pelvic cul-de-sac. Transvaginal technique was performed to assess early pregnancy. COMPARISON:  12/12/2013 obstetric ultrasound. FINDINGS: Intrauterine gestational sac: Single Yolk sac:  Visualized. Embryo:  Not Visualized. Cardiac Activity: Not Visualized. MSD: 15  mm   6 w   2  d Subchorionic hemorrhage: Small amount of subchorionic fluid is likely a subchorionic hemorrhage. Maternal uterus/adnexae: Calcified fibroid within the dorsal uterus measuring up to 2.6 cm. Bilateral adnexa are normal. IMPRESSION: 1. Probable early intrauterine gestational sac,  but no fetal pole or cardiac activity yet visualized. Recommend follow-up quantitative B-HCG levels and follow-up US in 14 days to assess viability. This recommendation follows SRU consensus guidelines: Diagnostic Criteria for Nonviable Pregnancy Early in the First Trimester. [place] Med 2013KT:048977. 2. Small subchorionic hemorrhage. Electronically Signed   By: Kristine Garbe M.D.   On: 04/01/2016 23:35   US Ob Transvaginal  Result Date: 04/01/2016 CLINICAL DATA:  26 y/o F; blood in urine. Estimated gestational age by LMP of 5 weeks and 4 days. EXAM: OBSTETRIC <14 WK [place]  AND TRANSVAGINAL OB US TECHNIQUE: Both transabdominal and transvaginal ultrasound examinations were performed for complete evaluation of the gestation as well as the maternal uterus, adnexal regions, and pelvic cul-de-sac. Transvaginal technique was performed to assess early pregnancy. COMPARISON:  12/12/2013 obstetric ultrasound. FINDINGS: Intrauterine gestational sac: Single Yolk sac:  Visualized. Embryo:  Not Visualized. Cardiac Activity: Not Visualized. MSD: 15  mm   6 w   2  d Subchorionic hemorrhage: Small amount of subchorionic fluid is likely a subchorionic hemorrhage. Maternal uterus/adnexae: Calcified fibroid within the dorsal uterus measuring up to 2.6 cm. Bilateral adnexa are normal. IMPRESSION: 1. Probable early intrauterine gestational sac, but no fetal pole or cardiac activity yet visualized. Recommend follow-up quantitative B-HCG levels and follow-up US in 14 days to assess viability. This recommendation follows SRU consensus guidelines: Diagnostic Criteria for Nonviable Pregnancy Early in the First Trimester. [place] Med 2013KT:048977. 2. Small subchorionic hemorrhage. Electronically Signed   By: Kristine Garbe M.D.   On: 04/01/2016 23:35    MAU Course  Procedures  MDM   Assessment and Plan   1. [redacted] weeks gestation of pregnancy   2. Vaginal bleeding in pregnancy, first trimester    DC  home UC/GC/CT pending  Comfort measures reviewed  1st Trimester precautions  Bleeding precautions RX: none Return to MAU as needed FU with OB as planned  Follow-up Information    [facility] Follow up.   Contact information: [place] [address] [phone]           Mathis Bud 04/01/2016, 9:13 PM

## 2016-04-01 NOTE — MAU Note (Signed)
PT  SAYS SHE SEES  BLOOD  IN URINE   AND  HURTS  AFTER  VOIDING  - STARTED  YESTERDAY.     NO MED  FOR  PAIN.

## 2016-04-01 NOTE — Discharge Instructions (Signed)

## 2016-04-02 LAB — WET PREP, GENITAL
SPERM: NONE SEEN
Trich, Wet Prep: NONE SEEN
YEAST WET PREP: NONE SEEN

## 2016-04-03 ENCOUNTER — Encounter (HOSPITAL_COMMUNITY): Payer: Self-pay

## 2016-04-03 ENCOUNTER — Inpatient Hospital Stay (HOSPITAL_COMMUNITY)
Admission: AD | Admit: 2016-04-03 | Discharge: 2016-04-03 | Disposition: A | Discharge status: Home / Self Care | Payer: Medicaid Other | Source: Ambulatory Visit | Attending: Obstetrics & Gynecology | Admitting: Obstetrics & Gynecology

## 2016-04-03 ENCOUNTER — Other Ambulatory Visit: Payer: Self-pay | Admitting: Student

## 2016-04-03 DIAGNOSIS — N390 Urinary tract infection, site not specified: Secondary | ICD-10-CM | POA: Insufficient documentation

## 2016-04-03 LAB — URINALYSIS, ROUTINE W REFLEX MICROSCOPIC
BILIRUBIN URINE: NEGATIVE
Bacteria, UA: NONE SEEN
GLUCOSE, UA: NEGATIVE mg/dL
KETONES UR: 20 mg/dL — AB
NITRITE: NEGATIVE
PROTEIN: 100 mg/dL — AB
Specific Gravity, Urine: 1.015 (ref 1.005–1.030)
pH: 7 (ref 5.0–8.0)

## 2016-04-03 MED ORDER — PHENAZOPYRIDINE HCL 200 MG PO TABS: 200.0000 mg | ORAL_TABLET | Freq: Three times a day (TID) | ORAL | 0 refills | Status: DC | PRN

## 2016-04-03 MED ORDER — CEPHALEXIN 500 MG PO CAPS: 500.0000 mg | ORAL_CAPSULE | Freq: Three times a day (TID) | ORAL | 0 refills | Status: DC

## 2016-04-03 MED ORDER — CEPHALEXIN 500 MG PO CAPS: 500.0000 mg | ORAL_CAPSULE | Freq: Three times a day (TID) | ORAL | 0 refills | Status: AC

## 2016-04-03 NOTE — MAU Note (Signed)
Patient presents with urinary frequency with blood in urine.

## 2016-04-03 NOTE — Progress Notes (Signed)
Patient Jody Palmer is a 26 year old 5 weeks and 6 days G2P1001 here with same complaints as she had on 04/01/2016. Urine culture pending but preliminary shows >100,000 of gram negative rods. Spoke with pharmacy; will start on Keflex and pyridium pending final results. Work note sent; patient verbalized understanding of returning to MAU if she has any increased of symptoms.

## 2016-04-04 LAB — CULTURE, OB URINE: Culture: >=100000 — AB

## 2016-04-05 LAB — GC/CHLAMYDIA PROBE AMP (~~LOC~~) NOT AT ARMC
Chlamydia: NEGATIVE
NEISSERIA GONORRHEA: NEGATIVE

## 2016-04-17 ENCOUNTER — Encounter (HOSPITAL_COMMUNITY): Payer: Self-pay

## 2016-04-17 ENCOUNTER — Inpatient Hospital Stay (HOSPITAL_COMMUNITY)
Admission: AD | Admit: 2016-04-17 | Discharge: 2016-04-18 | Disposition: A | Discharge status: Home / Self Care | Payer: Medicaid Other | Source: Ambulatory Visit | Attending: Obstetrics and Gynecology | Admitting: Obstetrics and Gynecology

## 2016-04-17 ENCOUNTER — Inpatient Hospital Stay (HOSPITAL_COMMUNITY): Payer: Medicaid Other

## 2016-04-17 DIAGNOSIS — O26891 Other specified pregnancy related conditions, first trimester: Secondary | ICD-10-CM | POA: Insufficient documentation

## 2016-04-17 DIAGNOSIS — O99331 Smoking (tobacco) complicating pregnancy, first trimester: Secondary | ICD-10-CM | POA: Insufficient documentation

## 2016-04-17 DIAGNOSIS — O3411 Maternal care for benign tumor of corpus uteri, first trimester: Secondary | ICD-10-CM | POA: Insufficient documentation

## 2016-04-17 DIAGNOSIS — Z3A01 Less than 8 weeks gestation of pregnancy: Secondary | ICD-10-CM | POA: Insufficient documentation

## 2016-04-17 DIAGNOSIS — R109 Unspecified abdominal pain: Secondary | ICD-10-CM | POA: Insufficient documentation

## 2016-04-17 DIAGNOSIS — F17290 Nicotine dependence, other tobacco product, uncomplicated: Secondary | ICD-10-CM | POA: Insufficient documentation

## 2016-04-17 DIAGNOSIS — R42 Dizziness and giddiness: Secondary | ICD-10-CM | POA: Diagnosis not present

## 2016-04-17 DIAGNOSIS — Z3491 Encounter for supervision of normal pregnancy, unspecified, first trimester: Secondary | ICD-10-CM

## 2016-04-17 DIAGNOSIS — D259 Leiomyoma of uterus, unspecified: Secondary | ICD-10-CM | POA: Diagnosis not present

## 2016-04-17 LAB — URINALYSIS, ROUTINE W REFLEX MICROSCOPIC
BILIRUBIN URINE: NEGATIVE
Glucose, UA: NEGATIVE mg/dL
Hgb urine dipstick: NEGATIVE
Ketones, ur: NEGATIVE mg/dL
LEUKOCYTES UA: NEGATIVE
NITRITE: NEGATIVE
Protein, ur: NEGATIVE mg/dL
Specific Gravity, Urine: 1.025 (ref 1.005–1.030)
pH: 5 (ref 5.0–8.0)

## 2016-04-17 LAB — COMPREHENSIVE METABOLIC PANEL
ALT: 8 U/L — AB (ref 14–54)
AST: 13 U/L — AB (ref 15–41)
Albumin: 3.2 g/dL — ABNORMAL LOW (ref 3.5–5.0)
Alkaline Phosphatase: 58 U/L (ref 38–126)
Anion gap: 7 (ref 5–15)
BUN: 12 mg/dL (ref 6–20)
CHLORIDE: 103 mmol/L (ref 101–111)
CO2: 25 mmol/L (ref 22–32)
CREATININE: 0.72 mg/dL (ref 0.44–1.00)
Calcium: 9 mg/dL (ref 8.9–10.3)
GFR calc Af Amer: >60 mL/min (ref >60)
GFR calc non Af Amer: >60 mL/min (ref >60)
Glucose, Bld: 90 mg/dL (ref 65–99)
Potassium: 4 mmol/L (ref 3.5–5.1)
Sodium: 135 mmol/L (ref 135–145)
Total Bilirubin: 0.2 mg/dL — ABNORMAL LOW (ref 0.3–1.2)
Total Protein: 6.5 g/dL (ref 6.5–8.1)

## 2016-04-17 LAB — CBC
HCT: 33.7 % — ABNORMAL LOW (ref 36.0–46.0)
Hemoglobin: 11.9 g/dL — ABNORMAL LOW (ref 12.0–15.0)
MCH: 29 pg (ref 26.0–34.0)
MCHC: 35.3 g/dL (ref 30.0–36.0)
MCV: 82.2 fL (ref 78.0–100.0)
PLATELETS: 282 10*3/uL (ref 150–400)
RBC: 4.1 MIL/uL (ref 3.87–5.11)
RDW: 14.2 % (ref 11.5–15.5)
WBC: 6.1 10*3/uL (ref 4.0–10.5)

## 2016-04-17 NOTE — MAU Provider Note (Signed)
Chief Complaint: Abdominal Cramping   None     SUBJECTIVE HPI: Jody Palmer is a 26 y.o. G2P1001 at [redacted]w[redacted]d by LMP who presents to maternity admissions reporting abdominal cramping and dizziness daily x 1 week. These are new symptoms.  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  She reports breathing smoke and plastic fumes at her new job and standing on her feet for 12 hours. She is eating and drinking enough, and reports she does drink water at work. Work makes her symptoms worse, both the pain and dizziness, resting make them better. She was seen in MAU 04/01/16 and diagnosed with IUP but was too early for fetal pole or FHR. She has not yet started care and no follow up ultrasound is ordered.  Her pain/dizziness are associated with episodes of nausea and feeling clammy.  The symptoms are all intermittent, reduce or resolve with rest and when not at work, and worse when at work. She denies vaginal bleeding, vaginal itching/burning, urinary symptoms, h/a, or fever/chills.     HPI  Past Medical History:  Diagnosis Date  . Asthma   . Gastroschisis   . Heart murmur    at birth, no problems since  . SBO (small bowel obstruction)    this was during her first year of life, no problems since   Past Surgical History:  Procedure Laterality Date  . ABDOMINAL SURGERY    . GASTROSCHISIS CLOSURE  1991  . TONSILLECTOMY     Social History   Social History  . Marital status: Single    Spouse name: N/A  . Number of children: N/A  . Years of education: N/A   Occupational History  . Not on file.   Social History Main Topics  . Smoking status: Current Every Day Smoker    Types: Cigars  . Smokeless tobacco: Never Used  . Alcohol use Yes     Comment: occ  . Drug use: Yes    Types: Marijuana     Comment: last use May 2015  . Sexual activity: Yes    Birth control/ protection: None   Other Topics Concern  . Not on file   Social History Narrative   . No narrative on file   No current facility-administered medications on file prior to encounter.    Current Outpatient Prescriptions on File Prior to Encounter  Medication Sig Dispense Refill  . acetaminophen (TYLENOL) 500 MG tablet Take 1,000 mg by mouth every 6 (six) hours as needed for moderate pain.    Marland Kitchen albuterol (PROVENTIL HFA;VENTOLIN HFA) 108 (90 BASE) MCG/ACT inhaler Inhale 1 puff into the lungs every 6 (six) hours as needed for wheezing or shortness of breath.    . metroNIDAZOLE (FLAGYL) 500 MG tablet Take 1 tablet (500 mg total) by mouth 2 (two) times daily. 14 tablet 0  . phenazopyridine (PYRIDIUM) 200 MG tablet Take 1 tablet (200 mg total) by mouth 3 (three) times daily as needed for pain. 10 tablet 0  . Phenylephrine-DM-GG-APAP (TYLENOL COLD MULTI-SYMPTOM) 5-10-200-325 MG TABS Take 2 capsules by mouth daily as needed (for cold).     No Known Allergies  ROS:  Review of Systems  Constitutional: Negative for chills, fatigue and fever.  Respiratory: Negative for shortness of breath.   Cardiovascular: Negative for chest pain.  Gastrointestinal: Positive for abdominal pain and nausea. Negative for vomiting.  Genitourinary: Positive for pelvic pain. Negative for difficulty urinating, dysuria, flank pain, vaginal bleeding, vaginal  discharge and vaginal pain.  Neurological: Positive for dizziness. Negative for headaches.  Psychiatric/Behavioral: Negative.      I have reviewed patient's Past Medical Hx, Surgical Hx, Family Hx, Social Hx, medications and allergies.   Physical Exam   Patient Vitals for the past 24 hrs:  BP Temp Temp src Pulse Resp Height Weight  04/18/16 0028 127/71 98 F (36.7 C) Oral 89 18 - -  04/17/16 2241 142/81 98.6 F (37 C) - 91 20 5\' 8"  (1.727 m) 242 lb (109.8 kg)   Constitutional: Well-developed, well-nourished female in no acute distress.  HEART: normal rate, heart sounds, regular rhythm RESP: normal effort, lung sounds clear and equal  bilaterally GI: Abd soft, non-tender. Pos BS x 4 MS: Extremities nontender, no edema, normal ROM Neurological - alert, oriented, normal speech, no focal findings or movement disorder noted, neck supple without rigidity, cranial nerves II through XII intact, DTR's normal and symmetric, motor and sensory grossly normal bilaterally, normal muscle tone, no tremors, strength 5/5 GU: Neg CVAT.  PELVIC EXAM: Deferred, done on 04/01/16   LAB RESULTS Results for orders placed or performed during the hospital encounter of 04/17/16 (from the past 24 hour(s))  Urinalysis, Routine w reflex microscopic     Status: None   Collection Time: 04/17/16 10:48 PM  Result Value Ref Range   Color, Urine YELLOW YELLOW   APPearance CLEAR CLEAR   Specific Gravity, Urine 1.025 1.005 - 1.030   pH 5.0 5.0 - 8.0   Glucose, UA NEGATIVE NEGATIVE mg/dL   Hgb urine dipstick NEGATIVE NEGATIVE   Bilirubin Urine NEGATIVE NEGATIVE   Ketones, ur NEGATIVE NEGATIVE mg/dL   Protein, ur NEGATIVE NEGATIVE mg/dL   Nitrite NEGATIVE NEGATIVE   Leukocytes, UA NEGATIVE NEGATIVE  CBC     Status: Abnormal   Collection Time: 04/17/16 11:07 PM  Result Value Ref Range   WBC 6.1 4.0 - 10.5 K/uL   RBC 4.10 3.87 - 5.11 MIL/uL   Hemoglobin 11.9 (L) 12.0 - 15.0 g/dL   HCT 33.7 (L) 36.0 - 46.0 %   MCV 82.2 78.0 - 100.0 fL   MCH 29.0 26.0 - 34.0 pg   MCHC 35.3 30.0 - 36.0 g/dL   RDW 14.2 11.5 - 15.5 %   Platelets 282 150 - 400 K/uL  Comprehensive metabolic panel     Status: Abnormal   Collection Time: 04/17/16 11:07 PM  Result Value Ref Range   Sodium 135 135 - 145 mmol/L   Potassium 4.0 3.5 - 5.1 mmol/L   Chloride 103 101 - 111 mmol/L   CO2 25 22 - 32 mmol/L   Glucose, Bld 90 65 - 99 mg/dL   BUN 12 6 - 20 mg/dL   Creatinine, Ser 0.72 0.44 - 1.00 mg/dL   Calcium 9.0 8.9 - 10.3 mg/dL   Total Protein 6.5 6.5 - 8.1 g/dL   Albumin 3.2 (L) 3.5 - 5.0 g/dL   AST 13 (L) 15 - 41 U/L   ALT 8 (L) 14 - 54 U/L   Alkaline Phosphatase 58 38 -  126 U/L   Total Bilirubin 0.2 (L) 0.3 - 1.2 mg/dL   GFR calc non Af Amer >60 >60 mL/min   GFR calc Af Amer >60 >60 mL/min   Anion gap 7 5 - 15       IMAGING   US Ob Transvaginal  Result Date: 04/18/2016 CLINICAL DATA:  Cramping and dizziness EXAM: TRANSVAGINAL OB ULTRASOUND TECHNIQUE: Transvaginal ultrasound was performed for complete evaluation of  the gestation as well as the maternal uterus, adnexal regions, and pelvic cul-de-sac. COMPARISON:  04/01/2016 FINDINGS: Intrauterine gestational sac: Single intrauterine gestation Yolk sac:  Visualized Embryo:  Visualized Cardiac Activity: Visualized Heart Rate: 159 bpm CRL:   19.7  mm   8 w 3 d                  [place] EDC: November 24, 2016 Subchorionic hemorrhage:  None visualized. Maternal uterus/adnexae: Ovaries are within normal limits. The left ovary measures 4 x 2.7 x 2.6 cm. The right ovary measures 2.8 x 2 x 2.4 cm. Rim calcified presumed fibroid in the left posterior wall measuring 3 x 2.3 x 2.2 cm. IMPRESSION: Single viable intrauterine gestation as above. Electronically Signed   By: Donavan Foil M.D.   On: 04/18/2016 00:17      MAU Management/MDM: Ordered labs and [place] and reviewed results.  No acute findings.  Neuro exam wnl.  [place] with IUP. Small posterior fibroid may be contributing to abdominal pain.  Note provided for pt to miss work x2 days, she may give notice and quit her job after that because it has been so difficult during pregnancy.  Pt to start prenatal care as soon as possible.  Interested in Microbiologist.  List of providers given, discussed practices that offer waterbirth, including [facility] offices.  Pt stable at time of discharge.  ASSESSMENT 1. Normal IUP (intrauterine pregnancy) on prenatal ultrasound, first trimester   2. Abdominal pain during pregnancy in first trimester   3. Uterine fibroids affecting pregnancy in first trimester   4. Dizziness     PLAN Discharge home Allergies as of 04/18/2016   No Known Allergies      Medication List    STOP taking these medications   metroNIDAZOLE 500 MG tablet Commonly known as:  FLAGYL   phenazopyridine 200 MG tablet Commonly known as:  PYRIDIUM   TYLENOL COLD MULTI-SYMPTOM 5-10-200-325 MG Tabs Generic drug:  Phenylephrine-DM-GG-APAP     TAKE these medications   acetaminophen 500 MG tablet Commonly known as:  TYLENOL Take 1,000 mg by mouth every 6 (six) hours as needed for moderate pain.   albuterol 108 (90 Base) MCG/ACT inhaler Commonly known as:  PROVENTIL HFA;VENTOLIN HFA Inhale 1 puff into the lungs every 6 (six) hours as needed for wheezing or shortness of breath.      Follow-up Information    [facility] Follow up.   Why:  Or prenatal provider of your choice, see list provided.  Return to MAU as needed for emergencies. Contact information: [place] [address] [phone]           Fatima Blank Certified Nurse-Midwife 04/18/2016  12:30 AM

## 2016-04-17 NOTE — MAU Note (Addendum)
Abd cramping since Friday. Very dizzy to point feel may faint. No bleeding or d/c. Just finished treatment for uti

## 2016-04-17 NOTE — Progress Notes (Addendum)
G2P1 @ 7.[redacted]wksga. Presents to triage for abdominal cramping and dizziness during work. Denies LOF or bleeding. VSS. See flow sheet for details.   2311: lab at bs.   2315: U/S paged. Returned page to bring pt.  1230: Discharge instructions given with pt understanding. Pt left unit vias ambulatory.

## 2016-04-18 DIAGNOSIS — O3411 Maternal care for benign tumor of corpus uteri, first trimester: Secondary | ICD-10-CM

## 2016-04-18 DIAGNOSIS — O26891 Other specified pregnancy related conditions, first trimester: Secondary | ICD-10-CM | POA: Diagnosis not present

## 2016-04-18 DIAGNOSIS — O9989 Other specified diseases and conditions complicating pregnancy, childbirth and the puerperium: Secondary | ICD-10-CM | POA: Diagnosis not present

## 2016-04-18 DIAGNOSIS — R109 Unspecified abdominal pain: Secondary | ICD-10-CM

## 2016-04-18 NOTE — Discharge Instructions (Signed)
[facility] Providers  ° ° °[facility]       Phone: [phone] ° °[facility] Phone: [phone] ° °[facility] [place]  Phone: [phone] ° °[facility] [place] [facility]  Phone: [phone] ° °[facility]  Phone: [phone] ° °[facility]       Phone: [phone] ° °[facility]    Phone: [phone]  ° °[facility]    Phone: [phone] ° °[facility]    Phone: [phone] ° °[facility]    Phone: [phone] ° °[facility]    Phone: [phone] ° °[facility]       Phone: [phone]  ° °[facility]  Phone: [phone] ° °[facility]    Phone: [phone] ° °[facility]   Phone: [phone] ° °[facility]      Phone: [phone] ° °[facility]    Phone: [phone] ° °

## 2016-06-10 ENCOUNTER — Encounter (HOSPITAL_COMMUNITY): Payer: Self-pay

## 2016-06-10 ENCOUNTER — Emergency Department (HOSPITAL_COMMUNITY)
Admission: EM | Admit: 2016-06-10 | Discharge: 2016-06-10 | Disposition: A | Discharge status: Home / Self Care | Payer: Medicaid Other | Attending: Emergency Medicine | Admitting: Emergency Medicine

## 2016-06-10 DIAGNOSIS — R102 Pelvic and perineal pain: Secondary | ICD-10-CM | POA: Diagnosis not present

## 2016-06-10 DIAGNOSIS — R1030 Lower abdominal pain, unspecified: Secondary | ICD-10-CM | POA: Diagnosis present

## 2016-06-10 DIAGNOSIS — Z79899 Other long term (current) drug therapy: Secondary | ICD-10-CM | POA: Insufficient documentation

## 2016-06-10 DIAGNOSIS — F17290 Nicotine dependence, other tobacco product, uncomplicated: Secondary | ICD-10-CM | POA: Diagnosis not present

## 2016-06-10 DIAGNOSIS — J45909 Unspecified asthma, uncomplicated: Secondary | ICD-10-CM | POA: Diagnosis not present

## 2016-06-10 LAB — COMPREHENSIVE METABOLIC PANEL
ALBUMIN: 3.2 g/dL — AB (ref 3.5–5.0)
ALT: 9 U/L — AB (ref 14–54)
AST: 17 U/L (ref 15–41)
Alkaline Phosphatase: 62 U/L (ref 38–126)
Anion gap: 8 (ref 5–15)
BILIRUBIN TOTAL: 0.2 mg/dL — AB (ref 0.3–1.2)
BUN: 6 mg/dL (ref 6–20)
CO2: 28 mmol/L (ref 22–32)
CREATININE: 0.8 mg/dL (ref 0.44–1.00)
Calcium: 8.5 mg/dL — ABNORMAL LOW (ref 8.9–10.3)
Chloride: 102 mmol/L (ref 101–111)
GFR calc Af Amer: >60 mL/min (ref >60)
GLUCOSE: 94 mg/dL (ref 65–99)
POTASSIUM: 3.2 mmol/L — AB (ref 3.5–5.1)
Sodium: 138 mmol/L (ref 135–145)
TOTAL PROTEIN: 6.9 g/dL (ref 6.5–8.1)

## 2016-06-10 LAB — CBC
HEMATOCRIT: 34.8 % — AB (ref 36.0–46.0)
Hemoglobin: 11.8 g/dL — ABNORMAL LOW (ref 12.0–15.0)
MCH: 28.7 pg (ref 26.0–34.0)
MCHC: 33.9 g/dL (ref 30.0–36.0)
MCV: 84.7 fL (ref 78.0–100.0)
PLATELETS: 287 10*3/uL (ref 150–400)
RBC: 4.11 MIL/uL (ref 3.87–5.11)
RDW: 14 % (ref 11.5–15.5)
WBC: 5.6 10*3/uL (ref 4.0–10.5)

## 2016-06-10 LAB — URINALYSIS, ROUTINE W REFLEX MICROSCOPIC
BILIRUBIN URINE: NEGATIVE
GLUCOSE, UA: NEGATIVE mg/dL
Hgb urine dipstick: NEGATIVE
KETONES UR: NEGATIVE mg/dL
LEUKOCYTES UA: NEGATIVE
NITRITE: NEGATIVE
PH: 7 (ref 5.0–8.0)
PROTEIN: NEGATIVE mg/dL
Specific Gravity, Urine: 1.021 (ref 1.005–1.030)

## 2016-06-10 LAB — WET PREP, GENITAL
Sperm: NONE SEEN
Trich, Wet Prep: NONE SEEN
Yeast Wet Prep HPF POC: NONE SEEN

## 2016-06-10 LAB — HCG, QUANTITATIVE, PREGNANCY: hCG, Beta Chain, Quant, S: 2 m[IU]/mL (ref <5)

## 2016-06-10 LAB — LIPASE, BLOOD: Lipase: 16 U/L (ref 11–51)

## 2016-06-10 MED ORDER — IBUPROFEN 800 MG PO TABS: 800.0000 mg | ORAL_TABLET | Freq: Three times a day (TID) | ORAL | 0 refills | Status: DC | PRN

## 2016-06-10 NOTE — ED Triage Notes (Signed)
Pt endorses abd pain, pt had abortion last month and began her first menstrual cycle back was 2 weeks and pt states "I am still seeing blood when I urinate and only then" Pt denies n/v/d. VSS.

## 2016-06-10 NOTE — ED Provider Notes (Signed)
[facility] DEPT Provider Note   CSN: [id number] Arrival date & time: 06/10/16  1905     History   Chief Complaint Chief Complaint  Patient presents with  . Abdominal Pain    HPI Jody Palmer is a 26 y.o. female.  HPI Patient presents to the emergency department with lower abdominal discomfort.  The patient states that she had an abortion at the end of February. She states that she had a normal period 2 weeks ago.  States he is occasionally still seeing some blood when she urinates and then had some blood noted after intercourse last week. The patient denies chest pain, shortness of breath, headache,blurred vision, neck pain, fever, cough, weakness, numbness, dizziness, anorexia, edema, abdominal pain, nausea, vomiting, diarrhea, rash, back pain, dysuria, hematemesis, bloody stool, near syncope, or syncope.  Past Medical History:  Diagnosis Date  . Asthma   . Gastroschisis   . Heart murmur    at birth, no problems since  . SBO (small bowel obstruction) (HCC)    this was during her first year of life, no problems since    Patient Active Problem List   Diagnosis Date Noted  . Pregnancy 03/21/2014  . Spontaneous vaginal delivery 03/21/2014    Past Surgical History:  Procedure Laterality Date  . ABDOMINAL SURGERY    . GASTROSCHISIS CLOSURE  1991  . TONSILLECTOMY      OB History    Gravida Para Term Preterm AB Living   2 1 1     1    SAB TAB Ectopic Multiple Live Births         0 1       Home Medications    Prior to Admission medications   Medication Sig Start Date End Date Taking? Authorizing Provider  acetaminophen (TYLENOL) 500 MG tablet Take 1,000 mg by mouth every 6 (six) hours as needed for moderate pain.    Historical Provider, MD  albuterol (PROVENTIL HFA;VENTOLIN HFA) 108 (90 BASE) MCG/ACT inhaler Inhale 1 puff into the lungs every 6 (six) hours as needed for wheezing or shortness of breath.    Historical Provider, MD    Family History Family  History  Problem Relation Age of Onset  . Diabetes Father   . Hypertension Father     Social History Social History  Substance Use Topics  . Smoking status: Current Every Day Smoker    Types: Cigars  . Smokeless tobacco: Never Used  . Alcohol use Yes     Comment: occ     Allergies   Patient has no known allergies.   Review of Systems Review of Systems All other systems negative except as documented in the HPI. All pertinent positives and negatives as reviewed in the HPI. Physical Exam Updated Vital Signs BP (!) 123/103   Pulse 86   Temp 98.6 F (37 C) (Oral)   Resp 16   Ht 5\' 9"  (1.753 m)   Wt 108 kg   LMP 05/20/2016 (Exact Date)   SpO2 100%   Breastfeeding? No   BMI 35.15 kg/m   Physical Exam  Constitutional: She is oriented to person, place, and time. She appears well-developed and well-nourished. No distress.  HENT:  Head: Normocephalic and atraumatic.  Mouth/Throat: Oropharynx is clear and moist.  Eyes: Pupils are equal, round, and reactive to light.  Neck: Normal range of motion. Neck supple.  Cardiovascular: Normal rate, regular rhythm and normal heart sounds.  Exam reveals no gallop and no friction rub.   No murmur  heard. Pulmonary/Chest: Effort normal and breath sounds normal. No respiratory distress. She has no wheezes.  Abdominal: Soft. Bowel sounds are normal. She exhibits no distension. There is no tenderness.    Genitourinary: Pelvic exam was performed with patient supine. There is no rash, tenderness or lesion on the right labia. There is no rash, tenderness or lesion on the left labia. Cervix exhibits no motion tenderness, no discharge and no friability. Right adnexum displays no mass and no tenderness. Left adnexum displays no mass and no tenderness. No erythema, tenderness or bleeding in the vagina. No foreign body in the vagina. No signs of injury around the vagina. No vaginal discharge found.  Neurological: She is alert and oriented to person,  place, and time. She exhibits normal muscle tone. Coordination normal.  Skin: Skin is warm and dry. Capillary refill takes less than 2 seconds. No rash noted. No erythema.  Psychiatric: She has a normal mood and affect. Her behavior is normal.  Nursing note and vitals reviewed.    ED Treatments / Results  Labs (all labs ordered are listed, but only abnormal results are displayed) Labs Reviewed  WET PREP, GENITAL - Abnormal; Notable for the following:       Result Value   Clue Cells Wet Prep HPF POC PRESENT (*)    WBC, Wet Prep HPF POC MANY (*)    All other components within normal limits  COMPREHENSIVE METABOLIC PANEL - Abnormal; Notable for the following:    Potassium 3.2 (*)    Calcium 8.5 (*)    Albumin 3.2 (*)    ALT 9 (*)    Total Bilirubin 0.2 (*)    All other components within normal limits  CBC - Abnormal; Notable for the following:    Hemoglobin 11.8 (*)    HCT 34.8 (*)    All other components within normal limits  LIPASE, BLOOD  URINALYSIS, ROUTINE W REFLEX MICROSCOPIC  HCG, QUANTITATIVE, PREGNANCY  RPR  GC/CHLAMYDIA PROBE AMP () NOT AT [facility]    EKG  EKG Interpretation None       Radiology No results found.  Procedures Procedures (including critical care time)  Medications Ordered in ED Medications - No data to display   Initial Impression / Assessment and Plan / ED Course  I have reviewed the triage vital signs and the nursing notes.  Pertinent labs & imaging results that were available during my care of the patient were reviewed by me and considered in my medical decision making (see chart for details).     Patient does not have any abnormalities noted on her vaginal exam.  Patient will be treated for the crampy discomfort.  She will be referred to GYN.  Patient is told to return here as needed.  Imaging was done at this time is feeling it would be of limited yield a stone.  Her exam and physical findings.  Final Clinical Impressions(s)  / ED Diagnoses   Final diagnoses:  None    New Prescriptions New Prescriptions   No medications on file     Dalia Heading, PA-C 06/10/16 [place], MD 06/11/16 0040

## 2016-06-10 NOTE — Discharge Instructions (Signed)
Return here as needed.  Follow-up with the clinic provided.  °

## 2016-06-11 LAB — GC/CHLAMYDIA PROBE AMP (~~LOC~~) NOT AT ARMC
Chlamydia: NEGATIVE
Neisseria Gonorrhea: NEGATIVE

## 2016-06-11 LAB — RPR: RPR Ser Ql: NONREACTIVE

## 2017-02-22 NOTE — L&D Delivery Note (Addendum)
[facility] Delivery Note  Jody Palmer is a 27 y.o. [id number] s/p SVD at [redacted]w[redacted]d. She was admitted for IOL for chronic HTN.   ROM: 1h 104m with clear fluid GBS Status: negative Maximum Maternal Temperature: 98.5  Labor Progress: . Labor augmented with misoprostol, oxytocin and a foley bulb   Delivery Date/Time: 1056 Delivery: Called to room and patient was complete and pushing. Head delivered LOA. No nuchal cord present. Shoulder and body delivered in usual fashion. Infant with spontaneous cry, placed on mother's abdomen, dried and stimulated. Cord clamped x 2 after 1-minute delay, and cut by FOB. Cord blood drawn. Placenta had not delivered spontaneously after 30 minutes. Patient given IN nitroglycerin x2. Manual extraction of the placenta performed by Dr. Elly Modena with successful delivery of an intact placenta. Fundus firm with massage and Pitocin. Labia, perineum, vagina, and cervix inspected and were not found to have any lacerations. Patient given 2g Ancef after delivery due to manual extraction of placenta.   Placenta: intact Complications: non-spontaneous delivery of the placenta with successful manual extraction Lacerations: none EBL: 210ml  Infant: vigorous female  APGARs 41 and 9  2982g  Orlene Plum, MD Family Medicine Resident   [facility]  I was gloved and present for the delivery in its entirety, and I agree with the above resident's note.    Phill Myron, D.O. OB Fellow  12/24/2017, 4:22 PM

## 2017-03-08 ENCOUNTER — Encounter (HOSPITAL_COMMUNITY): Payer: Self-pay | Admitting: Emergency Medicine

## 2017-03-08 ENCOUNTER — Emergency Department (HOSPITAL_COMMUNITY)
Admission: EM | Admit: 2017-03-08 | Discharge: 2017-03-08 | Disposition: A | Discharge status: Home / Self Care | Payer: PRIVATE HEALTH INSURANCE | Attending: Emergency Medicine | Admitting: Emergency Medicine

## 2017-03-08 DIAGNOSIS — Z202 Contact with and (suspected) exposure to infections with a predominantly sexual mode of transmission: Secondary | ICD-10-CM | POA: Insufficient documentation

## 2017-03-08 DIAGNOSIS — B9689 Other specified bacterial agents as the cause of diseases classified elsewhere: Secondary | ICD-10-CM

## 2017-03-08 DIAGNOSIS — F17290 Nicotine dependence, other tobacco product, uncomplicated: Secondary | ICD-10-CM | POA: Insufficient documentation

## 2017-03-08 DIAGNOSIS — A599 Trichomoniasis, unspecified: Secondary | ICD-10-CM

## 2017-03-08 DIAGNOSIS — Z711 Person with feared health complaint in whom no diagnosis is made: Secondary | ICD-10-CM

## 2017-03-08 DIAGNOSIS — N760 Acute vaginitis: Secondary | ICD-10-CM | POA: Insufficient documentation

## 2017-03-08 DIAGNOSIS — J45909 Unspecified asthma, uncomplicated: Secondary | ICD-10-CM | POA: Insufficient documentation

## 2017-03-08 LAB — URINALYSIS, ROUTINE W REFLEX MICROSCOPIC
Bilirubin Urine: NEGATIVE
GLUCOSE, UA: NEGATIVE mg/dL
Hgb urine dipstick: NEGATIVE
Ketones, ur: NEGATIVE mg/dL
Nitrite: NEGATIVE
PH: 8 (ref 5.0–8.0)
Protein, ur: NEGATIVE mg/dL
SPECIFIC GRAVITY, URINE: 1.011 (ref 1.005–1.030)

## 2017-03-08 LAB — HIV ANTIBODY (ROUTINE TESTING W REFLEX): HIV SCREEN 4TH GENERATION: NONREACTIVE

## 2017-03-08 LAB — WET PREP, GENITAL
Sperm: NONE SEEN
YEAST WET PREP: NONE SEEN

## 2017-03-08 LAB — POC URINE PREG, ED: Preg Test, Ur: NEGATIVE

## 2017-03-08 LAB — RPR: RPR Ser Ql: NONREACTIVE

## 2017-03-08 MED ORDER — AZITHROMYCIN 250 MG PO TABS
1000.0000 mg | ORAL_TABLET | Freq: Once | ORAL | Status: AC
  Administered 2017-03-08: 1000 mg via ORAL
  Filled 2017-03-08: qty 4

## 2017-03-08 MED ORDER — CEFTRIAXONE SODIUM 250 MG IJ SOLR
250.0000 mg | Freq: Once | INTRAMUSCULAR | Status: AC
  Administered 2017-03-08: 250 mg via INTRAMUSCULAR
  Filled 2017-03-08: qty 250

## 2017-03-08 MED ORDER — METRONIDAZOLE 500 MG PO TABS: 500.0000 mg | ORAL_TABLET | Freq: Two times a day (BID) | ORAL | 0 refills | Status: DC

## 2017-03-08 MED ORDER — LIDOCAINE HCL (PF) 1 % IJ SOLN
INTRAMUSCULAR | Status: AC
  Administered 2017-03-08: 1 mL
  Filled 2017-03-08: qty 5

## 2017-03-08 NOTE — ED Notes (Signed)
Patient stated tonight she was told by her boyfriend that he was contacted by a female he was seeing while they were not together that he needed to be checked.  Patient stated she ended her cycle on Friday and Saturday she noticed a brownish discharge.  She does have issues with BV since having her daughter but they resolve in 2-3 days.

## 2017-03-08 NOTE — Discharge Instructions (Signed)
Please follow up on your test results in about three days on MyChart.

## 2017-03-08 NOTE — ED Notes (Signed)
Discharge instructions and prescription reviewed 

## 2017-03-08 NOTE — ED Triage Notes (Signed)
Pt reports exposure to STD, states vaginal discharge and odor. States boyfriend actively being treated for STD - unsure what kind. States odor similar to BV - reports hx of same.

## 2017-03-08 NOTE — ED Provider Notes (Signed)
[facility] EMERGENCY DEPARTMENT Provider Note   CSN: [id number] Arrival date & time: 03/08/17  0221     History   Chief Complaint Chief Complaint  Patient presents with  . SEXUALLY TRANSMITTED DISEASE    HPI Jody Palmer is a 27 y.o. female.  Jody Palmer is a 27 y.o. Female who presents the emergency department with concern for STD.  Patient reports that she was recently told by her boyfriend that he might have an STD.  Patient reports she is been having some vaginal discharge ongoing for several weeks that is malodorous.  She denies other complaints or symptoms.  No treatments attempted prior to arrival.  Last menstrual cycle was 02/26/2017.  She denies of fevers, abdominal pain, nausea, vomiting, diarrhea, mouth sores, vaginal bleeding, urinary symptoms or rashes.   The history is provided by the patient and medical records. No language interpreter was used.    Past Medical History:  Diagnosis Date  . Asthma   . Gastroschisis   . Heart murmur    at birth, no problems since  . SBO (small bowel obstruction) (HCC)    this was during her first year of life, no problems since    Patient Active Problem List   Diagnosis Date Noted  . Pregnancy 03/21/2014  . Spontaneous vaginal delivery 03/21/2014    Past Surgical History:  Procedure Laterality Date  . ABDOMINAL SURGERY    . GASTROSCHISIS CLOSURE  1991  . TONSILLECTOMY      OB History    Gravida Para Term Preterm AB Living   2 1 1     1    SAB TAB Ectopic Multiple Live Births         0 1       Home Medications    Prior to Admission medications   Medication Sig Start Date End Date Taking? Authorizing Provider  acetaminophen (TYLENOL) 500 MG tablet Take 1,000 mg by mouth every 6 (six) hours as needed for moderate pain.    [provider]  albuterol (PROVENTIL HFA;VENTOLIN HFA) 108 (90 BASE) MCG/ACT inhaler Inhale 1 puff into the lungs every 6 (six) hours as needed for wheezing or  shortness of breath.    [provider]  ibuprofen (ADVIL,MOTRIN) 800 MG tablet Take 1 tablet (800 mg total) by mouth every 8 (eight) hours as needed. 06/10/16   Lawyer, Harrell Gave, PA-C  metroNIDAZOLE (FLAGYL) 500 MG tablet Take 1 tablet (500 mg total) by mouth 2 (two) times daily. 03/08/17   Waynetta Pean, PA-C    Family History Family History  Problem Relation Age of Onset  . Diabetes Father   . Hypertension Father     Social History Social History   Tobacco Use  . Smoking status: Current Every Day Smoker    Types: Cigars  . Smokeless tobacco: Never Used  Substance Use Topics  . Alcohol use: Yes    Comment: occ  . Drug use: Yes    Types: Marijuana    Comment: last use May 2015     Allergies   Patient has no known allergies.   Review of Systems Review of Systems  Constitutional: Negative for fever.  HENT: Negative for mouth sores and sore throat.   Eyes: Negative for visual disturbance.  Respiratory: Negative for cough and shortness of breath.   Cardiovascular: Negative for chest pain.  Gastrointestinal: Negative for abdominal pain, diarrhea, nausea and vomiting.  Genitourinary: Positive for vaginal discharge. Negative for decreased urine volume, dysuria, frequency, genital  sores, menstrual problem, pelvic pain, urgency and vaginal bleeding.  Musculoskeletal: Negative for back pain.  Skin: Negative for rash.  Neurological: Negative for headaches.     Physical Exam Updated Vital Signs BP (!) 136/98 (BP Location: Right Arm)   Pulse 82   Temp 98 F (36.7 C) (Oral)   Resp 18   Ht 5\' 8"  (1.727 m)   Wt 108 kg (238 lb)   SpO2 100%   BMI 36.19 kg/m   Physical Exam  Constitutional: She appears well-developed and well-nourished. No distress.  HENT:  Head: Normocephalic and atraumatic.  Mouth/Throat: Oropharynx is clear and moist.  Eyes: Right eye exhibits no discharge. Left eye exhibits no discharge.  Neck: Neck supple.  Cardiovascular: Normal  rate, regular rhythm, normal heart sounds and intact distal pulses.  Pulmonary/Chest: Effort normal and breath sounds normal. No respiratory distress.  Abdominal: Soft. She exhibits no mass. There is no tenderness. There is no guarding.  Genitourinary: Vaginal discharge found.  Genitourinary Comments: Pelvic exam with female RN as chaperone.  No external lesions or rashes noted.  Cervix is closed.  No cervical motion tenderness.  White vaginal discharge noted.  No adnexal tenderness or fullness.  Musculoskeletal: She exhibits no edema.  Lymphadenopathy:    She has no cervical adenopathy.  Neurological: She is alert. Coordination normal.  Skin: Skin is warm and dry. No rash noted. She is not diaphoretic. No erythema. No pallor.  Psychiatric: She has a normal mood and affect. Her behavior is normal.  Nursing note and vitals reviewed.    ED Treatments / Results  Labs (all labs ordered are listed, but only abnormal results are displayed) Labs Reviewed  WET PREP, GENITAL - Abnormal; Notable for the following components:      Result Value   Trich, Wet Prep PRESENT (*)    Clue Cells Wet Prep HPF POC PRESENT (*)    WBC, Wet Prep HPF POC MANY (*)    All other components within normal limits  URINALYSIS, ROUTINE W REFLEX MICROSCOPIC - Abnormal; Notable for the following components:   Color, Urine STRAW (*)    Leukocytes, UA SMALL (*)    Bacteria, UA RARE (*)    Squamous Epithelial / LPF 0-5 (*)    All other components within normal limits  RPR  HIV ANTIBODY (ROUTINE TESTING)  POC URINE PREG, ED  GC/CHLAMYDIA PROBE AMP ([place]) NOT AT [facility]    EKG  EKG Interpretation None       Radiology No results found.  Procedures Procedures (including critical care time)  Medications Ordered in ED Medications  cefTRIAXone (ROCEPHIN) injection 250 mg (not administered)  azithromycin (ZITHROMAX) tablet 1,000 mg (not administered)     Initial Impression / Assessment and Plan / ED  Course  I have reviewed the triage vital signs and the nursing notes.  Pertinent labs & imaging results that were available during my care of the patient were reviewed by me and considered in my medical decision making (see chart for details).    This  is a 27 y.o. Female who presents the emergency department with concern for STD.  Patient reports that she was recently told by her boyfriend that he might have an STD.  Patient reports she is been having some vaginal discharge ongoing for several weeks that is malodorous.  She denies other complaints or symptoms. On exam the patient is afebrile nontoxic-appearing.  Her abdomen is soft nontender to palpation.  On pelvic exam she has white vaginal  discharge.  No cervical motion tenderness.  No adnexal tenderness or fullness. Pregnancy test is negative. Wet prep shows Trichomonas, clue cells and many white blood cells.   I discussed findings with the patient.  We will treat for bacterial vaginosis and trichomonas with Flagyl.  We will also cover for gonorrhea and chlamydia with Rocephin and azithromycin in the.  She agreed for testing for HIV and syphilis.  I advised that she has pending testing for gonorrhea, chlamydia, HIV and syphilis.  I encouraged her to follow-up on these test results in about 3 days.  I discussed safe sex practices.  I encouraged her to follow-up with the health department in about 1 week.  I encouraged her to notify all sexual partners. I advised the patient to follow-up with their primary care provider this week. I advised the patient to return to the emergency department with new or worsening symptoms or new concerns. The patient verbalized understanding and agreement with plan.      Final Clinical Impressions(s) / ED Diagnoses   Final diagnoses:  Trichimoniasis  BV (bacterial vaginosis)  Concern about STD in female without diagnosis    ED Discharge Orders        Ordered    metroNIDAZOLE (FLAGYL) 500 MG tablet  2 times  daily     03/08/17 0514       Waynetta Pean, PA-C 03/08/17 7672    Orpah Greek, MD 03/08/17 [phone]

## 2017-03-09 LAB — GC/CHLAMYDIA PROBE AMP (~~LOC~~) NOT AT ARMC
Chlamydia: NEGATIVE
Neisseria Gonorrhea: NEGATIVE

## 2017-05-15 ENCOUNTER — Inpatient Hospital Stay (HOSPITAL_COMMUNITY)
Admission: AD | Admit: 2017-05-15 | Discharge: 2017-05-16 | Disposition: A | Discharge status: Home / Self Care | Payer: Medicaid Other | Source: Ambulatory Visit | Attending: Obstetrics and Gynecology | Admitting: Obstetrics and Gynecology

## 2017-05-15 ENCOUNTER — Encounter (HOSPITAL_COMMUNITY): Payer: Self-pay

## 2017-05-15 DIAGNOSIS — O99511 Diseases of the respiratory system complicating pregnancy, first trimester: Secondary | ICD-10-CM | POA: Diagnosis not present

## 2017-05-15 DIAGNOSIS — J45909 Unspecified asthma, uncomplicated: Secondary | ICD-10-CM | POA: Diagnosis not present

## 2017-05-15 DIAGNOSIS — Z79899 Other long term (current) drug therapy: Secondary | ICD-10-CM | POA: Diagnosis not present

## 2017-05-15 DIAGNOSIS — O99331 Smoking (tobacco) complicating pregnancy, first trimester: Secondary | ICD-10-CM | POA: Diagnosis not present

## 2017-05-15 DIAGNOSIS — Z202 Contact with and (suspected) exposure to infections with a predominantly sexual mode of transmission: Secondary | ICD-10-CM

## 2017-05-15 DIAGNOSIS — F17290 Nicotine dependence, other tobacco product, uncomplicated: Secondary | ICD-10-CM | POA: Insufficient documentation

## 2017-05-15 DIAGNOSIS — Z3A01 Less than 8 weeks gestation of pregnancy: Secondary | ICD-10-CM | POA: Insufficient documentation

## 2017-05-15 DIAGNOSIS — O26891 Other specified pregnancy related conditions, first trimester: Secondary | ICD-10-CM | POA: Diagnosis not present

## 2017-05-15 DIAGNOSIS — N898 Other specified noninflammatory disorders of vagina: Secondary | ICD-10-CM | POA: Diagnosis present

## 2017-05-15 LAB — OB RESULTS CONSOLE GC/CHLAMYDIA: GC PROBE AMP, GENITAL: NEGATIVE

## 2017-05-15 LAB — URINALYSIS, ROUTINE W REFLEX MICROSCOPIC
Bacteria, UA: NONE SEEN
Bilirubin Urine: NEGATIVE
Glucose, UA: NEGATIVE mg/dL
Hgb urine dipstick: NEGATIVE
Ketones, ur: NEGATIVE mg/dL
Nitrite: NEGATIVE
Protein, ur: NEGATIVE mg/dL
Specific Gravity, Urine: 1.017 (ref 1.005–1.030)
pH: 5 (ref 5.0–8.0)

## 2017-05-15 LAB — WET PREP, GENITAL
Clue Cells Wet Prep HPF POC: NONE SEEN
Sperm: NONE SEEN
Trich, Wet Prep: NONE SEEN
Yeast Wet Prep HPF POC: NONE SEEN

## 2017-05-15 LAB — POCT PREGNANCY, URINE: PREG TEST UR: POSITIVE — AB

## 2017-05-15 NOTE — MAU Provider Note (Signed)
Chief Complaint: Vaginal Discharge  SUBJECTIVE HPI: Jody Palmer is a 27 y.o. G2P1001 at [redacted]w[redacted]d by LMP who presents to maternity admissions reporting vaginal discharge. She reports vaginal discharge for the past week- she reports the vaginal discharge being white thick discharge. She has been tx herself with OTC Monastat for yeast infection. She reports starting the tx 3 days ago when the discharge was itching and irritated- she reports feeling better this morning with no irritation but reports the white thin discharge is still present. She was treated for trich 3 months ago and did not wait the full 10 days prior to having sex with her partner. She request STD testing today. She denies vaginal bleeding, abdominal cramping or pain, urinary symptoms, h/a, dizziness, n/v, or fever/chills.     Past Medical History:  Diagnosis Date  . Asthma   . Gastroschisis   . Heart murmur    at birth, no problems since  . SBO (small bowel obstruction) (HCC)    this was during her first year of life, no problems since   Past Surgical History:  Procedure Laterality Date  . ABDOMINAL SURGERY    . GASTROSCHISIS CLOSURE  1991  . TONSILLECTOMY     Social History   Socioeconomic History  . Marital status: Single    Spouse name: Not on file  . Number of children: Not on file  . Years of education: Not on file  . Highest education level: Not on file  Occupational History  . Not on file  Social Needs  . Financial resource strain: Not on file  . Food insecurity:    Worry: Not on file    Inability: Not on file  . Transportation needs:    Medical: Not on file    Non-medical: Not on file  Tobacco Use  . Smoking status: Current Every Day Smoker    Types: Cigars  . Smokeless tobacco: Never Used  Substance and Sexual Activity  . Alcohol use: Yes    Comment: occ  . Drug use: Yes    Types: Marijuana    Comment: last use May 2015  . Sexual activity: Yes    Birth control/protection: None  Lifestyle  .  Physical activity:    Days per week: Not on file    Minutes per session: Not on file  . Stress: Not on file  Relationships  . Social connections:    Talks on phone: Not on file    Gets together: Not on file    Attends religious service: Not on file    Active member of club or organization: Not on file    Attends meetings of clubs or organizations: Not on file    Relationship status: Not on file  . Intimate partner violence:    Fear of current or ex partner: Not on file    Emotionally abused: Not on file    Physically abused: Not on file    Forced sexual activity: Not on file  Other Topics Concern  . Not on file  Social History Narrative  . Not on file   No current facility-administered medications on file prior to encounter.    Current Outpatient Medications on File Prior to Encounter  Medication Sig Dispense Refill  . acetaminophen (TYLENOL) 500 MG tablet Take 1,000 mg by mouth every 6 (six) hours as needed for moderate pain.    Marland Kitchen albuterol (PROVENTIL HFA;VENTOLIN HFA) 108 (90 BASE) MCG/ACT inhaler Inhale 1 puff into the lungs every 6 (six) hours as  needed for wheezing or shortness of breath.    Marland Kitchen ibuprofen (ADVIL,MOTRIN) 800 MG tablet Take 1 tablet (800 mg total) by mouth every 8 (eight) hours as needed. 21 tablet 0  . metroNIDAZOLE (FLAGYL) 500 MG tablet Take 1 tablet (500 mg total) by mouth 2 (two) times daily. 14 tablet 0   No Known Allergies  ROS:  Review of Systems  Constitutional: Negative.   Respiratory: Negative.   Cardiovascular: Negative.   Gastrointestinal: Negative.   Genitourinary: Positive for vaginal discharge. Negative for difficulty urinating, dysuria, frequency, urgency and vaginal bleeding.   I have reviewed patient's Past Medical Hx, Surgical Hx, Family Hx, Social Hx, medications and allergies.   Physical Exam   Patient Vitals for the past 24 hrs:  BP Temp Temp src Pulse Resp SpO2 Height Weight  05/15/17 2214 (!) 146/82 98.4 F (36.9 C) Oral 83 18  99 % 5\' 8"  (1.727 m) 246 lb (111.6 kg)   Constitutional: Well-developed, well-nourished female in no acute distress.  Cardiovascular: normal rate Respiratory: normal effort GI: Abd soft, non-tender. Pos BS x 4 MS: Extremities nontender, no edema, normal ROM Neurologic: Alert and oriented x 4.  GU: Neg CVAT.  PELVIC EXAM: Cervix pink, scant white creamy discharge, vaginal walls and external genitalia normal  LAB RESULTS Results for orders placed or performed during the hospital encounter of 05/15/17 (from the past 24 hour(s))  Wet prep, genital     Status: Abnormal   Collection Time: 05/15/17 10:09 PM  Result Value Ref Range   Yeast Wet Prep HPF POC NONE SEEN NONE SEEN   Trich, Wet Prep NONE SEEN NONE SEEN   Clue Cells Wet Prep HPF POC NONE SEEN NONE SEEN   WBC, Wet Prep HPF POC MODERATE (A) NONE SEEN   Sperm NONE SEEN   Urinalysis, Routine w reflex microscopic     Status: Abnormal   Collection Time: 05/15/17 10:09 PM  Result Value Ref Range   Color, Urine YELLOW YELLOW   APPearance CLEAR CLEAR   Specific Gravity, Urine 1.017 1.005 - 1.030   pH 5.0 5.0 - 8.0   Glucose, UA NEGATIVE NEGATIVE mg/dL   Hgb urine dipstick NEGATIVE NEGATIVE   Bilirubin Urine NEGATIVE NEGATIVE   Ketones, ur NEGATIVE NEGATIVE mg/dL   Protein, ur NEGATIVE NEGATIVE mg/dL   Nitrite NEGATIVE NEGATIVE   Leukocytes, UA LARGE (A) NEGATIVE   RBC / HPF 0-5 0 - 5 RBC/hpf   WBC, UA 0-5 0 - 5 WBC/hpf   Bacteria, UA NONE SEEN NONE SEEN   Squamous Epithelial / LPF 0-5 (A) NONE SEEN   Mucus PRESENT   Pregnancy, urine POC     Status: Abnormal   Collection Time: 05/15/17 10:47 PM  Result Value Ref Range   Preg Test, Ur POSITIVE (A) NEGATIVE    MAU Management/MDM: Orders Placed This Encounter  Procedures  . Wet prep, genital  . Urinalysis, Routine w reflex microscopic  . RPR  . HIV antibody  . Pregnancy, urine POC  . Discharge patient Discharge disposition: 01-Home or Self Care; Discharge patient date:  05/15/2017  Wet prep- negative  UA- negative  RPR and HIV pending   Pt discharged. Pt stable at time of discharge.   ASSESSMENT 1. Vaginal discharge during pregnancy in first trimester   2. Possible exposure to STD     PLAN Discharge home Follow up as scheduled at the HD for prenatal appointments  Return to MAU as needed for worsening symptoms   Follow-up Information  Department, [facility] Follow up.   Why:  Follow up as scheduled for prenatal appointments  Contact information: [place] [address] [phone]           Allergies as of 05/15/2017   No Known Allergies     Medication List    STOP taking these medications   ibuprofen 800 MG tablet Commonly known as:  ADVIL,MOTRIN   metroNIDAZOLE 500 MG tablet Commonly known as:  FLAGYL     TAKE these medications   acetaminophen 500 MG tablet Commonly known as:  TYLENOL Take 1,000 mg by mouth every 6 (six) hours as needed for moderate pain.   albuterol 108 (90 Base) MCG/ACT inhaler Commonly known as:  PROVENTIL HFA;VENTOLIN HFA Inhale 1 puff into the lungs every 6 (six) hours as needed for wheezing or shortness of breath.       Darrol Poke  Certified Nurse-Midwife 05/16/2017  12:54 AM

## 2017-05-15 NOTE — MAU Note (Signed)
Pt states she is [redacted] weeks pregnant. States she has been treating herself for yeast infection with OTC medication. Has not helped. States the discharge is itchy and irritating. States she was treated for trich about 3 months-both she and partner were treated. States she does want to be tested for STI's. Pt denies vaginal bleeding. LMP: 03/26/2017

## 2017-05-16 LAB — GC/CHLAMYDIA PROBE AMP (~~LOC~~) NOT AT ARMC
Chlamydia: NEGATIVE
Neisseria Gonorrhea: NEGATIVE

## 2017-05-16 LAB — RPR: RPR Ser Ql: NONREACTIVE

## 2017-05-16 LAB — HIV ANTIBODY (ROUTINE TESTING W REFLEX): HIV Screen 4th Generation wRfx: NONREACTIVE

## 2017-06-09 LAB — OB RESULTS CONSOLE ABO/RH: RH Type: POSITIVE

## 2017-06-09 LAB — CYTOLOGY - PAP
GLUCOSE 1 HOUR: 98
PAP SMEAR: NEGATIVE
Urine Culture, OB: NEGATIVE

## 2017-06-09 LAB — OB RESULTS CONSOLE PLATELET COUNT: PLATELETS: 303

## 2017-06-09 LAB — OB RESULTS CONSOLE HGB/HCT, BLOOD
HCT: 34
Hemoglobin: 11.3

## 2017-06-09 LAB — OB RESULTS CONSOLE ANTIBODY SCREEN: Antibody Screen: NEGATIVE

## 2017-06-09 LAB — OB RESULTS CONSOLE RUBELLA ANTIBODY, IGM: RUBELLA: IMMUNE

## 2017-06-15 ENCOUNTER — Other Ambulatory Visit (HOSPITAL_COMMUNITY): Payer: Self-pay | Admitting: Nurse Practitioner

## 2017-06-15 DIAGNOSIS — Z87738 Personal history of other specified (corrected) congenital malformations of digestive system: Secondary | ICD-10-CM

## 2017-06-15 DIAGNOSIS — Z369 Encounter for antenatal screening, unspecified: Secondary | ICD-10-CM

## 2017-06-15 DIAGNOSIS — Z87761 Personal history of (corrected) gastroschisis: Secondary | ICD-10-CM

## 2017-06-15 DIAGNOSIS — Z3A13 13 weeks gestation of pregnancy: Secondary | ICD-10-CM

## 2017-06-21 ENCOUNTER — Inpatient Hospital Stay (HOSPITAL_COMMUNITY)
Admission: AD | Admit: 2017-06-21 | Discharge: 2017-06-21 | Disposition: A | Discharge status: Home / Self Care | Payer: Medicaid Other | Source: Ambulatory Visit | Attending: Obstetrics and Gynecology | Admitting: Obstetrics and Gynecology

## 2017-06-21 ENCOUNTER — Encounter (HOSPITAL_COMMUNITY): Payer: Self-pay | Admitting: *Deleted

## 2017-06-21 DIAGNOSIS — O26891 Other specified pregnancy related conditions, first trimester: Secondary | ICD-10-CM | POA: Insufficient documentation

## 2017-06-21 DIAGNOSIS — O10011 Pre-existing essential hypertension complicating pregnancy, first trimester: Secondary | ICD-10-CM | POA: Diagnosis not present

## 2017-06-21 DIAGNOSIS — R42 Dizziness and giddiness: Secondary | ICD-10-CM

## 2017-06-21 DIAGNOSIS — Z3A12 12 weeks gestation of pregnancy: Secondary | ICD-10-CM | POA: Diagnosis not present

## 2017-06-21 DIAGNOSIS — E860 Dehydration: Secondary | ICD-10-CM | POA: Insufficient documentation

## 2017-06-21 DIAGNOSIS — R109 Unspecified abdominal pain: Secondary | ICD-10-CM | POA: Diagnosis not present

## 2017-06-21 DIAGNOSIS — O10911 Unspecified pre-existing hypertension complicating pregnancy, first trimester: Secondary | ICD-10-CM

## 2017-06-21 DIAGNOSIS — Z87891 Personal history of nicotine dependence: Secondary | ICD-10-CM | POA: Insufficient documentation

## 2017-06-21 DIAGNOSIS — Z7982 Long term (current) use of aspirin: Secondary | ICD-10-CM | POA: Diagnosis not present

## 2017-06-21 HISTORY — DX: Essential (primary) hypertension: I10

## 2017-06-21 LAB — COMPREHENSIVE METABOLIC PANEL
ALT: 10 U/L — ABNORMAL LOW (ref 14–54)
ANION GAP: 9 (ref 5–15)
AST: 14 U/L — ABNORMAL LOW (ref 15–41)
Albumin: 3.1 g/dL — ABNORMAL LOW (ref 3.5–5.0)
Alkaline Phosphatase: 47 U/L (ref 38–126)
BUN: 12 mg/dL (ref 6–20)
CO2: 21 mmol/L — ABNORMAL LOW (ref 22–32)
Calcium: 8.7 mg/dL — ABNORMAL LOW (ref 8.9–10.3)
Chloride: 103 mmol/L (ref 101–111)
Creatinine, Ser: 0.68 mg/dL (ref 0.44–1.00)
GFR calc Af Amer: >60 mL/min (ref >60)
Glucose, Bld: 87 mg/dL (ref 65–99)
Potassium: 3.6 mmol/L (ref 3.5–5.1)
Sodium: 133 mmol/L — ABNORMAL LOW (ref 135–145)
TOTAL PROTEIN: 7.6 g/dL (ref 6.5–8.1)
Total Bilirubin: <0.1 mg/dL — ABNORMAL LOW (ref 0.3–1.2)

## 2017-06-21 LAB — CBC
HCT: 32.8 % — ABNORMAL LOW (ref 36.0–46.0)
Hemoglobin: 11.7 g/dL — ABNORMAL LOW (ref 12.0–15.0)
MCH: 29.3 pg (ref 26.0–34.0)
MCHC: 35.7 g/dL (ref 30.0–36.0)
MCV: 82 fL (ref 78.0–100.0)
Platelets: 264 10*3/uL (ref 150–400)
RBC: 4 MIL/uL (ref 3.87–5.11)
RDW: 14 % (ref 11.5–15.5)
WBC: 5.9 10*3/uL (ref 4.0–10.5)

## 2017-06-21 LAB — URINALYSIS, ROUTINE W REFLEX MICROSCOPIC
Bacteria, UA: NONE SEEN
Bilirubin Urine: NEGATIVE
GLUCOSE, UA: NEGATIVE mg/dL
HGB URINE DIPSTICK: NEGATIVE
Ketones, ur: 80 mg/dL — AB
NITRITE: NEGATIVE
Protein, ur: NEGATIVE mg/dL
SPECIFIC GRAVITY, URINE: 1.017 (ref 1.005–1.030)
pH: 5 (ref 5.0–8.0)

## 2017-06-21 MED ORDER — DEXTROSE 5 % IN LACTATED RINGERS IV BOLUS
1000.0000 mL | Freq: Once | INTRAVENOUS | Status: AC
  Administered 2017-06-21: 1000 mL via INTRAVENOUS

## 2017-06-21 NOTE — MAU Note (Signed)
Pt C/O dizziness for the past two days, started on new BP med approximately 2 weeks ago.

## 2017-06-21 NOTE — MAU Note (Signed)
Pt reports pain in lower abd radiating to right side, pain started at 12 tonight. Denies bleeding.

## 2017-06-21 NOTE — MAU Provider Note (Addendum)
History   [name] is a 27 yo female G3P1 that presents today to the MAU for abdominal pain and dizziness.  She has a history of Trichomonas infections. Her last pregnancy was uncomplicated and delivered vaginally at term. 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 She has been recently started on Procardia by the health department where she receives prenatal care.   Abdominal pain started at midnight and worsened over the two hours. The pain felt different than her normal soreness/cramping in pregnancy. 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 Pain was worse with walking but occurred when seated as well. Sharp intermittent (30 second) pain in the RLQ that radiated up the right side. She also endorses some minor nausea that she reports is normal for her during pregnancy and is unchanged. Her last BM was this morning and she reports it as normal. She denies recent illness, fever, chills, vomiting, diarrhea, constipation, hematochezia, melena, vaginal bleeding, and vaginal discharge.   She states that she has felt dizzy/"lethargic feeling" for the last 3 days that comes and goes. She is still experiencing some headaches "pressure behind her eyes". She thinks it is due to her blood pressure medication. She has had a decreased appetite the last few days, but states she has still been eating well.   CSN: [id number]  Arrival date and time: 06/21/17 4098   None     Chief Complaint  Patient presents with  . Abdominal Pain  . Dizziness   HPI  OB History    Gravida  3   Para  1   Term  1   Preterm      AB  1   Living  1     SAB      TAB  1   Ectopic      Multiple  0   Live Births  1           Past Medical History:  Diagnosis Date  . Asthma   . Gastroschisis   . Heart murmur    at birth, no problems since  . Hypertension   . SBO (small bowel obstruction) (HCC)    this was during her first year of life, no problems since    Past  Surgical History:  Procedure Laterality Date  . ABDOMINAL SURGERY    . GASTROSCHISIS CLOSURE  1991  . TONSILLECTOMY      Family History  Problem Relation Age of Onset  . Diabetes Father   . Hypertension Father     Social History   Tobacco Use  . Smoking status: Former Smoker    Types: Cigars  . Smokeless tobacco: Never Used  Substance Use Topics  . Alcohol use: Not Currently    Comment: occ  . Drug use: Not Currently    Types: Marijuana    Comment: last use May 2015    Allergies: No Known Allergies  Medications Prior to Admission  Medication Sig Dispense Refill Last Dose  . acetaminophen (TYLENOL) 325 MG tablet Take 650-975 mg by mouth every 6 (six) hours as needed for mild pain or headache.   06/20/2017 at Unknown time  . aspirin 81 MG chewable tablet Chew 81 mg by mouth daily.   06/20/2017 at Unknown time  . NIFEdipine (PROCARDIA-XL/ADALAT-CC/NIFEDICAL-XL) 30 MG 24 hr tablet Take 30 mg by mouth daily.  11 06/20/2017 at Unknown time  . Prenatal Vit-Fe Fumarate-FA (PRENATAL MULTIVITAMIN) TABS tablet Take 1 tablet by  mouth daily at 12 noon.   06/20/2017 at Unknown time  . albuterol (PROVENTIL HFA;VENTOLIN HFA) 108 (90 BASE) MCG/ACT inhaler Inhale 1 puff into the lungs every 6 (six) hours as needed for wheezing or shortness of breath.   rescue    Review of Systems  Constitutional: Positive for appetite change and fatigue. Negative for chills and fever.  Respiratory: Negative.   Cardiovascular: Negative for chest pain, palpitations and leg swelling.  Gastrointestinal: Positive for abdominal pain and nausea. Negative for abdominal distention, blood in stool, constipation, diarrhea and vomiting.  Genitourinary: Negative for difficulty urinating, dysuria, frequency, hematuria, vaginal bleeding, vaginal discharge and vaginal pain.  Skin: Negative.   Neurological: Positive for dizziness and headaches. Negative for seizures, syncope and facial asymmetry.   Physical Exam   Blood  pressure 127/88, pulse 90, temperature 98.9 F (37.2 C), temperature source Oral, resp. rate 18, height 5\' 8"  (1.727 m), weight 112.5 kg (248 lb), last menstrual period 03/26/2017, SpO2 100 %.  Physical Exam  Constitutional: She appears well-developed and well-nourished. No distress.  HENT:  Head: Normocephalic and atraumatic.  Cardiovascular: Normal rate, regular rhythm, normal heart sounds and intact distal pulses. Exam reveals no gallop and no friction rub.  No murmur heard. Respiratory: Effort normal and breath sounds normal.  GI: Soft. Bowel sounds are normal. She exhibits no distension and no mass. There is tenderness in the right lower quadrant. There is no rigidity, no rebound, no guarding and no CVA tenderness.  Large abdominal scar   Skin: Skin is warm and dry. She is not diaphoretic.    MAU Course  Procedures   Results for orders placed or performed during the hospital encounter of 06/21/17 (from the past 24 hour(s))  Urinalysis, Routine w reflex microscopic     Status: Abnormal   Collection Time: 06/21/17  8:55 AM  Result Value Ref Range   Color, Urine YELLOW YELLOW   APPearance CLEAR CLEAR   Specific Gravity, Urine 1.017 1.005 - 1.030   pH 5.0 5.0 - 8.0   Glucose, UA NEGATIVE NEGATIVE mg/dL   Hgb urine dipstick NEGATIVE NEGATIVE   Bilirubin Urine NEGATIVE NEGATIVE   Ketones, ur 80 (A) NEGATIVE mg/dL   Protein, ur NEGATIVE NEGATIVE mg/dL   Nitrite NEGATIVE NEGATIVE   Leukocytes, UA TRACE (A) NEGATIVE   RBC / HPF 0-5 0 - 5 RBC/hpf   WBC, UA 0-5 0 - 5 WBC/hpf   Bacteria, UA NONE SEEN NONE SEEN   Squamous Epithelial / LPF 0-5 0 - 5   Mucus PRESENT   CBC     Status: Abnormal   Collection Time: 06/21/17 10:42 AM  Result Value Ref Range   WBC 5.9 4.0 - 10.5 K/uL   RBC 4.00 3.87 - 5.11 MIL/uL   Hemoglobin 11.7 (L) 12.0 - 15.0 g/dL   HCT 32.8 (L) 36.0 - 46.0 %   MCV 82.0 78.0 - 100.0 fL   MCH 29.3 26.0 - 34.0 pg   MCHC 35.7 30.0 - 36.0 g/dL   RDW 14.0 11.5 - 15.5 %    Platelets 264 150 - 400 K/uL  Comprehensive metabolic panel     Status: Abnormal (Preliminary result)   Collection Time: 06/21/17 10:42 AM  Result Value Ref Range   Sodium 133 (L) 135 - 145 mmol/L   Potassium 3.6 3.5 - 5.1 mmol/L   Chloride 103 101 - 111 mmol/L   CO2 21 (L) 22 - 32 mmol/L   Glucose, Bld 87 65 - 99 mg/dL  BUN 12 6 - 20 mg/dL   Creatinine, Ser 0.68 0.44 - 1.00 mg/dL   Calcium 8.7 (L) 8.9 - 10.3 mg/dL   Total Protein 7.6 6.5 - 8.1 g/dL   Albumin 3.1 (L) 3.5 - 5.0 g/dL   AST 14 (L) 15 - 41 U/L   ALT 10 (L) 14 - 54 U/L   Alkaline Phosphatase 47 38 - 126 U/L   Total Bilirubin PENDING 0.3 - 1.2 mg/dL   GFR calc non Af Amer >60 >60 mL/min   GFR calc Af Amer >60 >60 mL/min   Anion gap 9 5 - 15   MDM Orders Placed This Encounter  Procedures  . Urinalysis, Routine w reflex microscopic    Standing Status:   Standing    Number of Occurrences:   1  . CBC    Standing Status:   Standing    Number of Occurrences:   1  . Comprehensive metabolic panel    Standing Status:   Standing    Number of Occurrences:   1   Meds ordered this encounter  Medications  . dextrose 5% lactated ringers bolus 1,000 mL      Appendicitis unlikely due to afebrile, WBC 5.9, no new changes in her normal nausea and no vomiting.    Assessment and Plan  A Abdominal pain in first trimester of pregnancy  Dehydration from decreased oral intake  Fatigue in the first trimester of pregnancy    P Discharge home, Stable  Patient advised to follow-up with her normal prenatal care Made aware to return to MAU with new or worsening symptoms.   Jody Palmer 06/21/2017, 11:29 AM   CNM attestation:  I have seen and examined this patient and agree with above documentation in the PA's note.   Jody Palmer is a 27 y.o. G3P1011 at [redacted]w[redacted]d reporting onset of sharp lower abdominal cramping bilaterally, but more on the right side while working last night. Associated symptoms include decreased  appetite/nausea x 2 days. She usually eats and drinks well during this pregnancy. She denies vomiting. Denies LOF, VB, contractions, vaginal discharge.  PE: Patient Vitals for the past 24 hrs:  BP Temp Temp src Pulse Resp SpO2 Height Weight  06/21/17 0900 127/88 98.9 F (37.2 C) Oral 90 18 100 % 5\' 8"  (1.727 m) 248 lb (112.5 kg)   Gen: calm comfortable, NAD Resp: normal effort, no distress Heart: Regular rate Abd: Soft, NT, no rebound tenderness or guarding MS: full ROM, no CVA tenderness  FHT: 152 by doppler  Dilation: Closed Cervical Position: Posterior Exam by:: L. Leftwich-Kirby CNM  ROS, labs, PMH reviewed  Orders Placed This Encounter  Procedures  . Urinalysis, Routine w reflex microscopic  . CBC  . Comprehensive metabolic panel  . Discharge patient   Meds ordered this encounter  Medications  . dextrose 5% lactated ringers bolus 1,000 mL    MDM With no elevated WBCs, no vomiting, and no rebound tenderness, unlikely appendicitis.  FHT normal, cervix closed and thick and no bleeding so no evidence of miscarriage. Pt with acute appetite changes and nausea so may be GI in nature.  Pt dehydrated in MAU so pain may be related to dehydration from lack of eating/drinking in last 24 hours.  Pt pain improved in MAU with IV fluids only.  She declines any nausea medication, reporting she feels well.  Pt to remain on Procardia for [facility] as prescribed.  D/C home with abdominal pain precautions. F/U in the office as scheduled.  Assessment: 1. Abdominal pain during pregnancy, first trimester   2. Dizziness   3. Chronic hypertension in obstetric context in first trimester     Plan: - Discharge home in stable condition. - Return to maternity admissions symptoms worsen  Jody Palmer, CNM 06/21/2017 12:04 PM

## 2017-06-27 ENCOUNTER — Encounter (HOSPITAL_COMMUNITY): Payer: Self-pay

## 2017-06-29 ENCOUNTER — Encounter: Payer: Self-pay | Admitting: *Deleted

## 2017-06-29 ENCOUNTER — Ambulatory Visit (HOSPITAL_COMMUNITY)
Admission: RE | Admit: 2017-06-29 | Discharge: 2017-06-29 | Disposition: A | Discharge status: Home / Self Care | Payer: Medicaid Other | Source: Ambulatory Visit | Attending: Nurse Practitioner | Admitting: Nurse Practitioner

## 2017-06-29 ENCOUNTER — Encounter (HOSPITAL_COMMUNITY): Payer: Self-pay

## 2017-06-29 ENCOUNTER — Other Ambulatory Visit: Payer: Self-pay

## 2017-06-29 DIAGNOSIS — Z3A13 13 weeks gestation of pregnancy: Secondary | ICD-10-CM | POA: Diagnosis not present

## 2017-06-29 DIAGNOSIS — D251 Intramural leiomyoma of uterus: Secondary | ICD-10-CM | POA: Insufficient documentation

## 2017-06-29 DIAGNOSIS — Z87798 Personal history of other (corrected) congenital malformations: Secondary | ICD-10-CM | POA: Insufficient documentation

## 2017-06-29 DIAGNOSIS — Z87738 Personal history of other specified (corrected) congenital malformations of digestive system: Secondary | ICD-10-CM

## 2017-06-29 DIAGNOSIS — O99211 Obesity complicating pregnancy, first trimester: Secondary | ICD-10-CM | POA: Diagnosis not present

## 2017-06-29 DIAGNOSIS — Z369 Encounter for antenatal screening, unspecified: Secondary | ICD-10-CM

## 2017-06-29 DIAGNOSIS — Z87761 Personal history of (corrected) gastroschisis: Secondary | ICD-10-CM

## 2017-06-29 DIAGNOSIS — O3411 Maternal care for benign tumor of corpus uteri, first trimester: Secondary | ICD-10-CM | POA: Insufficient documentation

## 2017-06-29 DIAGNOSIS — Z3682 Encounter for antenatal screening for nuchal translucency: Secondary | ICD-10-CM | POA: Diagnosis not present

## 2017-06-29 DIAGNOSIS — E669 Obesity, unspecified: Secondary | ICD-10-CM | POA: Diagnosis not present

## 2017-06-29 HISTORY — DX: Trichomoniasis, unspecified: A59.9

## 2017-06-29 HISTORY — DX: Depression, unspecified: F32.A

## 2017-06-29 HISTORY — DX: Major depressive disorder, single episode, unspecified: F32.9

## 2017-06-30 ENCOUNTER — Encounter: Payer: Self-pay | Admitting: Obstetrics and Gynecology

## 2017-06-30 ENCOUNTER — Ambulatory Visit (INDEPENDENT_AMBULATORY_CARE_PROVIDER_SITE_OTHER): Payer: Medicaid Other | Admitting: Obstetrics and Gynecology

## 2017-06-30 ENCOUNTER — Encounter: Payer: Self-pay | Admitting: *Deleted

## 2017-06-30 VITALS — BP 122/87 | HR 86 | Wt 248.8 lb

## 2017-06-30 DIAGNOSIS — F1210 Cannabis abuse, uncomplicated: Secondary | ICD-10-CM | POA: Insufficient documentation

## 2017-06-30 DIAGNOSIS — Z8759 Personal history of other complications of pregnancy, childbirth and the puerperium: Secondary | ICD-10-CM | POA: Insufficient documentation

## 2017-06-30 DIAGNOSIS — Z9889 Other specified postprocedural states: Secondary | ICD-10-CM | POA: Insufficient documentation

## 2017-06-30 DIAGNOSIS — O0991 Supervision of high risk pregnancy, unspecified, first trimester: Secondary | ICD-10-CM

## 2017-06-30 DIAGNOSIS — O0990 Supervision of high risk pregnancy, unspecified, unspecified trimester: Secondary | ICD-10-CM | POA: Insufficient documentation

## 2017-06-30 DIAGNOSIS — A599 Trichomoniasis, unspecified: Secondary | ICD-10-CM

## 2017-06-30 DIAGNOSIS — O10019 Pre-existing essential hypertension complicating pregnancy, unspecified trimester: Secondary | ICD-10-CM

## 2017-06-30 DIAGNOSIS — O169 Unspecified maternal hypertension, unspecified trimester: Secondary | ICD-10-CM

## 2017-06-30 HISTORY — DX: Personal history of other complications of pregnancy, childbirth and the puerperium: Z87.59

## 2017-06-30 LAB — POCT URINALYSIS DIP (DEVICE)
Bilirubin Urine: NEGATIVE
Glucose, UA: NEGATIVE mg/dL
HGB URINE DIPSTICK: NEGATIVE
KETONES UR: NEGATIVE mg/dL
Leukocytes, UA: NEGATIVE
NITRITE: NEGATIVE
PH: 5.5 (ref 5.0–8.0)
PROTEIN: NEGATIVE mg/dL
Specific Gravity, Urine: 1.025 (ref 1.005–1.030)
Urobilinogen, UA: 0.2 mg/dL (ref 0.0–1.0)

## 2017-06-30 MED ORDER — PREPLUS 27-1 MG PO TABS: 1.0000 | ORAL_TABLET | Freq: Every day | ORAL | 3 refills | Status: AC

## 2017-06-30 NOTE — Progress Notes (Signed)
Here for new ob appointment- referral from [facility].  Given new patient booklets. Has MyChart. Signed up for BAbys[employer]s app. PMH completed.

## 2017-07-01 ENCOUNTER — Encounter: Payer: Self-pay | Admitting: Obstetrics and Gynecology

## 2017-07-01 NOTE — Progress Notes (Signed)
New OB Note  06/30/2017   Clinic: Center for [facility]  Chief Complaint: NOB  Transfer of Care Patient: Yes, [facility]  History of Present Illness: Jody Palmer is a 27 y.o. [id number] @ 13/5 weeks ([place] 11/9, based on Patient's last menstrual period was 03/26/2017.=13wk u/s).  Preg complicated by has History of shoulder dystocia in prior pregnancy; Supervision of high risk pregnancy, antepartum; Hypertension in pregnancy, antepartum; Marijuana abuse; and History of major abdominal surgery on their problem list.   Any events prior to today's visit: no She has Negative signs or symptoms of nausea/vomiting of pregnancy. She has Negative signs or symptoms of miscarriage or preterm labor  ROS: A 12-point review of systems was performed and negative, except as stated in the above HPI.  OBGYN History: As per HPI. OB History  Gravida Para Term Preterm AB Living  7 1 1   5 1   SAB TAB Ectopic Multiple Live Births  1 4   0 1    # Outcome Date GA Lbr Len/2nd Weight Sex Delivery Anes PTL Lv  7 Current           6 TAB 2018          5 Term 03/21/14 [redacted]w[redacted]d 09:17 / 00:20 7 lb 12.5 oz (3.53 kg) F Vag-Spont EPI  LIV     Birth Comments: none  Induced for Gestational HTN  4 TAB 2016          3 TAB 2008          2 TAB 2008          1 SAB 2007            Any issues with any prior pregnancies: yes, shoulder dystocia Prior children are healthy, doing well, and without any problems or issues: yes History of pap smears: Yes. Last pap smear 2019 and results were NILM   Past Medical History: Past Medical History:  Diagnosis Date  . Asthma   . Depression   . Gastroschisis   . Heart murmur    at birth, no problems since  . History of gestational hypertension 06/30/2017   IOL for [facility] 2016  . Hypertension   . SBO (small bowel obstruction) (HCC)    this was during her first year of life, no problems since  . Trichomonas infection     Past Surgical History: Past Surgical History:  Procedure  Laterality Date  . ABDOMINAL SURGERY    . GASTROSCHISIS CLOSURE  1991  . TONSILLECTOMY      Family History:  Family History  Problem Relation Age of Onset  . Diabetes Father   . Hypertension Father    She denies any history of mental retardation, birth defects or genetic disorders in her or the FOB's history  Social History:  Social History   Socioeconomic History  . Marital status: Single    Spouse name: Not on file  . Number of children: Not on file  . Years of education: Not on file  . Highest education level: Not on file  Occupational History  . Not on file  Social Needs  . Financial resource strain: Not on file  . Food insecurity:    Worry: Not on file    Inability: Not on file  . Transportation needs:    Medical: Not on file    Non-medical: Not on file  Tobacco Use  . Smoking status: Former Smoker    Types: Cigars  . Smokeless tobacco: Never Used  Substance and Sexual Activity  . Alcohol use: Not Currently    Comment: occ  . Drug use: Not Currently    Types: Marijuana    Comment: last use May 2015  . Sexual activity: Yes    Birth control/protection: None  Lifestyle  . Physical activity:    Days per week: Not on file    Minutes per session: Not on file  . Stress: Not on file  Relationships  . Social connections:    Talks on phone: Not on file    Gets together: Not on file    Attends religious service: Not on file    Active member of club or organization: Not on file    Attends meetings of clubs or organizations: Not on file    Relationship status: Not on file  . Intimate partner violence:    Fear of current or ex partner: Not on file    Emotionally abused: Not on file    Physically abused: Not on file    Forced sexual activity: Not on file  Other Topics Concern  . Not on file  Social History Narrative  . Not on file    Allergy: No Known Allergies  Health Maintenance:  Mammogram Up to Date: not applicable  Current Outpatient  Medications: PNV, asa 81, procardia xl 30 qday  Physical Exam:   BP 122/87   Pulse 86   Wt 248 lb 12.8 oz (112.9 kg)   LMP 03/26/2017   BMI 36.74 kg/m  Body mass index is 36.74 kg/m. Contractions: Not present Vag. Bleeding: None. Fundal height: not applicable FHTs: 606T  General appearance: Well nourished, well developed female in no acute distress.   Laboratory: [facility] labs reviewed. FTS analytes drawn yesterday  Imaging:  Normal NT scan on 5/9  Assessment: pt doing well  Plan: 1. Supervision of high risk pregnancy, antepartum Routine care. Anatomy u/s scheduled. Ask about afp nv.  - [facility] AMB BABYSCRIPTS OPT IN - [place] MFM OB DETAIL +14 WK; Future  2. History of gestational hypertension See below - [facility] AMB BABYSCRIPTS OPT IN - [place] MFM OB DETAIL +14 WK; Future  3. Hypertension during pregnancy, antepartum, unspecified hypertension in pregnancy type Pt doing well on the procardia. She states she felt a little odd for the first few days after starting it but feels fine now and no issues with the low dose ASA. D/w her re: AP testing with cHTN and delivery date.  - [facility] AMB BABYSCRIPTS OPT IN - [place] MFM OB DETAIL +14 WK; Future  4. History of shoulder dystocia in prior pregnancy Per 02/2014 delivery note:  Delivery (Presentation: Right Occiput Anterior).   Initial attempt at delivery of anterior shoulder was unsuccessful. McRoberts employed  But still unsuccessful delivery of shoulder. Suprapubic pressure was applied and we were able to grasp the anterior axilla and deliver the anterior shoulder.  Newborn was 3530gm. APGARs 9/9.   Patient states that she doesn't remember any issues with the delivery and the child is doing well with no problems and has no neuro deficits.   I told her that we will be getting serial growth scans anyways due to the [facility]. If the weight is similar or less, I told her to consider trying for a VD, which is what she desires, but if it seems to be  considerably more, that I'd strongly consider a primary c-section.   5. Marijuana abuse +UDS at NOB but none since.   6. Trich Dx at mid April pap  smear. toc needed nv  Problem list reviewed and updated.  Follow up in 3 weeks.  >50% of 25 min visit spent on counseling and coordination of care.     Durene Romans MD Attending Center for [facility])

## 2017-07-06 ENCOUNTER — Encounter: Payer: Self-pay | Admitting: Obstetrics and Gynecology

## 2017-07-06 ENCOUNTER — Other Ambulatory Visit: Payer: Self-pay

## 2017-07-06 DIAGNOSIS — A599 Trichomoniasis, unspecified: Secondary | ICD-10-CM | POA: Insufficient documentation

## 2017-07-19 ENCOUNTER — Encounter: Payer: Self-pay | Admitting: *Deleted

## 2017-07-22 ENCOUNTER — Encounter: Payer: Self-pay | Admitting: Obstetrics and Gynecology

## 2017-07-22 ENCOUNTER — Ambulatory Visit (INDEPENDENT_AMBULATORY_CARE_PROVIDER_SITE_OTHER): Payer: Medicaid Other | Admitting: Obstetrics and Gynecology

## 2017-07-22 ENCOUNTER — Other Ambulatory Visit: Payer: Self-pay

## 2017-07-22 ENCOUNTER — Other Ambulatory Visit (HOSPITAL_COMMUNITY)
Admission: RE | Admit: 2017-07-22 | Discharge: 2017-07-22 | Disposition: A | Discharge status: Home / Self Care | Payer: Medicaid Other | Source: Ambulatory Visit | Attending: Obstetrics and Gynecology | Admitting: Obstetrics and Gynecology

## 2017-07-22 VITALS — BP 136/95 | HR 93 | Wt 253.0 lb

## 2017-07-22 DIAGNOSIS — O10919 Unspecified pre-existing hypertension complicating pregnancy, unspecified trimester: Secondary | ICD-10-CM

## 2017-07-22 DIAGNOSIS — O0990 Supervision of high risk pregnancy, unspecified, unspecified trimester: Secondary | ICD-10-CM

## 2017-07-22 DIAGNOSIS — Z8759 Personal history of other complications of pregnancy, childbirth and the puerperium: Secondary | ICD-10-CM

## 2017-07-22 DIAGNOSIS — A599 Trichomoniasis, unspecified: Secondary | ICD-10-CM | POA: Diagnosis not present

## 2017-07-22 NOTE — Progress Notes (Signed)
Subjective:  Jody Palmer is a 27 y.o. G7P1051 at 88w6dbeing seen today for ongoing prenatal care.  She is currently monitored for the following issues for this high-risk pregnancy and has History of shoulder dystocia in prior pregnancy; Supervision of high risk pregnancy, antepartum; History of gestational hypertension; Marijuana abuse; History of major abdominal surgery; Trichomoniasis; and Chronic hypertension during pregnancy, antepartum on their problem list.  Patient reports no complaints.  Contractions: Not present. Vag. Bleeding: None.  Movement: Absent. Denies leaking of fluid.   The following portions of the patient's history were reviewed and updated as appropriate: allergies, current medications, past family history, past medical history, past social history, past surgical history and problem list. Problem list updated.  Objective:   Vitals:   07/22/17 1118  BP: (!) 136/95  Pulse: 93  Weight: 253 lb (114.8 kg)    Fetal Status: Fetal Heart Rate (bpm): 145   Movement: Absent     General:  Alert, oriented and cooperative. Patient is in no acute distress.  Skin: Skin is warm and dry. No rash noted.   Cardiovascular: Normal heart rate noted  Respiratory: Normal respiratory effort, no problems with respiration noted  Abdomen: Soft, gravid, appropriate for gestational age. Pain/Pressure: Present     Pelvic:  Cervical exam deferred        Extremities: Normal range of motion.     Mental Status: Normal mood and affect. Normal behavior. Normal judgment and thought content.   Urinalysis:      Assessment and Plan:  Pregnancy: [id number] 169w6d1. Supervision of high risk pregnancy, antepartum Stable Anatomy scan next month  2. Chronic hypertension during pregnancy, antepartum Stable Continue with BASA and Procardia - Comp Met (CMET) - Protein / creatinine ratio, urine - TSH  3. History of shoulder dystocia in prior pregnancy Will follow growth with serial U/S d/t above  and make decision based on EFW   4. Trichomoniasis TOC today - Cervicovaginal ancillary only  Preterm labor symptoms and general obstetric precautions including but not limited to vaginal bleeding, contractions, leaking of fluid and fetal movement were reviewed in detail with the patient. Please refer to After Visit Summary for other counseling recommendations.  No follow-ups on file.   ErChancy MilroyMD

## 2017-07-22 NOTE — Patient Instructions (Signed)

## 2017-07-23 LAB — COMPREHENSIVE METABOLIC PANEL
A/G RATIO: 1.4 (ref 1.2–2.2)
ALT: 6 IU/L (ref 0–32)
AST: 9 IU/L (ref 0–40)
Albumin: 3.7 g/dL (ref 3.5–5.5)
Alkaline Phosphatase: 49 IU/L (ref 39–117)
BUN/Creatinine Ratio: 16 (ref 9–23)
BUN: 9 mg/dL (ref 6–20)
Bilirubin Total: <0.2 mg/dL (ref 0.0–1.2)
CALCIUM: 8.8 mg/dL (ref 8.7–10.2)
CO2: 21 mmol/L (ref 20–29)
CREATININE: 0.56 mg/dL — AB (ref 0.57–1.00)
Chloride: 99 mmol/L (ref 96–106)
GFR, EST AFRICAN AMERICAN: 147 mL/min/{1.73_m2} (ref >59)
GFR, EST NON AFRICAN AMERICAN: 127 mL/min/{1.73_m2} (ref >59)
Globulin, Total: 2.7 g/dL (ref 1.5–4.5)
Glucose: 78 mg/dL (ref 65–99)
Potassium: 3.6 mmol/L (ref 3.5–5.2)
Sodium: 135 mmol/L (ref 134–144)
TOTAL PROTEIN: 6.4 g/dL (ref 6.0–8.5)

## 2017-07-23 LAB — PROTEIN / CREATININE RATIO, URINE
Creatinine, Urine: 222.8 mg/dL
PROTEIN UR: 21.3 mg/dL
Protein/Creat Ratio: 96 mg/g creat (ref 0–200)

## 2017-07-23 LAB — TSH: TSH: 1.51 u[IU]/mL (ref 0.450–4.500)

## 2017-07-25 LAB — CERVICOVAGINAL ANCILLARY ONLY: Trichomonas: NEGATIVE

## 2017-08-08 ENCOUNTER — Ambulatory Visit (HOSPITAL_COMMUNITY)
Admission: RE | Admit: 2017-08-08 | Discharge: 2017-08-08 | Disposition: A | Discharge status: Home / Self Care | Payer: Medicaid Other | Source: Ambulatory Visit | Attending: Obstetrics and Gynecology | Admitting: Obstetrics and Gynecology

## 2017-08-08 DIAGNOSIS — O0992 Supervision of high risk pregnancy, unspecified, second trimester: Secondary | ICD-10-CM | POA: Diagnosis not present

## 2017-08-08 DIAGNOSIS — O0990 Supervision of high risk pregnancy, unspecified, unspecified trimester: Secondary | ICD-10-CM

## 2017-08-08 DIAGNOSIS — Z3A19 19 weeks gestation of pregnancy: Secondary | ICD-10-CM | POA: Insufficient documentation

## 2017-08-08 DIAGNOSIS — O10012 Pre-existing essential hypertension complicating pregnancy, second trimester: Secondary | ICD-10-CM | POA: Insufficient documentation

## 2017-08-08 DIAGNOSIS — O169 Unspecified maternal hypertension, unspecified trimester: Secondary | ICD-10-CM

## 2017-08-08 DIAGNOSIS — Z8759 Personal history of other complications of pregnancy, childbirth and the puerperium: Secondary | ICD-10-CM | POA: Diagnosis not present

## 2017-08-19 ENCOUNTER — Other Ambulatory Visit: Payer: Self-pay

## 2017-08-19 ENCOUNTER — Ambulatory Visit (INDEPENDENT_AMBULATORY_CARE_PROVIDER_SITE_OTHER): Payer: Medicaid Other | Admitting: Nurse Practitioner

## 2017-08-19 VITALS — BP 137/86 | HR 81 | Wt 261.0 lb

## 2017-08-19 DIAGNOSIS — E669 Obesity, unspecified: Secondary | ICD-10-CM

## 2017-08-19 DIAGNOSIS — O10919 Unspecified pre-existing hypertension complicating pregnancy, unspecified trimester: Secondary | ICD-10-CM

## 2017-08-19 DIAGNOSIS — O0990 Supervision of high risk pregnancy, unspecified, unspecified trimester: Secondary | ICD-10-CM

## 2017-08-19 DIAGNOSIS — Z8759 Personal history of other complications of pregnancy, childbirth and the puerperium: Secondary | ICD-10-CM

## 2017-08-19 DIAGNOSIS — A599 Trichomoniasis, unspecified: Secondary | ICD-10-CM

## 2017-08-19 MED ORDER — RANITIDINE HCL 150 MG PO TABS: 150.0000 mg | ORAL_TABLET | Freq: Two times a day (BID) | ORAL | 1 refills | Status: DC

## 2017-08-19 NOTE — Progress Notes (Signed)
    Subjective:  Jody Palmer is a 27 y.o. G7P1051 at [redacted]w[redacted]d being seen today for ongoing prenatal care.  She is currently monitored for the following issues for this high-risk pregnancy and has History of shoulder dystocia in prior pregnancy; Supervision of high risk pregnancy, antepartum; History of gestational hypertension; Marijuana abuse; History of major abdominal surgery; Trichomoniasis; and Chronic hypertension during pregnancy, antepartum on their problem list.  Patient reports headache and heartburn.  Contractions: Not present. Vag. Bleeding: None.  Movement: Present. Denies leaking of fluid.   The following portions of the patient's history were reviewed and updated as appropriate: allergies, current medications, past family history, past medical history, past social history, past surgical history and problem list. Problem list updated.  Objective:   Vitals:   08/19/17 1142  BP: 137/86  Pulse: 81  Weight: 261 lb (118.4 kg)    Fetal Status: Fetal Heart Rate (bpm): 156 Fundal Height: 29 cm Movement: Present     General:  Alert, oriented and cooperative. Patient is in no acute distress.  Skin: Skin is warm and dry. No rash noted.   Cardiovascular: Normal heart rate noted  Respiratory: Normal respiratory effort, no problems with respiration noted  Abdomen: Soft, gravid, appropriate for gestational age. Pain/Pressure: Present     Pelvic:  Cervical exam deferred        Extremities: Normal range of motion.  Edema: None  Mental Status: Normal mood and affect. Normal behavior. Normal judgment and thought content.   Urinalysis:      Assessment and Plan:  Pregnancy: [id number] at [redacted]w[redacted]d   1. Supervision of high risk pregnancy, antepartum Measures size greater than dates but recent US reviewed Discussed current weight gain (gain of 13 pounds at 20 weeks) and advised to consider smaller portions.  Client interested in more exercise - will try gentle workout at the gym - walking on  treadmill and being in the pool  2. History of gestational hypertension Continues aspirin and procardia - checks BP at work also and diastolic is usually in the 70s.  3. Chronic hypertension during pregnancy, antepartum   Preterm labor symptoms and general obstetric precautions including but not limited to vaginal bleeding, contractions, leaking of fluid and fetal movement were reviewed in detail with the patient. Please refer to After Visit Summary for other counseling recommendations.  No follow-ups on file.  Earlie Server, RN, MSN, NP-BC Nurse Practitioner, [facility] for [employer], [place] [facility] 08/19/2017 12:08 PM

## 2017-08-19 NOTE — Patient Instructions (Signed)

## 2017-08-19 NOTE — Progress Notes (Signed)
ob

## 2017-09-05 ENCOUNTER — Other Ambulatory Visit: Payer: Self-pay | Admitting: Nurse Practitioner

## 2017-09-05 ENCOUNTER — Ambulatory Visit (HOSPITAL_COMMUNITY)
Admission: RE | Admit: 2017-09-05 | Discharge: 2017-09-05 | Disposition: A | Discharge status: Home / Self Care | Payer: Medicaid Other | Source: Ambulatory Visit | Attending: Nurse Practitioner | Admitting: Nurse Practitioner

## 2017-09-05 ENCOUNTER — Encounter (HOSPITAL_COMMUNITY): Payer: Self-pay

## 2017-09-05 DIAGNOSIS — O09293 Supervision of pregnancy with other poor reproductive or obstetric history, third trimester: Secondary | ICD-10-CM | POA: Diagnosis not present

## 2017-09-05 DIAGNOSIS — Z8759 Personal history of other complications of pregnancy, childbirth and the puerperium: Secondary | ICD-10-CM

## 2017-09-05 DIAGNOSIS — Z3A23 23 weeks gestation of pregnancy: Secondary | ICD-10-CM

## 2017-09-05 DIAGNOSIS — O10012 Pre-existing essential hypertension complicating pregnancy, second trimester: Secondary | ICD-10-CM | POA: Insufficient documentation

## 2017-09-05 DIAGNOSIS — O99212 Obesity complicating pregnancy, second trimester: Secondary | ICD-10-CM | POA: Diagnosis not present

## 2017-09-05 DIAGNOSIS — O10013 Pre-existing essential hypertension complicating pregnancy, third trimester: Secondary | ICD-10-CM | POA: Diagnosis not present

## 2017-09-05 DIAGNOSIS — O0992 Supervision of high risk pregnancy, unspecified, second trimester: Secondary | ICD-10-CM | POA: Insufficient documentation

## 2017-09-05 DIAGNOSIS — Z87798 Personal history of other (corrected) congenital malformations: Secondary | ICD-10-CM | POA: Insufficient documentation

## 2017-09-05 DIAGNOSIS — O10919 Unspecified pre-existing hypertension complicating pregnancy, unspecified trimester: Secondary | ICD-10-CM

## 2017-09-05 DIAGNOSIS — Z362 Encounter for other antenatal screening follow-up: Secondary | ICD-10-CM | POA: Diagnosis not present

## 2017-09-05 DIAGNOSIS — O09292 Supervision of pregnancy with other poor reproductive or obstetric history, second trimester: Secondary | ICD-10-CM | POA: Diagnosis not present

## 2017-09-05 DIAGNOSIS — O0990 Supervision of high risk pregnancy, unspecified, unspecified trimester: Secondary | ICD-10-CM

## 2017-09-05 DIAGNOSIS — O09212 Supervision of pregnancy with history of pre-term labor, second trimester: Secondary | ICD-10-CM

## 2017-09-05 IMAGING — US US MFM OB FOLLOW-UP
1 series · 14 of 28 positions shown · non-contrast
Comparison: none

[Series 1: us mfm ob follow-up · 64 acquisitions, 14 frames shown]
[im 3/64]
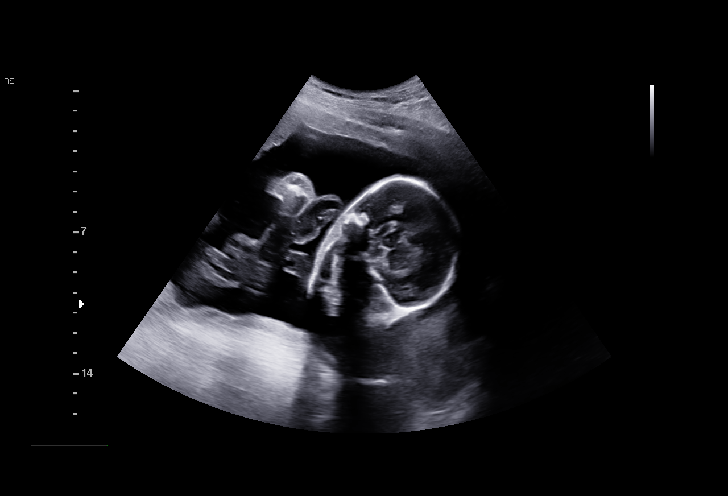
[im 8/64]
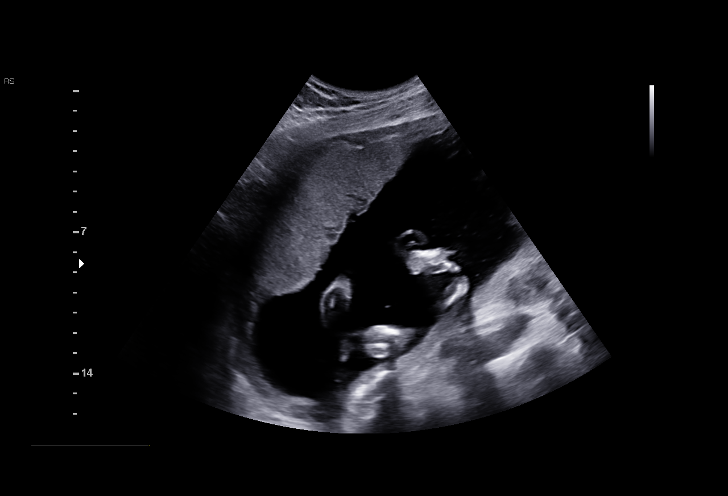
[im 12/64]
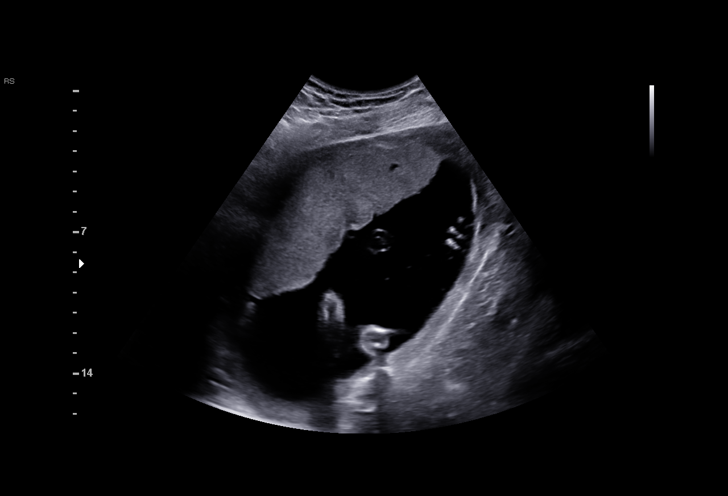
[im 17/64]
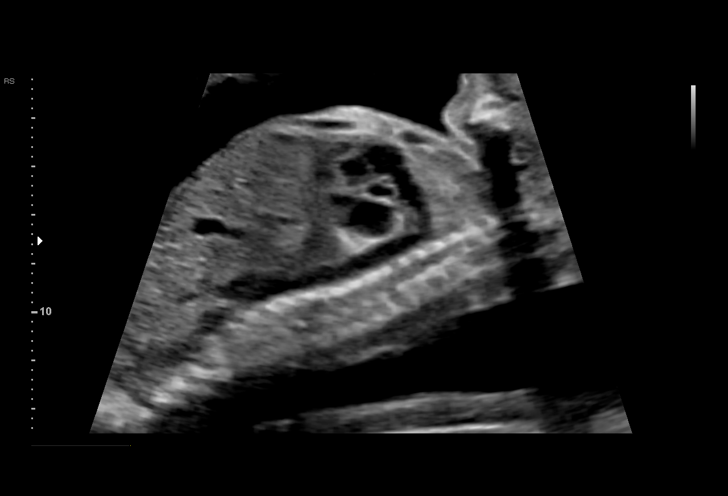
[im 22/64]
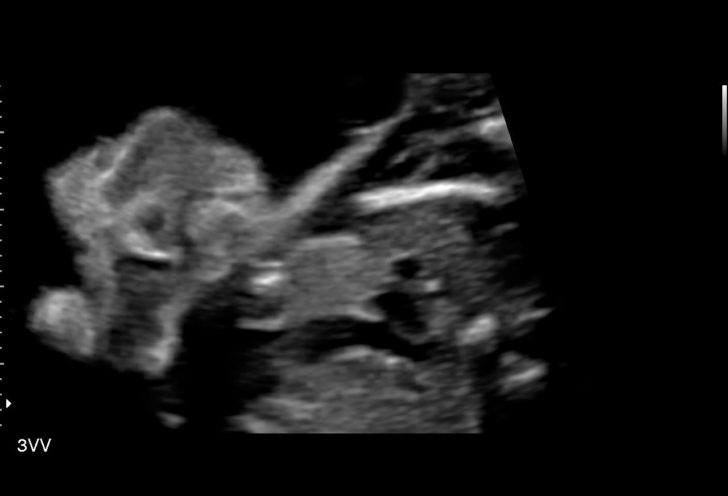
[im 26/64]
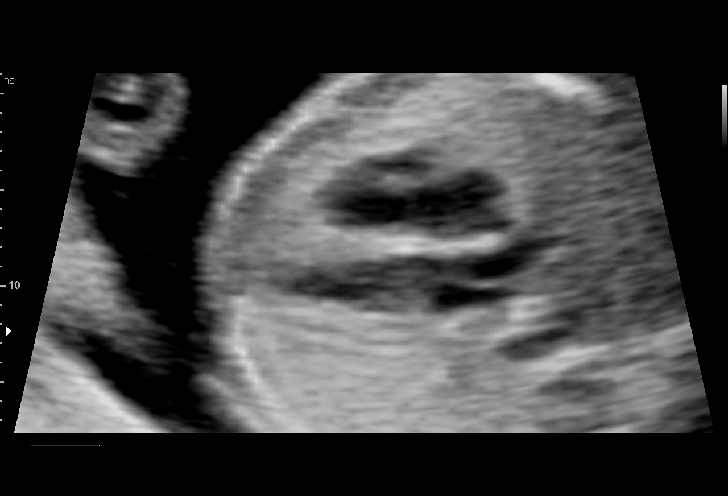
[im 31/64]
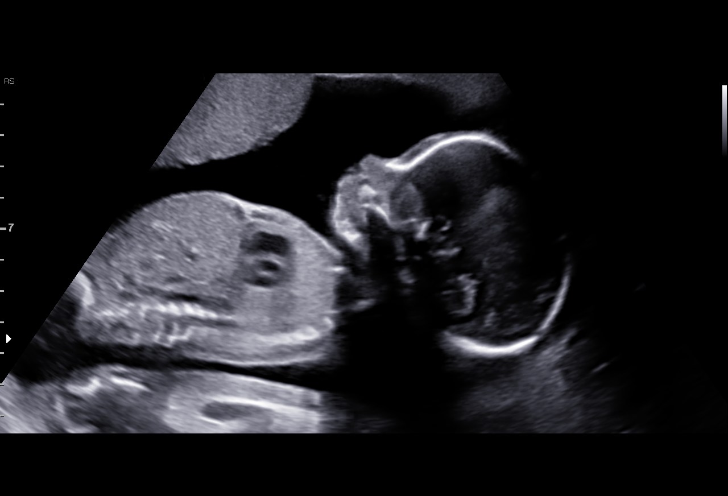
[im 36/64]
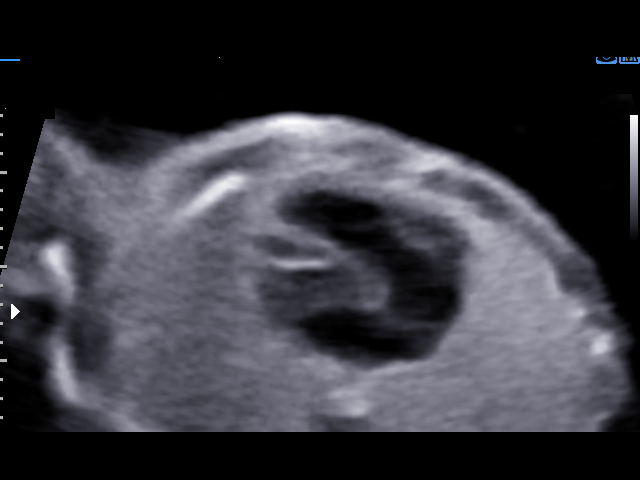
[im 40/64]
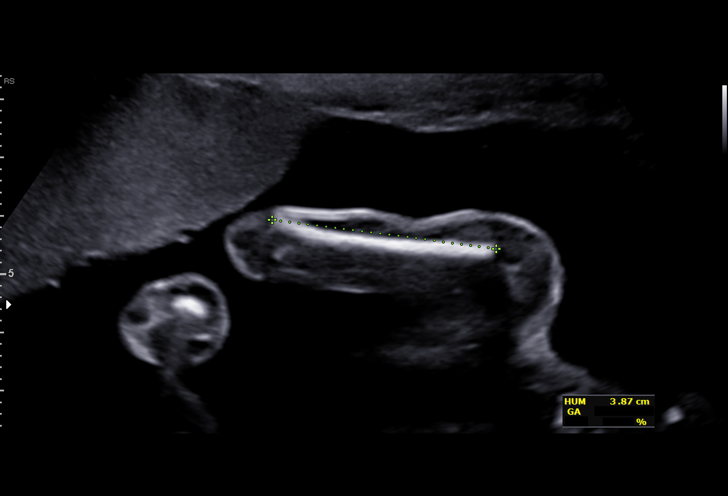
[im 45/64]
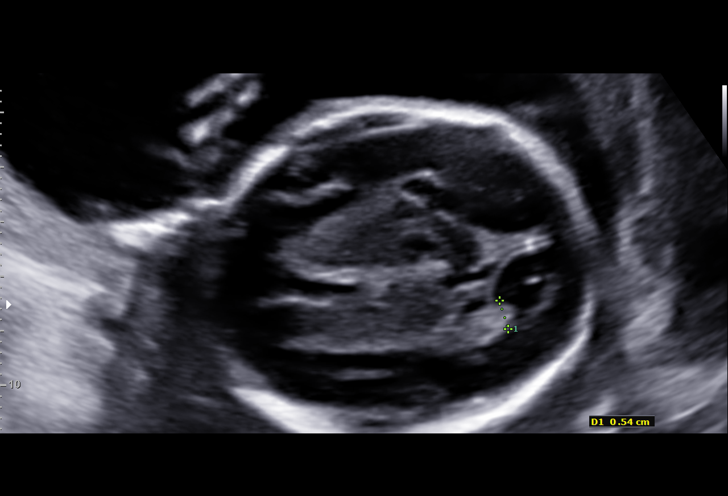
[im 50/64]
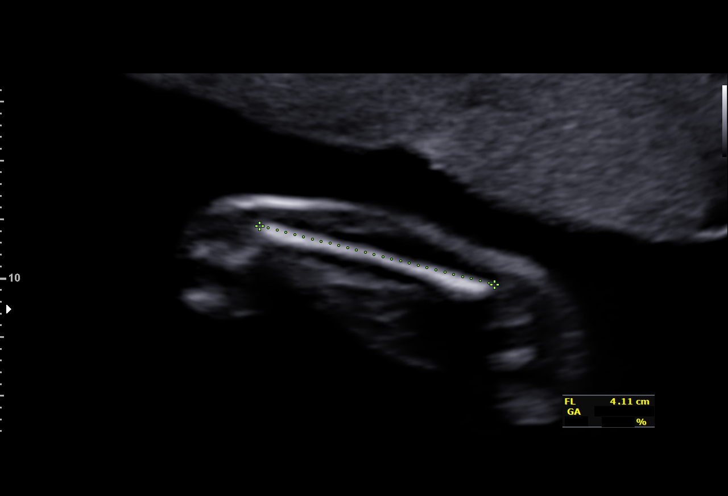
[im 54/64]
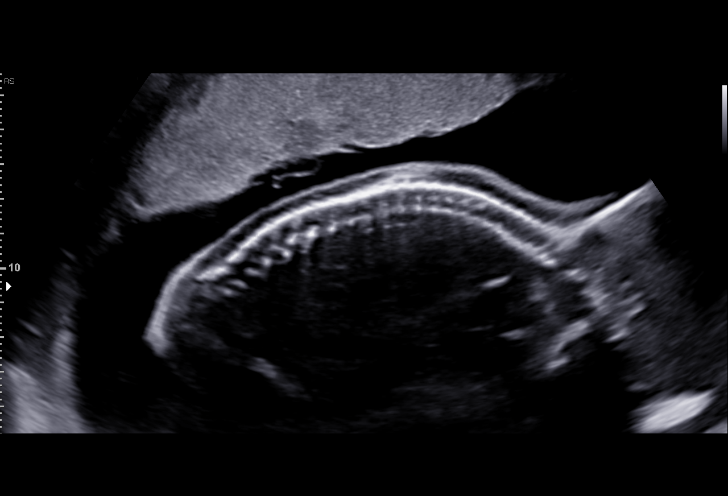
[im 59/64]
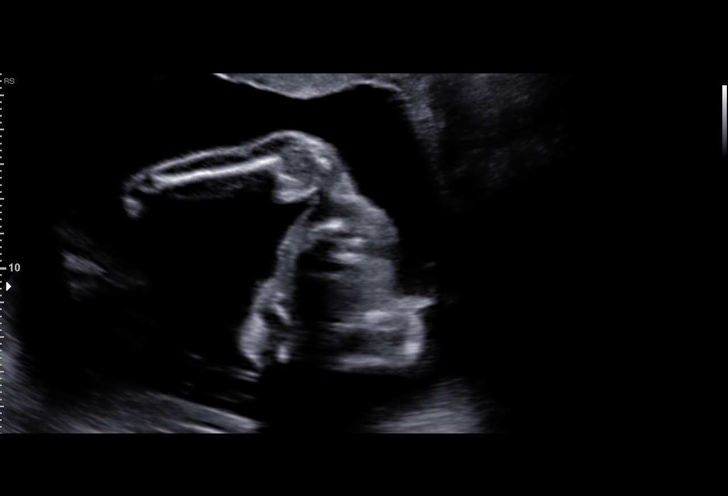
[im 64/64]
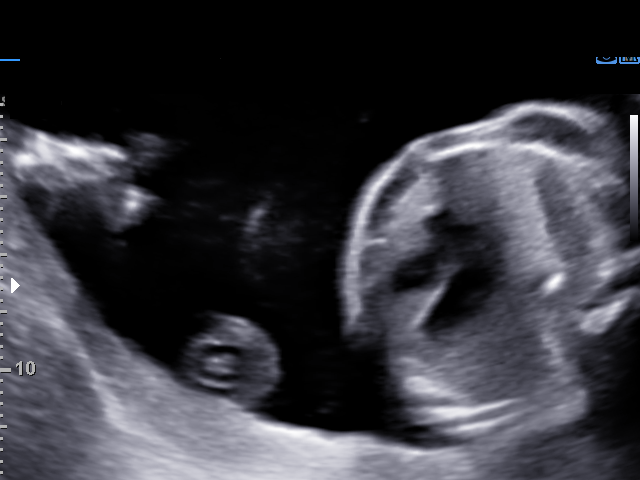

[14 of 28 positions shown; findings below may reference images not displayed]

1  WIENKE           [PHONE_NUMBER]      [PHONE_NUMBER]     [PHONE_NUMBER]
Indications

23 weeks gestation of pregnancy
Hypertension - Chronic/Pre-existing            [67]
Poor obstetric history: Previous               [67]
preeclampsia / eclampsia/gestational HTN
History of congenital or genetic condition     [67]
(Maternal Gastroschisis)
Obesity complicating pregnancy, second         [67]
trimester
Encounter for other antenatal screening        [67]
follow-up
Antenatal follow-up for nonvisualized fetal    [67]
anatomy
OB History

Blood Type:            Height:  5'9"   Weight (lb):  253       BMI:
Gravidity:    7         Term:   1        Prem:   0        SAB:   2
TOP:          3       Ectopic:  0        Living: 1
Fetal Evaluation

Num Of Fetuses:     1
Fetal Heart         144
Rate(bpm):
Cardiac Activity:   Observed
Presentation:       Cephalic
Placenta:           Anterior
P. Cord Insertion:  Previously Visualized

Amniotic Fluid
AFI FV:      Subjectively within normal limits
Largest Pocket(cm)
4.53
Biometry

BPD:      59.6  mm     G. Age:  24w 2d         81  %    CI:        75.14   %    70 - 86
FL/HC:      19.0   %    19.2 -
HC:      218.1  mm     G. Age:  23w 6d         57  %    HC/AC:      1.20        1.05 -
AC:      182.5  mm     G. Age:  23w 0d         35  %    FL/BPD:     69.5   %    71 - 87
FL:       41.4  mm     G. Age:  23w 3d         44  %    FL/AC:      22.7   %    20 - 24
HUM:      38.9  mm     G. Age:  23w 6d         54  %

Est. FW:     586  gm      1 lb 5 oz     54  %
Gestational Age

LMP:           23w 2d        Date:  [DATE]                 EDD:   [DATE]
U/S Today:     23w 5d                                        EDD:   [DATE]
Best:          23w 2d     Det. By:  LMP  ([DATE])          EDD:   [DATE]
Anatomy

Cranium:               Previously seen        Aortic Arch:            Appears normal
Cavum:                 Previously seen        Ductal Arch:            Appears normal
Ventricles:            Previously seen        Diaphragm:              Previously seen
Choroid Plexus:        Previously seen        Stomach:                Appears normal, left
sided
Cerebellum:            Previously seen        Abdomen:                Appears normal
Posterior Fossa:       Previously seen        Abdominal Wall:         Previously seen
Nuchal Fold:           Not applicable (>20    Cord Vessels:           Previously seen
wks GA)
Face:                  Orbits and profile     Kidneys:                Appear normal
previously seen
Lips:                  Previously seen        Bladder:                Appears normal
Thoracic:              Appears normal         Spine:                  Appears normal
Heart:                 Appears normal         Upper Extremities:      Previously seen
(4CH, axis, and
situs)
RVOT:                  Appears normal         Lower Extremities:      Previously seen
LVOT:                  Appears normal

Other:  Female gender. Heels and 5th digit previously visualized.
Cervix Uterus Adnexa

Cervix
Length:           3.87  cm.
Normal appearance by transabdominal scan.

Uterus
No abnormality visualized.

Left Ovary
Within normal limits.

Right Ovary
Within normal limits.

Adnexa:       No abnormality visualized. No adnexal mass
visualized.
Impression

Patient returned for completion of fetal anatomy.
Amniotic fluid is normal and good fetal activity is seen. Fetal
growth is appropriate for gestational age.
Cardiac anatomy and fetal spine appear normal.
Recommendations

Follow-up scans as clinically indicated.

## 2017-09-14 ENCOUNTER — Inpatient Hospital Stay (HOSPITAL_COMMUNITY)
Admission: AD | Admit: 2017-09-14 | Discharge: 2017-09-14 | Disposition: A | Discharge status: Home / Self Care | Payer: Medicaid Other | Source: Ambulatory Visit | Attending: Obstetrics and Gynecology | Admitting: Obstetrics and Gynecology

## 2017-09-14 ENCOUNTER — Other Ambulatory Visit: Payer: Self-pay

## 2017-09-14 ENCOUNTER — Encounter (HOSPITAL_COMMUNITY): Payer: Self-pay | Admitting: *Deleted

## 2017-09-14 DIAGNOSIS — O99342 Other mental disorders complicating pregnancy, second trimester: Secondary | ICD-10-CM | POA: Diagnosis not present

## 2017-09-14 DIAGNOSIS — N898 Other specified noninflammatory disorders of vagina: Secondary | ICD-10-CM | POA: Insufficient documentation

## 2017-09-14 DIAGNOSIS — Z3A24 24 weeks gestation of pregnancy: Secondary | ICD-10-CM | POA: Diagnosis not present

## 2017-09-14 DIAGNOSIS — O26892 Other specified pregnancy related conditions, second trimester: Secondary | ICD-10-CM | POA: Diagnosis not present

## 2017-09-14 DIAGNOSIS — Z87891 Personal history of nicotine dependence: Secondary | ICD-10-CM | POA: Diagnosis not present

## 2017-09-14 DIAGNOSIS — F329 Major depressive disorder, single episode, unspecified: Secondary | ICD-10-CM | POA: Insufficient documentation

## 2017-09-14 DIAGNOSIS — O99512 Diseases of the respiratory system complicating pregnancy, second trimester: Secondary | ICD-10-CM | POA: Diagnosis not present

## 2017-09-14 DIAGNOSIS — J45909 Unspecified asthma, uncomplicated: Secondary | ICD-10-CM | POA: Insufficient documentation

## 2017-09-14 DIAGNOSIS — Z8759 Personal history of other complications of pregnancy, childbirth and the puerperium: Secondary | ICD-10-CM

## 2017-09-14 DIAGNOSIS — O162 Unspecified maternal hypertension, second trimester: Secondary | ICD-10-CM | POA: Insufficient documentation

## 2017-09-14 DIAGNOSIS — O0990 Supervision of high risk pregnancy, unspecified, unspecified trimester: Secondary | ICD-10-CM

## 2017-09-14 LAB — WET PREP, GENITAL
Clue Cells Wet Prep HPF POC: NONE SEEN
Sperm: NONE SEEN
Trich, Wet Prep: NONE SEEN
YEAST WET PREP: NONE SEEN

## 2017-09-14 NOTE — MAU Note (Signed)
Pt. Refused to self swab. Pt was told this was to speed up the process, pt stated she understands and the last time she self swabbed something went undiiagnosed and wanted to be put in a room and tested. Pt was told that the wait time will be long, pt stated she understand and will wait.

## 2017-09-14 NOTE — MAU Provider Note (Signed)
History     CSN: [id number]  Arrival date and time: 09/14/17 1347   First Provider Initiated Contact with Patient 09/14/17 1736      Chief Complaint  Patient presents with  . Vaginal Discharge   HPI [name] is a 27 y.o. [id number] at [redacted]w[redacted]d who presents to MAU today with complaint of vaginal discharge since yesterday. She states it was noted twice since onset and green in color. She denies vaginal bleeding or odor. She denies abdominal pain or contractions. She reports +FM.   OB History    Gravida  7   Para  1   Term  1   Preterm      AB  5   Living  1     SAB  1   TAB  4   Ectopic      Multiple  0   Live Births  1           Past Medical History:  Diagnosis Date  . Asthma   . Depression   . Gastroschisis   . Heart murmur    at birth, no problems since  . History of gestational hypertension 06/30/2017   IOL for [facility] 2016  . Hypertension   . SBO (small bowel obstruction) (HCC)    this was during her first year of life, no problems since  . Trichomonas infection     Past Surgical History:  Procedure Laterality Date  . ABDOMINAL SURGERY    . GASTROSCHISIS CLOSURE  1991  . TONSILLECTOMY      Family History  Problem Relation Age of Onset  . Diabetes Father   . Hypertension Father     Social History   Tobacco Use  . Smoking status: Former Smoker    Types: Cigars  . Smokeless tobacco: Never Used  Substance Use Topics  . Alcohol use: Not Currently    Comment: occ  . Drug use: Not Currently    Types: Marijuana    Comment: last use May 2015    Allergies: No Known Allergies  No medications prior to admission.    Review of Systems  Constitutional: Negative for fever.  Gastrointestinal: Negative for abdominal pain, constipation, diarrhea, nausea and vomiting.  Genitourinary: Positive for vaginal discharge. Negative for dysuria, frequency, urgency and vaginal bleeding.   Physical Exam   Blood pressure (!) 149/83, pulse 85,  temperature 97.7 F (36.5 C), temperature source Oral, resp. rate 16, weight 266 lb 12 oz (121 kg), last menstrual period 03/26/2017, SpO2 100 %.  Physical Exam  Nursing note and vitals reviewed. Constitutional: She is oriented to person, place, and time. She appears well-developed and well-nourished. No distress.  HENT:  Head: Normocephalic and atraumatic.  Cardiovascular: Normal rate.  Respiratory: Effort normal.  GI: Soft. She exhibits no distension and no mass. There is no tenderness. There is no rebound and no guarding.  Genitourinary: Uterus is not enlarged and not tender. Cervix exhibits no motion tenderness, no discharge and no friability. Right adnexum displays no mass and no tenderness. Left adnexum displays no mass and no tenderness. No bleeding in the vagina. Vaginal discharge (moderate thin white discharge noted without odor) found.  Neurological: She is alert and oriented to person, place, and time.  Skin: Skin is warm and dry. No erythema.  Psychiatric: She has a normal mood and affect.   Results for orders placed or performed during the hospital encounter of 09/14/17 (from the past 24 hour(s))  Wet prep, genital  Status: Abnormal   Collection Time: 09/14/17  5:53 PM  Result Value Ref Range   Yeast Wet Prep HPF POC NONE SEEN NONE SEEN   Trich, Wet Prep NONE SEEN NONE SEEN   Clue Cells Wet Prep HPF POC NONE SEEN NONE SEEN   WBC, Wet Prep HPF POC MANY (A) NONE SEEN   Sperm NONE SEEN     Fetal Monitoring: Baseline: 140 bpm Variability: moderate Accelerations: 10 x 10 Decelerations: none Contractions: none Monitoring is technically difficult due to GA and previous abdominal surgery. Able to hear persistent FM at bedside.   MAU Course  Procedures None  MDM Wet prep and GC/Chlamydia today   Assessment and Plan  A: SIUP at [redacted]w[redacted]d Vaginal discharge in pregnancy, second trimester   P:  Discharge home GC/Chlamydia pending  Second trimester precautions  discussed Patient [facility] Patient may return to MAU as needed or if her condition were to change or worsen   Kerry Hough, PA-C 09/14/2017, 8:41 PM

## 2017-09-14 NOTE — MAU Note (Signed)
Had some really abn weird green d/c, first noted last night. Concerned her.

## 2017-09-14 NOTE — Discharge Instructions (Signed)
Second Trimester of Pregnancy The second trimester is from week 13 through week 28, month 4 through 6. This is often the time in pregnancy that you feel your best. Often times, morning sickness has lessened or quit. You may have more energy, and you may get hungry more often. Your unborn baby (fetus) is growing rapidly. At the end of the sixth month, he or she is about 9 inches long and weighs about 1 pounds. You will likely feel the baby move (quickening) between 18 and 20 weeks of pregnancy. Follow these instructions at home:  Avoid all smoking, herbs, and alcohol. Avoid drugs not approved by your doctor.  Do not use any tobacco products, including cigarettes, chewing tobacco, and electronic cigarettes. If you need help quitting, ask your doctor. You may get counseling or other support to help you quit.  Only take medicine as told by your doctor. Some medicines are safe and some are not during pregnancy.  Exercise only as told by your doctor. Stop exercising if you start having cramps.  Eat regular, healthy meals.  Wear a good support bra if your breasts are tender.  Do not use hot tubs, steam rooms, or saunas.  Wear your seat belt when driving.  Avoid raw meat, uncooked cheese, and liter boxes and soil used by cats.  Take your prenatal vitamins.  Take 1500-2000 milligrams of calcium daily starting at the 20th week of pregnancy until you deliver your baby.  Try taking medicine that helps you poop (stool softener) as needed, and if your doctor approves. Eat more fiber by eating fresh fruit, vegetables, and whole grains. Drink enough fluids to keep your pee (urine) clear or pale yellow.  Take warm water baths (sitz baths) to soothe pain or discomfort caused by hemorrhoids. Use hemorrhoid cream if your doctor approves.  If you have puffy, bulging veins (varicose veins), wear support hose. Raise (elevate) your feet for 15 minutes, 3-4 times a day. Limit salt in your diet.  Avoid heavy  lifting, wear low heals, and sit up straight.  Rest with your legs raised if you have leg cramps or low back pain.  Visit your dentist if you have not gone during your pregnancy. Use a soft toothbrush to brush your teeth. Be gentle when you floss.  You can have sex (intercourse) unless your doctor tells you not to.  Go to your doctor visits. Get help if:  You feel dizzy.  You have mild cramps or pressure in your lower belly (abdomen).  You have a nagging pain in your belly area.  You continue to feel sick to your stomach (nauseous), throw up (vomit), or have watery poop (diarrhea).  You have bad smelling fluid coming from your vagina.  You have pain with peeing (urination). Get help right away if:  You have a fever.  You are leaking fluid from your vagina.  You have spotting or bleeding from your vagina.  You have severe belly cramping or pain.  You lose or gain weight rapidly.  You have trouble catching your breath and have chest pain.  You notice sudden or extreme puffiness (swelling) of your face, hands, ankles, feet, or legs.  You have not felt the baby move in over an hour.  You have severe headaches that do not go away with medicine.  You have vision changes. This information is not intended to replace advice given to you by your health care provider. Make sure you discuss any questions you have with your health care   provider. Document Released: 05/05/2009 Document Revised: 07/17/2015 Document Reviewed: 04/11/2012 Elsevier Interactive Patient Education  2017 Elsevier Inc.  

## 2017-09-16 LAB — GC/CHLAMYDIA PROBE AMP (~~LOC~~) NOT AT ARMC
CHLAMYDIA, DNA PROBE: NEGATIVE
Neisseria Gonorrhea: NEGATIVE

## 2017-09-19 ENCOUNTER — Ambulatory Visit (INDEPENDENT_AMBULATORY_CARE_PROVIDER_SITE_OTHER): Payer: Medicaid Other | Admitting: Obstetrics and Gynecology

## 2017-09-19 ENCOUNTER — Encounter: Payer: Self-pay | Admitting: Obstetrics and Gynecology

## 2017-09-19 VITALS — BP 145/87 | HR 101 | Wt 271.6 lb

## 2017-09-19 DIAGNOSIS — O0990 Supervision of high risk pregnancy, unspecified, unspecified trimester: Secondary | ICD-10-CM

## 2017-09-19 DIAGNOSIS — O10919 Unspecified pre-existing hypertension complicating pregnancy, unspecified trimester: Secondary | ICD-10-CM

## 2017-09-19 DIAGNOSIS — Z8759 Personal history of other complications of pregnancy, childbirth and the puerperium: Secondary | ICD-10-CM

## 2017-09-19 LAB — POCT URINALYSIS DIP (DEVICE)
Bilirubin Urine: NEGATIVE
GLUCOSE, UA: NEGATIVE mg/dL
Hgb urine dipstick: NEGATIVE
Nitrite: NEGATIVE
Protein, ur: NEGATIVE mg/dL
Urobilinogen, UA: 0.2 mg/dL (ref 0.0–1.0)
pH: 6 (ref 5.0–8.0)

## 2017-09-19 NOTE — Patient Instructions (Signed)

## 2017-09-19 NOTE — Progress Notes (Signed)
Subjective:  Jody Palmer is a 27 y.o. [id number] at [redacted]w[redacted]d being seen today for ongoing prenatal care.  She is currently monitored for the following issues for this high-risk pregnancy and has History of shoulder dystocia in prior pregnancy; Supervision of high risk pregnancy, antepartum; History of gestational hypertension; Marijuana abuse; History of major abdominal surgery; Chronic hypertension during pregnancy, antepartum; and Obesity (BMI 35.0-39.9 without comorbidity) on their problem list.  Patient reports no complaints.  Contractions: Irregular. Vag. Bleeding: None.  Movement: Present. Denies leaking of fluid.   The following portions of the patient's history were reviewed and updated as appropriate: allergies, current medications, past family history, past medical history, past social history, past surgical history and problem list. Problem list updated.  Objective:   Vitals:   09/19/17 1355 09/19/17 1401  BP: (!) 139/93 (!) 145/87  Pulse: (!) 101   Weight: 271 lb 9.6 oz (123.2 kg)     Fetal Status: Fetal Heart Rate (bpm): 145   Movement: Present     General:  Alert, oriented and cooperative. Patient is in no acute distress.  Skin: Skin is warm and dry. No rash noted.   Cardiovascular: Normal heart rate noted  Respiratory: Normal respiratory effort, no problems with respiration noted  Abdomen: Soft, gravid, appropriate for gestational age. Pain/Pressure: Present     Pelvic:  Cervical exam deferred        Extremities: Normal range of motion.  Edema: None  Mental Status: Normal mood and affect. Normal behavior. Normal judgment and thought content.   Urinalysis: Urine Protein: Negative Urine Glucose: Negative  Assessment and Plan:  Pregnancy: [id number] at [redacted]w[redacted]d  1. Supervision of high risk pregnancy, antepartum Stable Glucola next visit Wt gain reviewed with pt  2. Chronic hypertension during pregnancy, antepartum BP stable Continue with BASA and Procardia - [place] MFM OB  FOLLOW UP; Future  3. History of shoulder dystocia in prior pregnancy Growth scan ordered  Preterm labor symptoms and general obstetric precautions including but not limited to vaginal bleeding, contractions, leaking of fluid and fetal movement were reviewed in detail with the patient. Please refer to After Visit Summary for other counseling recommendations.  Return in about 3 weeks (around 10/10/2017).   Chancy Milroy, MD

## 2017-09-19 NOTE — Progress Notes (Signed)
Discussed excessive weight gain.

## 2017-10-06 ENCOUNTER — Ambulatory Visit (HOSPITAL_COMMUNITY)
Admission: RE | Admit: 2017-10-06 | Discharge: 2017-10-06 | Disposition: A | Discharge status: Home / Self Care | Payer: Medicaid Other | Source: Ambulatory Visit | Attending: Obstetrics and Gynecology | Admitting: Obstetrics and Gynecology

## 2017-10-06 ENCOUNTER — Other Ambulatory Visit (HOSPITAL_COMMUNITY): Payer: Self-pay | Admitting: *Deleted

## 2017-10-06 ENCOUNTER — Encounter (HOSPITAL_COMMUNITY): Payer: Self-pay

## 2017-10-06 DIAGNOSIS — O99212 Obesity complicating pregnancy, second trimester: Secondary | ICD-10-CM | POA: Diagnosis not present

## 2017-10-06 DIAGNOSIS — O10919 Unspecified pre-existing hypertension complicating pregnancy, unspecified trimester: Secondary | ICD-10-CM

## 2017-10-06 DIAGNOSIS — O10012 Pre-existing essential hypertension complicating pregnancy, second trimester: Secondary | ICD-10-CM | POA: Insufficient documentation

## 2017-10-06 DIAGNOSIS — O09292 Supervision of pregnancy with other poor reproductive or obstetric history, second trimester: Secondary | ICD-10-CM | POA: Diagnosis not present

## 2017-10-06 DIAGNOSIS — Z3A27 27 weeks gestation of pregnancy: Secondary | ICD-10-CM | POA: Insufficient documentation

## 2017-10-06 DIAGNOSIS — O0990 Supervision of high risk pregnancy, unspecified, unspecified trimester: Secondary | ICD-10-CM

## 2017-10-06 DIAGNOSIS — O10912 Unspecified pre-existing hypertension complicating pregnancy, second trimester: Secondary | ICD-10-CM

## 2017-10-06 DIAGNOSIS — Z8759 Personal history of other complications of pregnancy, childbirth and the puerperium: Secondary | ICD-10-CM

## 2017-10-10 ENCOUNTER — Other Ambulatory Visit: Payer: Self-pay

## 2017-10-10 DIAGNOSIS — O0990 Supervision of high risk pregnancy, unspecified, unspecified trimester: Secondary | ICD-10-CM

## 2017-10-11 ENCOUNTER — Encounter: Payer: Self-pay | Admitting: Obstetrics and Gynecology

## 2017-10-11 ENCOUNTER — Other Ambulatory Visit: Payer: Medicaid Other

## 2017-10-11 ENCOUNTER — Ambulatory Visit (INDEPENDENT_AMBULATORY_CARE_PROVIDER_SITE_OTHER): Payer: Medicaid Other | Admitting: Obstetrics and Gynecology

## 2017-10-11 VITALS — BP 136/86 | HR 86 | Wt 262.3 lb

## 2017-10-11 DIAGNOSIS — Z23 Encounter for immunization: Secondary | ICD-10-CM | POA: Diagnosis not present

## 2017-10-11 DIAGNOSIS — O0990 Supervision of high risk pregnancy, unspecified, unspecified trimester: Secondary | ICD-10-CM

## 2017-10-11 DIAGNOSIS — O10919 Unspecified pre-existing hypertension complicating pregnancy, unspecified trimester: Secondary | ICD-10-CM

## 2017-10-11 DIAGNOSIS — O0993 Supervision of high risk pregnancy, unspecified, third trimester: Secondary | ICD-10-CM | POA: Diagnosis present

## 2017-10-11 DIAGNOSIS — Z8759 Personal history of other complications of pregnancy, childbirth and the puerperium: Secondary | ICD-10-CM | POA: Diagnosis not present

## 2017-10-11 DIAGNOSIS — O10913 Unspecified pre-existing hypertension complicating pregnancy, third trimester: Secondary | ICD-10-CM

## 2017-10-11 DIAGNOSIS — F1210 Cannabis abuse, uncomplicated: Secondary | ICD-10-CM

## 2017-10-11 NOTE — Patient Instructions (Signed)
Contraception Choices Contraception, also called birth control, refers to methods or devices that prevent pregnancy. Hormonal methods Contraceptive implant A contraceptive implant is a thin, plastic tube that contains a hormone. It is inserted into the upper part of the arm. It can remain in place for up to 3 years. Progestin-only injections Progestin-only injections are injections of progestin, a synthetic form of the hormone progesterone. They are given every 3 months by a health care provider. Birth control pills Birth control pills are pills that contain hormones that prevent pregnancy. They must be taken once a day, preferably at the same time each day. Birth control patch The birth control patch contains hormones that prevent pregnancy. It is placed on the skin and must be changed once a week for three weeks and removed on the fourth week. A prescription is needed to use this method of contraception. Vaginal ring A vaginal ring contains hormones that prevent pregnancy. It is placed in the vagina for three weeks and removed on the fourth week. After that, the process is repeated with a new ring. A prescription is needed to use this method of contraception. Emergency contraceptive Emergency contraceptives prevent pregnancy after unprotected sex. They come in pill form and can be taken up to 5 days after sex. They work best the sooner they are taken after having sex. Most emergency contraceptives are available without a prescription. This method should not be used as your only form of birth control. Barrier methods Female condom A female condom is a thin sheath that is worn over the penis during sex. Condoms keep sperm from going inside a woman's body. They can be used with a spermicide to increase their effectiveness. They should be disposed after a single use. Female condom A female condom is a soft, loose-fitting sheath that is put into the vagina before sex. The condom keeps sperm from going  inside a woman's body. They should be disposed after a single use.  Intrauterine contraception Intrauterine device (IUD) An IUD is a T-shaped device that is put in a woman's uterus. There are two types:  Hormone IUD.This type contains progestin, a synthetic form of the hormone progesterone. This type can stay in place for 3-5 years.  Copper IUD.This type is wrapped in copper wire. It can stay in place for 10 years.  Permanent methods of contraception Female tubal ligation In this method, a woman's fallopian tubes are sealed, tied, or blocked during surgery to prevent eggs from traveling to the uterus.  Female sterilization This is a procedure to tie off the tubes that carry sperm (vasectomy). After the procedure, the man can still ejaculate fluid (semen).  Summary  Contraception, also called birth control, means methods or devices that prevent pregnancy.  Hormonal methods of contraception include implants, injections, pills, patches, vaginal rings, and emergency contraceptives.  Barrier methods of contraception can include female condoms, female condoms, diaphragms, cervical caps, sponges, and spermicides.  There are two types of IUDs (intrauterine devices). An IUD can be put in a woman's uterus to prevent pregnancy for 3-5 years.  Permanent sterilization can be done through a procedure for males, females, or both. This information is not intended to replace advice given to you by your health care provider. Make sure you discuss any questions you have with your health care provider. Document Released: 02/08/2005 Document Revised: 03/13/2016 Document Reviewed: 03/13/2016 Elsevier Interactive Patient Education  2018 Elsevier Inc.  

## 2017-10-11 NOTE — Addendum Note (Signed)
Addended by: Michel Harrow on: 10/11/2017 09:33 AM   Modules accepted: Orders

## 2017-10-11 NOTE — Progress Notes (Signed)
   PRENATAL VISIT NOTE  Subjective:  Jody Palmer is a 27 y.o. [id number] at [redacted]w[redacted]d being seen today for ongoing prenatal care.  She is currently monitored for the following issues for this high-risk pregnancy and has History of shoulder dystocia in prior pregnancy; Supervision of high risk pregnancy, antepartum; History of gestational hypertension; Marijuana abuse; History of major abdominal surgery; Chronic hypertension during pregnancy, antepartum; and Obesity (BMI 35.0-39.9 without comorbidity) on their problem list.  Patient reports occasional cramping.  Contractions: Irritability. Vag. Bleeding: None.  Movement: Present. Denies leaking of fluid.   The following portions of the patient's history were reviewed and updated as appropriate: allergies, current medications, past family history, past medical history, past social history, past surgical history and problem list. Problem list updated.  Objective:   Vitals:   10/11/17 0853  BP: 136/86  Pulse: 86  Weight: 262 lb 4.8 oz (119 kg)    Fetal Status: Fetal Heart Rate (bpm): 139   Movement: Present     General:  Alert, oriented and cooperative. Patient is in no acute distress.  Skin: Skin is warm and dry. No rash noted.   Cardiovascular: Normal heart rate noted  Respiratory: Normal respiratory effort, no problems with respiration noted  Abdomen: Soft, gravid, appropriate for gestational age.  Pain/Pressure: Present     Pelvic: Cervical exam deferred        Extremities: Normal range of motion.  Edema: None  Mental Status: Normal mood and affect. Normal behavior. Normal judgment and thought content.   Assessment and Plan:  Pregnancy: [id number] at [redacted]w[redacted]d  1. Supervision of high risk pregnancy, antepartum 3rd trim labs today Tdap today Reviewed contraception options, she will consider  2. History of gestational hypertension  3. History of shoulder dystocia in prior pregnancy Growth scheduled 9/12  4. Chronic hypertension during  pregnancy, antepartum Cont Procardia 30 mg XL  Cont baby ASA  5. Marijuana abuse   Preterm labor symptoms and general obstetric precautions including but not limited to vaginal bleeding, contractions, leaking of fluid and fetal movement were reviewed in detail with the patient. Please refer to After Visit Summary for other counseling recommendations.  Return in about 2 weeks (around 10/25/2017) for OB visit (MD).  Future Appointments  Date Time Provider [place]  10/11/2017 10:15 AM Sloan Leiter, MD [facility]  11/03/2017 11:00 AM [place] 3 [facility]    Sloan Leiter, MD

## 2017-10-12 LAB — HIV ANTIBODY (ROUTINE TESTING W REFLEX): HIV SCREEN 4TH GENERATION: NONREACTIVE

## 2017-10-12 LAB — CBC
HEMATOCRIT: 35.5 % (ref 34.0–46.6)
HEMOGLOBIN: 12.2 g/dL (ref 11.1–15.9)
MCH: 29.8 pg (ref 26.6–33.0)
MCHC: 34.4 g/dL (ref 31.5–35.7)
MCV: 87 fL (ref 79–97)
Platelets: 268 10*3/uL (ref 150–450)
RBC: 4.09 x10E6/uL (ref 3.77–5.28)
RDW: 14.6 % (ref 12.3–15.4)
WBC: 5 10*3/uL (ref 3.4–10.8)

## 2017-10-12 LAB — GLUCOSE TOLERANCE, 2 HOURS W/ 1HR
Glucose, 1 hour: 69 mg/dL (ref 65–179)
Glucose, 2 hour: 72 mg/dL (ref 65–152)
Glucose, Fasting: 68 mg/dL (ref 65–91)

## 2017-10-12 LAB — RPR: RPR Ser Ql: NONREACTIVE

## 2017-10-27 ENCOUNTER — Ambulatory Visit (INDEPENDENT_AMBULATORY_CARE_PROVIDER_SITE_OTHER): Payer: Medicaid Other | Admitting: Obstetrics and Gynecology

## 2017-10-27 ENCOUNTER — Encounter: Payer: Self-pay | Admitting: Obstetrics and Gynecology

## 2017-10-27 VITALS — BP 137/98 | HR 88 | Wt 264.4 lb

## 2017-10-27 DIAGNOSIS — O0990 Supervision of high risk pregnancy, unspecified, unspecified trimester: Secondary | ICD-10-CM

## 2017-10-27 DIAGNOSIS — O10919 Unspecified pre-existing hypertension complicating pregnancy, unspecified trimester: Secondary | ICD-10-CM

## 2017-10-27 DIAGNOSIS — Z8759 Personal history of other complications of pregnancy, childbirth and the puerperium: Secondary | ICD-10-CM

## 2017-10-27 NOTE — Progress Notes (Signed)
Prenatal Visit Note Date: 10/27/2017 Clinic: [facility]  Subjective:  Jody Palmer is a 27 y.o. [phone] at [redacted]w[redacted]d being seen today for ongoing prenatal care.  She is currently monitored for the following issues for this high-risk pregnancy and has History of shoulder dystocia in prior pregnancy; Supervision of high risk pregnancy, antepartum; History of gestational hypertension; Marijuana abuse; History of major abdominal surgery; Chronic hypertension during pregnancy, antepartum; and Obesity (BMI 35.0-39.9 without comorbidity) on their problem list.  Patient reports occasiona HAs. nothing consistent and no other s/s. Marland Kitchen   Contractions: Irritability. Vag. Bleeding: None.  Movement: Present. Denies leaking of fluid.   The following portions of the patient's history were reviewed and updated as appropriate: allergies, current medications, past family history, past medical history, past social history, past surgical history and problem list. Problem list updated.  Objective:   Vitals:   10/27/17 0923 10/27/17 0924  BP: 131/90 (!) 137/98  Pulse: 87 88  Weight: 264 lb 6.4 oz (119.9 kg)     Fetal Status: Fetal Heart Rate (bpm): 142   Movement: Present     General:  Alert, oriented and cooperative. Patient is in no acute distress.  Skin: Skin is warm and dry. No rash noted.   Cardiovascular: Normal heart rate noted  Respiratory: Normal respiratory effort, no problems with respiration noted  Abdomen: Soft, gravid, appropriate for gestational age. Pain/Pressure: Present     Pelvic:  Cervical exam deferred        Extremities: Normal range of motion.  Edema: None  Mental Status: Normal mood and affect. Normal behavior. Normal judgment and thought content.   Urinalysis:      Assessment and Plan:  Pregnancy: [id number] at [redacted]w[redacted]d  1. Chronic hypertension during pregnancy, antepartum Doing well on low dose asa and procardia 30 xl. Growth next week and start ap testing at  32wks  2. Supervision of high risk pregnancy, antepartum Unsure on BC. Wants more children in the future. Briefly d/w her non estrogen containing options. Normal 28wk labs  3. History of shoulder dystocia in prior pregnancy See PL  Preterm labor symptoms and general obstetric precautions including but not limited to vaginal bleeding, contractions, leaking of fluid and fetal movement were reviewed in detail with the patient. Please refer to After Visit Summary for other counseling recommendations.  Return in about 10 days (around 11/06/2017) for nst only. 2wks nst/bpp/hrob.   Aletha Halim, MD

## 2017-10-27 NOTE — Progress Notes (Signed)
Pt having headaches every so often, no other symptoms, has been taking Tylenol and it goes away. Pt not very concerned with Headaches, boyfriend wanted her to mention it.

## 2017-11-01 DIAGNOSIS — Z0389 Encounter for observation for other suspected diseases and conditions ruled out: Secondary | ICD-10-CM | POA: Diagnosis not present

## 2017-11-01 DIAGNOSIS — Z3009 Encounter for other general counseling and advice on contraception: Secondary | ICD-10-CM | POA: Diagnosis not present

## 2017-11-01 DIAGNOSIS — Z1388 Encounter for screening for disorder due to exposure to contaminants: Secondary | ICD-10-CM | POA: Diagnosis not present

## 2017-11-03 ENCOUNTER — Ambulatory Visit (HOSPITAL_COMMUNITY)
Admission: RE | Admit: 2017-11-03 | Discharge: 2017-11-03 | Disposition: A | Discharge status: Home / Self Care | Payer: Medicaid Other | Source: Ambulatory Visit | Attending: Obstetrics and Gynecology | Admitting: Obstetrics and Gynecology

## 2017-11-03 ENCOUNTER — Encounter (HOSPITAL_COMMUNITY): Payer: Self-pay

## 2017-11-03 DIAGNOSIS — Z3A31 31 weeks gestation of pregnancy: Secondary | ICD-10-CM | POA: Diagnosis not present

## 2017-11-03 DIAGNOSIS — O99213 Obesity complicating pregnancy, third trimester: Secondary | ICD-10-CM

## 2017-11-03 DIAGNOSIS — O163 Unspecified maternal hypertension, third trimester: Secondary | ICD-10-CM | POA: Diagnosis not present

## 2017-11-03 DIAGNOSIS — O09293 Supervision of pregnancy with other poor reproductive or obstetric history, third trimester: Secondary | ICD-10-CM

## 2017-11-03 DIAGNOSIS — O10919 Unspecified pre-existing hypertension complicating pregnancy, unspecified trimester: Secondary | ICD-10-CM

## 2017-11-07 ENCOUNTER — Ambulatory Visit (INDEPENDENT_AMBULATORY_CARE_PROVIDER_SITE_OTHER): Payer: Medicaid Other | Admitting: *Deleted

## 2017-11-07 ENCOUNTER — Other Ambulatory Visit (HOSPITAL_COMMUNITY): Payer: Self-pay | Admitting: *Deleted

## 2017-11-07 VITALS — BP 125/72 | HR 84 | Wt 266.7 lb

## 2017-11-07 DIAGNOSIS — O10913 Unspecified pre-existing hypertension complicating pregnancy, third trimester: Secondary | ICD-10-CM

## 2017-11-07 DIAGNOSIS — O10919 Unspecified pre-existing hypertension complicating pregnancy, unspecified trimester: Secondary | ICD-10-CM

## 2017-11-09 ENCOUNTER — Encounter (HOSPITAL_COMMUNITY): Payer: Self-pay

## 2017-11-09 ENCOUNTER — Ambulatory Visit (HOSPITAL_COMMUNITY)
Admission: RE | Admit: 2017-11-09 | Discharge: 2017-11-09 | Disposition: A | Discharge status: Home / Self Care | Payer: Medicaid Other | Source: Ambulatory Visit | Attending: Obstetrics and Gynecology | Admitting: Obstetrics and Gynecology

## 2017-11-09 DIAGNOSIS — O10013 Pre-existing essential hypertension complicating pregnancy, third trimester: Secondary | ICD-10-CM | POA: Insufficient documentation

## 2017-11-09 DIAGNOSIS — O99213 Obesity complicating pregnancy, third trimester: Secondary | ICD-10-CM

## 2017-11-09 DIAGNOSIS — Z3A32 32 weeks gestation of pregnancy: Secondary | ICD-10-CM

## 2017-11-09 DIAGNOSIS — O10913 Unspecified pre-existing hypertension complicating pregnancy, third trimester: Secondary | ICD-10-CM

## 2017-11-14 ENCOUNTER — Ambulatory Visit: Payer: Self-pay

## 2017-11-14 ENCOUNTER — Ambulatory Visit (INDEPENDENT_AMBULATORY_CARE_PROVIDER_SITE_OTHER): Payer: Medicaid Other | Admitting: Family Medicine

## 2017-11-14 ENCOUNTER — Ambulatory Visit (INDEPENDENT_AMBULATORY_CARE_PROVIDER_SITE_OTHER): Payer: Medicaid Other | Admitting: *Deleted

## 2017-11-14 VITALS — BP 128/79 | HR 92 | Wt 266.6 lb

## 2017-11-14 DIAGNOSIS — O10919 Unspecified pre-existing hypertension complicating pregnancy, unspecified trimester: Secondary | ICD-10-CM

## 2017-11-14 DIAGNOSIS — O0990 Supervision of high risk pregnancy, unspecified, unspecified trimester: Secondary | ICD-10-CM

## 2017-11-14 DIAGNOSIS — O10913 Unspecified pre-existing hypertension complicating pregnancy, third trimester: Secondary | ICD-10-CM

## 2017-11-14 DIAGNOSIS — Z8759 Personal history of other complications of pregnancy, childbirth and the puerperium: Secondary | ICD-10-CM

## 2017-11-14 NOTE — Progress Notes (Signed)
   PRENATAL VISIT NOTE  Subjective:  Jody Palmer is a 27 y.o. [id number] at [redacted]w[redacted]d being seen today for ongoing prenatal care.  She is currently monitored for the following issues for this high-risk pregnancy and has History of shoulder dystocia in prior pregnancy; Supervision of high risk pregnancy, antepartum; History of gestational hypertension; Marijuana abuse; History of major abdominal surgery; Chronic hypertension during pregnancy, antepartum; and Obesity (BMI 35.0-39.9 without comorbidity) on their problem list.  Patient reports no complaints.  Contractions: Not present. Vag. Bleeding: None.  Movement: Present. Denies leaking of fluid.   The following portions of the patient's history were reviewed and updated as appropriate: allergies, current medications, past family history, past medical history, past social history, past surgical history and problem list. Problem list updated.  Objective:   Vitals:   11/14/17 0942  BP: 128/79  Pulse: 92  Weight: 266 lb 9.6 oz (120.9 kg)    Fetal Status: Fetal Heart Rate (bpm): NST   Movement: Present     General:  Alert, oriented and cooperative. Patient is in no acute distress.  Skin: Skin is warm and dry. No rash noted.   Cardiovascular: Normal heart rate noted  Respiratory: Normal respiratory effort, no problems with respiration noted  Abdomen: Soft, gravid, appropriate for gestational age.  Pain/Pressure: Present     Pelvic: Cervical exam deferred        Extremities: Normal range of motion.  Edema: None  Mental Status: Normal mood and affect. Normal behavior. Normal judgment and thought content.  NST:  Baseline: 125 bpm, Variability: Good {> 6 bpm), Accelerations: Reactive and Decelerations: Absent   Assessment and Plan:  Pregnancy: [id number] at [redacted]w[redacted]d  1. Chronic hypertension during pregnancy, antepartum BP is well controlled Continue Procardia, ASA U/s for growth on 9/12-->EFW 4 lb + 69%  2. History of shoulder dystocia in prior  pregnancy Watch growth, f/u scheduled  3. Supervision of high risk pregnancy, antepartum Continue prenatal care.   Preterm labor symptoms and general obstetric precautions including but not limited to vaginal bleeding, contractions, leaking of fluid and fetal movement were reviewed in detail with the patient. Please refer to After Visit Summary for other counseling recommendations.  Return in 9 days (on 11/23/2017) for NST only - has [place] @ 1000; 10/9  HOB and NST - has [place] @ 1015.  Future Appointments  Date Time Provider [place]  11/22/2017  9:15 AM WOC-WOCA NST WOC-WOCA WOC  11/23/2017 10:00 AM WH-MFC [place] 1 WH-MFCUS MFC-US  11/30/2017  9:15 AM Aletha Halim, MD WOC-WOCA WOC  11/30/2017 10:15 AM WH-MFC [place] 4 WH-MFCUS MFC-US  11/30/2017 11:15 AM WOC-WOCA NST WOC-WOCA WOC    Donnamae Jude, MD

## 2017-11-14 NOTE — Progress Notes (Signed)

## 2017-11-14 NOTE — Patient Instructions (Signed)

## 2017-11-16 ENCOUNTER — Ambulatory Visit (HOSPITAL_COMMUNITY): Payer: Medicaid Other

## 2017-11-22 ENCOUNTER — Ambulatory Visit (INDEPENDENT_AMBULATORY_CARE_PROVIDER_SITE_OTHER): Payer: Medicaid Other | Admitting: *Deleted

## 2017-11-22 VITALS — BP 128/76 | HR 89 | Wt 269.7 lb

## 2017-11-22 DIAGNOSIS — O10913 Unspecified pre-existing hypertension complicating pregnancy, third trimester: Secondary | ICD-10-CM

## 2017-11-22 DIAGNOSIS — O10919 Unspecified pre-existing hypertension complicating pregnancy, unspecified trimester: Secondary | ICD-10-CM

## 2017-11-22 NOTE — Progress Notes (Signed)
BPP @ MFM tomorrow.  [place] for growth and BPP scheduled next week.

## 2017-11-23 ENCOUNTER — Other Ambulatory Visit (HOSPITAL_COMMUNITY): Payer: Self-pay | Admitting: *Deleted

## 2017-11-23 ENCOUNTER — Ambulatory Visit (HOSPITAL_COMMUNITY)
Admission: RE | Admit: 2017-11-23 | Discharge: 2017-11-23 | Disposition: A | Discharge status: Home / Self Care | Payer: Medicaid Other | Source: Ambulatory Visit | Attending: Obstetrics and Gynecology | Admitting: Obstetrics and Gynecology

## 2017-11-23 ENCOUNTER — Encounter (HOSPITAL_COMMUNITY): Payer: Self-pay

## 2017-11-23 DIAGNOSIS — O99213 Obesity complicating pregnancy, third trimester: Secondary | ICD-10-CM

## 2017-11-23 DIAGNOSIS — O10919 Unspecified pre-existing hypertension complicating pregnancy, unspecified trimester: Secondary | ICD-10-CM

## 2017-11-23 DIAGNOSIS — O10913 Unspecified pre-existing hypertension complicating pregnancy, third trimester: Secondary | ICD-10-CM | POA: Insufficient documentation

## 2017-11-23 DIAGNOSIS — O10013 Pre-existing essential hypertension complicating pregnancy, third trimester: Secondary | ICD-10-CM | POA: Diagnosis not present

## 2017-11-23 DIAGNOSIS — O09293 Supervision of pregnancy with other poor reproductive or obstetric history, third trimester: Secondary | ICD-10-CM

## 2017-11-23 DIAGNOSIS — Z3A34 34 weeks gestation of pregnancy: Secondary | ICD-10-CM | POA: Diagnosis not present

## 2017-11-30 ENCOUNTER — Encounter (HOSPITAL_COMMUNITY): Payer: Self-pay

## 2017-11-30 ENCOUNTER — Encounter: Payer: Self-pay | Admitting: Obstetrics and Gynecology

## 2017-11-30 ENCOUNTER — Ambulatory Visit (INDEPENDENT_AMBULATORY_CARE_PROVIDER_SITE_OTHER): Payer: Medicaid Other | Admitting: *Deleted

## 2017-11-30 ENCOUNTER — Ambulatory Visit (HOSPITAL_COMMUNITY)
Admission: RE | Admit: 2017-11-30 | Discharge: 2017-11-30 | Disposition: A | Discharge status: Home / Self Care | Payer: Medicaid Other | Source: Ambulatory Visit | Attending: Obstetrics and Gynecology | Admitting: Obstetrics and Gynecology

## 2017-11-30 ENCOUNTER — Ambulatory Visit (INDEPENDENT_AMBULATORY_CARE_PROVIDER_SITE_OTHER): Payer: Medicaid Other | Admitting: Obstetrics and Gynecology

## 2017-11-30 ENCOUNTER — Other Ambulatory Visit (HOSPITAL_COMMUNITY)
Admission: RE | Admit: 2017-11-30 | Discharge: 2017-11-30 | Disposition: A | Discharge status: Home / Self Care | Payer: Medicaid Other | Source: Ambulatory Visit | Attending: Obstetrics and Gynecology | Admitting: Obstetrics and Gynecology

## 2017-11-30 VITALS — BP 130/85 | HR 92 | Wt 269.9 lb

## 2017-11-30 DIAGNOSIS — O10013 Pre-existing essential hypertension complicating pregnancy, third trimester: Secondary | ICD-10-CM

## 2017-11-30 DIAGNOSIS — Z3A35 35 weeks gestation of pregnancy: Secondary | ICD-10-CM

## 2017-11-30 DIAGNOSIS — O09293 Supervision of pregnancy with other poor reproductive or obstetric history, third trimester: Secondary | ICD-10-CM

## 2017-11-30 DIAGNOSIS — O10919 Unspecified pre-existing hypertension complicating pregnancy, unspecified trimester: Secondary | ICD-10-CM

## 2017-11-30 DIAGNOSIS — N898 Other specified noninflammatory disorders of vagina: Secondary | ICD-10-CM | POA: Insufficient documentation

## 2017-11-30 DIAGNOSIS — O0993 Supervision of high risk pregnancy, unspecified, third trimester: Secondary | ICD-10-CM | POA: Insufficient documentation

## 2017-11-30 DIAGNOSIS — O99213 Obesity complicating pregnancy, third trimester: Secondary | ICD-10-CM

## 2017-11-30 DIAGNOSIS — O0990 Supervision of high risk pregnancy, unspecified, unspecified trimester: Secondary | ICD-10-CM | POA: Diagnosis not present

## 2017-11-30 DIAGNOSIS — O10913 Unspecified pre-existing hypertension complicating pregnancy, third trimester: Secondary | ICD-10-CM

## 2017-11-30 DIAGNOSIS — Z8759 Personal history of other complications of pregnancy, childbirth and the puerperium: Secondary | ICD-10-CM

## 2017-11-30 NOTE — Progress Notes (Signed)
Pt reports vaginal d/c w/odor.  She requests to be tested for infection. She also has a H/A today - pain scale 5. [place] for growth and BPP today @ MFM.

## 2017-11-30 NOTE — Progress Notes (Signed)
Prenatal Visit Note Date: 11/30/2017 Clinic: [facility]  Subjective:  Jody Palmer is a 27 y.o. [id number] at [redacted]w[redacted]d being seen today for ongoing prenatal care.  She is currently monitored for the following issues for this high-risk pregnancy and has History of shoulder dystocia in prior pregnancy; Supervision of high risk pregnancy, antepartum; Marijuana abuse; History of major abdominal surgery; Trichomonosis; Chronic hypertension during pregnancy, antepartum; and Obesity (BMI 35.0-39.9 without comorbidity) on their problem list.  Patient reports HA comes and goes and almost gone now. pt did eat breakfast this morning..   Contractions: Irregular. Vag. Bleeding: None.  Movement: Present. Denies leaking of fluid.   The following portions of the patient's history were reviewed and updated as appropriate: allergies, current medications, past family history, past medical history, past social history, past surgical history and problem list. Problem list updated.  Objective:   Vitals:   11/30/17 0945  BP: 130/85  Pulse: 92  Weight: 269 lb 14.4 oz (122.4 kg)    Fetal Status: Fetal Heart Rate (bpm): NST   Movement: Present     General:  Alert, oriented and cooperative. Patient is in no acute distress.  Skin: Skin is warm and dry. No rash noted.   Cardiovascular: Normal heart rate noted  Respiratory: Normal respiratory effort, no problems with respiration noted  Abdomen: Soft, gravid, appropriate for gestational age. Pain/Pressure: Present     Pelvic:  Cervical exam deferred        Extremities: Normal range of motion.  Edema: None  Mental Status: Normal mood and affect. Normal behavior. Normal judgment and thought content.   Urinalysis:      Assessment and Plan:  Pregnancy: [id number] at [redacted]w[redacted]d  1. Supervision of high risk pregnancy, antepartum Routine care. Unsure of BC ?withdrawal vs condoms - Cervicovaginal ancillary only - Strep Gp B NAA  2. Chronic hypertension  during pregnancy, antepartum Doing well on procardia 30 qday. RNST. Growth u/s and bpp later today  3. Vaginal odor - Cervicovaginal ancillary only  4. History of shoulder dystocia in prior pregnancy D/w her more after u/s today  Preterm labor symptoms and general obstetric precautions including but not limited to vaginal bleeding, contractions, leaking of fluid and fetal movement were reviewed in detail with the patient. Please refer to After Visit Summary for other counseling recommendations.  Return in about 1 week (around 12/07/2017) for NST only and HOB - has [place] @ 7680; 10/23 NST only and HOB - has [place] @ 1000.   Aletha Halim, MD

## 2017-12-01 LAB — CERVICOVAGINAL ANCILLARY ONLY
BACTERIAL VAGINITIS: POSITIVE — AB
CHLAMYDIA, DNA PROBE: NEGATIVE
Candida vaginitis: POSITIVE — AB
NEISSERIA GONORRHEA: NEGATIVE
Trichomonas: NEGATIVE

## 2017-12-02 LAB — STREP GP B NAA: Strep Gp B NAA: NEGATIVE

## 2017-12-05 MED ORDER — MICONAZOLE NITRATE 2 % VA CREA: 1.0000 | TOPICAL_CREAM | Freq: Every day | VAGINAL | 2 refills | Status: DC

## 2017-12-05 MED ORDER — METRONIDAZOLE 500 MG PO TABS: 500.0000 mg | ORAL_TABLET | Freq: Two times a day (BID) | ORAL | 0 refills | Status: AC

## 2017-12-05 NOTE — Addendum Note (Signed)
Addended by: Aletha Halim on: 12/05/2017 09:37 AM   Modules accepted: Orders

## 2017-12-07 ENCOUNTER — Ambulatory Visit (HOSPITAL_COMMUNITY)
Admission: RE | Admit: 2017-12-07 | Discharge: 2017-12-07 | Disposition: A | Discharge status: Home / Self Care | Payer: Medicaid Other | Source: Ambulatory Visit | Attending: Obstetrics and Gynecology | Admitting: Obstetrics and Gynecology

## 2017-12-07 ENCOUNTER — Encounter (HOSPITAL_COMMUNITY): Payer: Self-pay

## 2017-12-07 DIAGNOSIS — O99213 Obesity complicating pregnancy, third trimester: Secondary | ICD-10-CM

## 2017-12-07 DIAGNOSIS — Z3A36 36 weeks gestation of pregnancy: Secondary | ICD-10-CM | POA: Insufficient documentation

## 2017-12-07 DIAGNOSIS — O10913 Unspecified pre-existing hypertension complicating pregnancy, third trimester: Secondary | ICD-10-CM | POA: Insufficient documentation

## 2017-12-07 DIAGNOSIS — O09293 Supervision of pregnancy with other poor reproductive or obstetric history, third trimester: Secondary | ICD-10-CM

## 2017-12-07 DIAGNOSIS — O10013 Pre-existing essential hypertension complicating pregnancy, third trimester: Secondary | ICD-10-CM

## 2017-12-09 ENCOUNTER — Ambulatory Visit: Payer: Medicaid Other | Admitting: *Deleted

## 2017-12-09 ENCOUNTER — Ambulatory Visit (INDEPENDENT_AMBULATORY_CARE_PROVIDER_SITE_OTHER): Payer: Medicaid Other | Admitting: Obstetrics & Gynecology

## 2017-12-09 ENCOUNTER — Encounter: Payer: Self-pay | Admitting: Obstetrics & Gynecology

## 2017-12-09 VITALS — BP 135/86 | HR 88 | Wt 274.9 lb

## 2017-12-09 DIAGNOSIS — O0990 Supervision of high risk pregnancy, unspecified, unspecified trimester: Secondary | ICD-10-CM

## 2017-12-09 DIAGNOSIS — O10919 Unspecified pre-existing hypertension complicating pregnancy, unspecified trimester: Secondary | ICD-10-CM

## 2017-12-09 DIAGNOSIS — Z8759 Personal history of other complications of pregnancy, childbirth and the puerperium: Secondary | ICD-10-CM

## 2017-12-09 DIAGNOSIS — E669 Obesity, unspecified: Secondary | ICD-10-CM

## 2017-12-09 DIAGNOSIS — F1210 Cannabis abuse, uncomplicated: Secondary | ICD-10-CM

## 2017-12-09 DIAGNOSIS — Z9889 Other specified postprocedural states: Secondary | ICD-10-CM

## 2017-12-09 NOTE — Progress Notes (Signed)
   PRENATAL VISIT NOTE  Subjective:  Jody Palmer is a 27 y.o. G7P1051 at [redacted]w[redacted]d being seen today for ongoing prenatal care.  She is currently monitored for the following issues for this high-risk pregnancy and has History of shoulder dystocia in prior pregnancy; Supervision of high risk pregnancy, antepartum; Marijuana abuse; History of major abdominal surgery; Trichomonosis; Chronic hypertension during pregnancy, antepartum; and Obesity (BMI 35.0-39.9 without comorbidity) on their problem list.  Patient reports no complaints.  Contractions: Irregular. Vag. Bleeding: None.  Movement: Present. Denies leaking of fluid.   The following portions of the patient's history were reviewed and updated as appropriate: allergies, current medications, past family history, past medical history, past social history, past surgical history and problem list. Problem list updated.  Objective:   Vitals:   12/09/17 0831  BP: 135/86  Pulse: 88  Weight: 274 lb 14.4 oz (124.7 kg)    Fetal Status: Fetal Heart Rate (bpm): NST   Movement: Present     General:  Alert, oriented and cooperative. Patient is in no acute distress.  Skin: Skin is warm and dry. No rash noted.   Cardiovascular: Normal heart rate noted  Respiratory: Normal respiratory effort, no problems with respiration noted  Abdomen: Soft, gravid, appropriate for gestational age.  Pain/Pressure: Present     Pelvic: Cervical exam deferred        Extremities: Normal range of motion.     Mental Status: Normal mood and affect. Normal behavior. Normal judgment and thought content.   Assessment and Plan:  Pregnancy: H6O3729 at [redacted]w[redacted]d  1. Supervision of high risk pregnancy, antepartum  2. Obesity (BMI 35.0-39.9 without comorbidity)  3. Chronic hypertension during pregnancy, antepartum NST reviewed and reactive.  4. History of major abdominal surgery  5. History of shoulder dystocia in prior pregnancy 11/30/2017 Est. FW:    3209  gm      7 lb 1  oz   > 90  %  Has new [place] scheduled for next week.   6. Marijuana abuse  Preterm labor symptoms and general obstetric precautions including but not limited to vaginal bleeding, contractions, leaking of fluid and fetal movement were reviewed in detail with the patient. Please refer to After Visit Summary for other counseling recommendations.  Return in about 5 days (around 12/14/2017) for as scheduled.  Future Appointments  Date Time Provider [place]  12/09/2017  9:15 AM Lavonia Drafts, MD [facility] [place]  12/14/2017  8:15 AM WOC-WOCA NST WOC-WOCA WOC  12/14/2017  9:15 AM Donnamae Jude, MD WOC-WOCA WOC  12/14/2017 10:00 AM WH-MFC [place] 3 WH-MFCUS MFC-US    Lavonia Drafts, MD

## 2017-12-09 NOTE — Progress Notes (Signed)
BPP done 10/16 @ MFM - 8/8 - next scheduled on 10/23

## 2017-12-14 ENCOUNTER — Ambulatory Visit (INDEPENDENT_AMBULATORY_CARE_PROVIDER_SITE_OTHER): Payer: Medicaid Other | Admitting: *Deleted

## 2017-12-14 ENCOUNTER — Ambulatory Visit (INDEPENDENT_AMBULATORY_CARE_PROVIDER_SITE_OTHER): Payer: Medicaid Other | Admitting: Family Medicine

## 2017-12-14 ENCOUNTER — Telehealth (HOSPITAL_COMMUNITY): Payer: Self-pay | Admitting: *Deleted

## 2017-12-14 ENCOUNTER — Ambulatory Visit (HOSPITAL_COMMUNITY)
Admission: RE | Admit: 2017-12-14 | Discharge: 2017-12-14 | Disposition: A | Discharge status: Home / Self Care | Payer: Medicaid Other | Source: Ambulatory Visit | Attending: Obstetrics and Gynecology | Admitting: Obstetrics and Gynecology

## 2017-12-14 ENCOUNTER — Encounter (HOSPITAL_COMMUNITY): Payer: Self-pay

## 2017-12-14 VITALS — BP 135/85 | HR 97 | Wt 276.5 lb

## 2017-12-14 DIAGNOSIS — O10013 Pre-existing essential hypertension complicating pregnancy, third trimester: Secondary | ICD-10-CM | POA: Diagnosis not present

## 2017-12-14 DIAGNOSIS — O09293 Supervision of pregnancy with other poor reproductive or obstetric history, third trimester: Secondary | ICD-10-CM

## 2017-12-14 DIAGNOSIS — Z3A37 37 weeks gestation of pregnancy: Secondary | ICD-10-CM | POA: Diagnosis not present

## 2017-12-14 DIAGNOSIS — O10919 Unspecified pre-existing hypertension complicating pregnancy, unspecified trimester: Secondary | ICD-10-CM

## 2017-12-14 DIAGNOSIS — O99213 Obesity complicating pregnancy, third trimester: Secondary | ICD-10-CM | POA: Diagnosis not present

## 2017-12-14 DIAGNOSIS — O0990 Supervision of high risk pregnancy, unspecified, unspecified trimester: Secondary | ICD-10-CM

## 2017-12-14 DIAGNOSIS — Z8759 Personal history of other complications of pregnancy, childbirth and the puerperium: Secondary | ICD-10-CM

## 2017-12-14 NOTE — Patient Instructions (Signed)

## 2017-12-14 NOTE — Telephone Encounter (Signed)
Preadmission screen  

## 2017-12-14 NOTE — Progress Notes (Signed)
   PRENATAL VISIT NOTE  Subjective:  Jody Palmer is a 27 y.o. [id number] at [redacted]w[redacted]d being seen today for ongoing prenatal care.  She is currently monitored for the following issues for this high-risk pregnancy and has History of shoulder dystocia in prior pregnancy; Supervision of high risk pregnancy, antepartum; Marijuana abuse; History of major abdominal surgery; Trichomonosis; Chronic hypertension during pregnancy, antepartum; and Obesity (BMI 35.0-39.9 without comorbidity) on their problem list.  Patient reports no complaints.  Contractions: Irregular. Vag. Bleeding: None.  Movement: Present. Denies leaking of fluid.   The following portions of the patient's history were reviewed and updated as appropriate: allergies, current medications, past family history, past medical history, past social history, past surgical history and problem list. Problem list updated.  Objective:   Vitals:   12/14/17 0838  BP: 135/85  Pulse: 97  Weight: 276 lb 8 oz (125.4 kg)    Fetal Status: Fetal Heart Rate (bpm): NST   Movement: Present     General:  Alert, oriented and cooperative. Patient is in no acute distress.  Skin: Skin is warm and dry. No rash noted.   Cardiovascular: Normal heart rate noted  Respiratory: Normal respiratory effort, no problems with respiration noted  Abdomen: Soft, gravid, appropriate for gestational age.  Pain/Pressure: Present     Pelvic: Cervical exam deferred        Extremities: Normal range of motion.  Edema: None  Mental Status: Normal mood and affect. Normal behavior. Normal judgment and thought content.  NST:  Baseline: 135 bpm, Variability: Good {> 6 bpm), Accelerations: Reactive and Decelerations: Absent   Assessment and Plan:  Pregnancy: [id number] at [redacted]w[redacted]d  1. Supervision of high risk pregnancy, antepartum GBS negative  2. Chronic hypertension during pregnancy, antepartum BP ok on Procardia and ASA  3. History of shoulder dystocia in prior pregnancy Growth  u/s today  Term labor symptoms and general obstetric precautions including but not limited to vaginal bleeding, contractions, leaking of fluid and fetal movement were reviewed in detail with the patient. Please refer to After Visit Summary for other counseling recommendations.  Return in about 1 week (around 12/21/2017) for as scheduled.  Future Appointments  Date Time Provider [place]  12/21/2017  8:00 AM WH-MFC [place] 3 WH-MFCUS MFC-US  12/21/2017 10:15 AM WOC-WOCA NST WOC-WOCA WOC  12/21/2017 11:15 AM Emily Filbert, MD WOC-WOCA WOC  12/24/2017 12:00 AM WH-BSSCHED ROOM WH-BSSCHED None    Donnamae Jude, MD

## 2017-12-14 NOTE — Progress Notes (Signed)
BPP @ MFM today, [place] for growth and BPP scheduled on 10/30. Pt requests refill of Zantac. IOL scheduled 11/2 @ midnight.

## 2017-12-19 ENCOUNTER — Encounter: Payer: Self-pay | Admitting: *Deleted

## 2017-12-21 ENCOUNTER — Ambulatory Visit (INDEPENDENT_AMBULATORY_CARE_PROVIDER_SITE_OTHER): Payer: Medicaid Other | Admitting: *Deleted

## 2017-12-21 ENCOUNTER — Ambulatory Visit (INDEPENDENT_AMBULATORY_CARE_PROVIDER_SITE_OTHER): Payer: Medicaid Other | Admitting: Obstetrics & Gynecology

## 2017-12-21 ENCOUNTER — Ambulatory Visit (HOSPITAL_COMMUNITY)
Admission: RE | Admit: 2017-12-21 | Discharge: 2017-12-21 | Disposition: A | Discharge status: Home / Self Care | Payer: Medicaid Other | Source: Ambulatory Visit | Attending: Maternal & Fetal Medicine | Admitting: Maternal & Fetal Medicine

## 2017-12-21 ENCOUNTER — Encounter (HOSPITAL_COMMUNITY): Payer: Self-pay

## 2017-12-21 VITALS — BP 137/91 | HR 74 | Wt 278.0 lb

## 2017-12-21 DIAGNOSIS — E669 Obesity, unspecified: Secondary | ICD-10-CM

## 2017-12-21 DIAGNOSIS — O99213 Obesity complicating pregnancy, third trimester: Secondary | ICD-10-CM | POA: Insufficient documentation

## 2017-12-21 DIAGNOSIS — O10913 Unspecified pre-existing hypertension complicating pregnancy, third trimester: Secondary | ICD-10-CM

## 2017-12-21 DIAGNOSIS — O10919 Unspecified pre-existing hypertension complicating pregnancy, unspecified trimester: Secondary | ICD-10-CM | POA: Diagnosis present

## 2017-12-21 DIAGNOSIS — Z3A38 38 weeks gestation of pregnancy: Secondary | ICD-10-CM | POA: Diagnosis not present

## 2017-12-21 DIAGNOSIS — O09293 Supervision of pregnancy with other poor reproductive or obstetric history, third trimester: Secondary | ICD-10-CM | POA: Insufficient documentation

## 2017-12-21 DIAGNOSIS — O0990 Supervision of high risk pregnancy, unspecified, unspecified trimester: Secondary | ICD-10-CM

## 2017-12-21 DIAGNOSIS — Z87798 Personal history of other (corrected) congenital malformations: Secondary | ICD-10-CM | POA: Insufficient documentation

## 2017-12-21 DIAGNOSIS — O10013 Pre-existing essential hypertension complicating pregnancy, third trimester: Secondary | ICD-10-CM | POA: Diagnosis not present

## 2017-12-21 NOTE — Progress Notes (Signed)
[place] for growth/BPP completed @ MFM today.  IOL scheduled 11/2 @ midnight

## 2017-12-21 NOTE — Progress Notes (Signed)
   PRENATAL VISIT NOTE  Subjective:  Jody Palmer is a 27 y.o. K7Q2595 at [redacted]w[redacted]d being seen today for ongoing prenatal care.  She is currently monitored for the following issues for this high-risk pregnancy and has History of shoulder dystocia in prior pregnancy; Supervision of high risk pregnancy, antepartum; Marijuana abuse; History of major abdominal surgery; Trichomonosis; Chronic hypertension during pregnancy, antepartum; and Obesity (BMI 35.0-39.9 without comorbidity) on their problem list.  Patient reports no complaints.  Contractions: Irregular. Vag. Bleeding: None.  Movement: Present. Denies leaking of fluid.   The following portions of the patient's history were reviewed and updated as appropriate: allergies, current medications, past family history, past medical history, past social history, past surgical history and problem list. Problem list updated.  Objective:   Vitals:   12/21/17 1011  BP: (!) 137/91  Pulse: 74  Weight: 278 lb (126.1 kg)    Fetal Status: Fetal Heart Rate (bpm): NST   Movement: Present     General:  Alert, oriented and cooperative. Patient is in no acute distress.  Skin: Skin is warm and dry. No rash noted.   Cardiovascular: Normal heart rate noted  Respiratory: Normal respiratory effort, no problems with respiration noted  Abdomen: Soft, gravid, appropriate for gestational age.  Pain/Pressure: Present     Pelvic: Cervical exam deferred        Extremities: Normal range of motion.  Edema: None  Mental Status: Normal mood and affect. Normal behavior. Normal judgment and thought content.   Assessment and Plan:  Pregnancy: G3O7564 at [redacted]w[redacted]d  1. Supervision of high risk pregnancy, antepartum   2. Chronic hypertension during pregnancy, antepartum - reassuring BPP today - IOL at 39 weeks scheduled  3. Obesity (BMI 35.0-39.9 without comorbidity) - MFM u/s today with growth at 67%  Term labor symptoms and general obstetric precautions including but  not limited to vaginal bleeding, contractions, leaking of fluid and fetal movement were reviewed in detail with the patient. Please refer to After Visit Summary for other counseling recommendations.  Return in about 5 weeks (around 01/25/2018) for PP visit.  IOL on 11/2.  Future Appointments  Date Time Provider [place]  12/21/2017 11:15 AM Emily Filbert, MD [place]  12/24/2017 12:00 AM WH-BSSCHED ROOM WH-BSSCHED None    Emily Filbert, MD

## 2017-12-24 ENCOUNTER — Other Ambulatory Visit: Payer: Self-pay

## 2017-12-24 ENCOUNTER — Inpatient Hospital Stay (HOSPITAL_COMMUNITY)
Admission: RE | Admit: 2017-12-24 | Discharge: 2017-12-26 | DRG: 807 | Disposition: A | Discharge status: Home / Self Care | Payer: Medicaid Other | Attending: Obstetrics and Gynecology | Admitting: Obstetrics and Gynecology

## 2017-12-24 ENCOUNTER — Inpatient Hospital Stay (HOSPITAL_COMMUNITY): Payer: Medicaid Other | Admitting: Anesthesiology

## 2017-12-24 ENCOUNTER — Encounter (HOSPITAL_COMMUNITY): Payer: Self-pay

## 2017-12-24 DIAGNOSIS — O9902 Anemia complicating childbirth: Secondary | ICD-10-CM | POA: Diagnosis present

## 2017-12-24 DIAGNOSIS — O99214 Obesity complicating childbirth: Secondary | ICD-10-CM | POA: Diagnosis present

## 2017-12-24 DIAGNOSIS — E669 Obesity, unspecified: Secondary | ICD-10-CM | POA: Diagnosis present

## 2017-12-24 DIAGNOSIS — O1002 Pre-existing essential hypertension complicating childbirth: Principal | ICD-10-CM | POA: Diagnosis present

## 2017-12-24 DIAGNOSIS — Z87891 Personal history of nicotine dependence: Secondary | ICD-10-CM | POA: Diagnosis not present

## 2017-12-24 DIAGNOSIS — Z3A39 39 weeks gestation of pregnancy: Secondary | ICD-10-CM

## 2017-12-24 DIAGNOSIS — O169 Unspecified maternal hypertension, unspecified trimester: Secondary | ICD-10-CM | POA: Diagnosis present

## 2017-12-24 DIAGNOSIS — Z7982 Long term (current) use of aspirin: Secondary | ICD-10-CM | POA: Diagnosis not present

## 2017-12-24 DIAGNOSIS — O1092 Unspecified pre-existing hypertension complicating childbirth: Secondary | ICD-10-CM | POA: Diagnosis not present

## 2017-12-24 DIAGNOSIS — D649 Anemia, unspecified: Secondary | ICD-10-CM | POA: Diagnosis present

## 2017-12-24 DIAGNOSIS — O0990 Supervision of high risk pregnancy, unspecified, unspecified trimester: Secondary | ICD-10-CM

## 2017-12-24 LAB — PROTEIN / CREATININE RATIO, URINE
CREATININE, URINE: 106 mg/dL
PROTEIN CREATININE RATIO: 0.08 mg/mg{creat} (ref 0.00–0.15)
Total Protein, Urine: 8 mg/dL

## 2017-12-24 LAB — CBC
HEMATOCRIT: 31.1 % — AB (ref 36.0–46.0)
Hemoglobin: 10.6 g/dL — ABNORMAL LOW (ref 12.0–15.0)
MCH: 28.3 pg (ref 26.0–34.0)
MCHC: 34.1 g/dL (ref 30.0–36.0)
MCV: 83.2 fL (ref 80.0–100.0)
NRBC: 0 % (ref 0.0–0.2)
Platelets: 283 10*3/uL (ref 150–400)
RBC: 3.74 MIL/uL — AB (ref 3.87–5.11)
RDW: 13.4 % (ref 11.5–15.5)
WBC: 6.6 10*3/uL (ref 4.0–10.5)

## 2017-12-24 LAB — COMPREHENSIVE METABOLIC PANEL
ALBUMIN: 2.6 g/dL — AB (ref 3.5–5.0)
ALT: 8 U/L (ref 0–44)
AST: 14 U/L — AB (ref 15–41)
Alkaline Phosphatase: 86 U/L (ref 38–126)
Anion gap: 9 (ref 5–15)
BUN: 11 mg/dL (ref 6–20)
CHLORIDE: 103 mmol/L (ref 98–111)
CO2: 22 mmol/L (ref 22–32)
Calcium: 9.1 mg/dL (ref 8.9–10.3)
Creatinine, Ser: 0.59 mg/dL (ref 0.44–1.00)
GFR calc Af Amer: >60 mL/min (ref >60)
GLUCOSE: 80 mg/dL (ref 70–99)
POTASSIUM: 3.6 mmol/L (ref 3.5–5.1)
Sodium: 134 mmol/L — ABNORMAL LOW (ref 135–145)
TOTAL PROTEIN: 6.7 g/dL (ref 6.5–8.1)
Total Bilirubin: 0.6 mg/dL (ref 0.3–1.2)

## 2017-12-24 LAB — TYPE AND SCREEN
ABO/RH(D): O POS
ANTIBODY SCREEN: NEGATIVE

## 2017-12-24 LAB — RPR: RPR Ser Ql: NONREACTIVE

## 2017-12-24 MED ORDER — LIDOCAINE HCL (PF) 1 % IJ SOLN
INTRAMUSCULAR | Status: DC | PRN
  Administered 2017-12-24 (×2): 5 mL via EPIDURAL

## 2017-12-24 MED ORDER — SENNOSIDES-DOCUSATE SODIUM 8.6-50 MG PO TABS
2.0000 | ORAL_TABLET | ORAL | Status: DC
  Administered 2017-12-25 (×2): 2 via ORAL
  Filled 2017-12-24 (×2): qty 2

## 2017-12-24 MED ORDER — OXYCODONE-ACETAMINOPHEN 5-325 MG PO TABS: 2.0000 | ORAL_TABLET | ORAL | Status: DC | PRN

## 2017-12-24 MED ORDER — ACETAMINOPHEN 325 MG PO TABS
650.0000 mg | ORAL_TABLET | ORAL | Status: DC | PRN
  Administered 2017-12-24: 650 mg via ORAL
  Filled 2017-12-24: qty 2

## 2017-12-24 MED ORDER — LIDOCAINE HCL (PF) 1 % IJ SOLN
30.0000 mL | INTRAMUSCULAR | Status: DC | PRN
  Filled 2017-12-24: qty 30

## 2017-12-24 MED ORDER — LACTATED RINGERS IV SOLN
500.0000 mL | Freq: Once | INTRAVENOUS | Status: AC
  Administered 2017-12-24: 500 mL via INTRAVENOUS

## 2017-12-24 MED ORDER — FENTANYL 2.5 MCG/ML BUPIVACAINE 1/10 % EPIDURAL INFUSION (WH - ANES)
14.0000 mL/h | INTRAMUSCULAR | Status: DC | PRN
  Administered 2017-12-24: 14 mL/h via EPIDURAL
  Filled 2017-12-24: qty 100

## 2017-12-24 MED ORDER — ZOLPIDEM TARTRATE 5 MG PO TABS: 5.0000 mg | ORAL_TABLET | Freq: Every evening | ORAL | Status: DC | PRN

## 2017-12-24 MED ORDER — DIPHENHYDRAMINE HCL 25 MG PO CAPS: 25.0000 mg | ORAL_CAPSULE | Freq: Four times a day (QID) | ORAL | Status: DC | PRN

## 2017-12-24 MED ORDER — EPHEDRINE 5 MG/ML INJ
10.0000 mg | INTRAVENOUS | Status: DC | PRN
  Filled 2017-12-24: qty 2

## 2017-12-24 MED ORDER — ONDANSETRON HCL 4 MG/2ML IJ SOLN: 4.0000 mg | Freq: Four times a day (QID) | INTRAMUSCULAR | Status: DC | PRN

## 2017-12-24 MED ORDER — BENZOCAINE-MENTHOL 20-0.5 % EX AERO: 1.0000 "application " | INHALATION_SPRAY | CUTANEOUS | Status: DC | PRN

## 2017-12-24 MED ORDER — DIPHENHYDRAMINE HCL 50 MG/ML IJ SOLN: 12.5000 mg | INTRAMUSCULAR | Status: DC | PRN

## 2017-12-24 MED ORDER — OXYTOCIN 40 UNITS IN LACTATED RINGERS INFUSION - SIMPLE MED
1.0000 m[IU]/min | INTRAVENOUS | Status: DC
  Administered 2017-12-24: 2 m[IU]/min via INTRAVENOUS
  Filled 2017-12-24: qty 1000

## 2017-12-24 MED ORDER — OXYTOCIN 40 UNITS IN LACTATED RINGERS INFUSION - SIMPLE MED: 2.5000 [IU]/h | INTRAVENOUS | Status: DC

## 2017-12-24 MED ORDER — LACTATED RINGERS IV SOLN: 500.0000 mL | INTRAVENOUS | Status: DC | PRN

## 2017-12-24 MED ORDER — IBUPROFEN 600 MG PO TABS
600.0000 mg | ORAL_TABLET | Freq: Four times a day (QID) | ORAL | Status: DC
  Administered 2017-12-24 – 2017-12-26 (×7): 600 mg via ORAL
  Filled 2017-12-24 (×7): qty 1

## 2017-12-24 MED ORDER — SIMETHICONE 80 MG PO CHEW: 80.0000 mg | CHEWABLE_TABLET | ORAL | Status: DC | PRN

## 2017-12-24 MED ORDER — ACETAMINOPHEN 325 MG PO TABS: 650.0000 mg | ORAL_TABLET | ORAL | Status: DC | PRN

## 2017-12-24 MED ORDER — OXYCODONE-ACETAMINOPHEN 5-325 MG PO TABS: 1.0000 | ORAL_TABLET | ORAL | Status: DC | PRN

## 2017-12-24 MED ORDER — ONDANSETRON HCL 4 MG PO TABS: 4.0000 mg | ORAL_TABLET | ORAL | Status: DC | PRN

## 2017-12-24 MED ORDER — CEFAZOLIN SODIUM-DEXTROSE 2-4 GM/100ML-% IV SOLN
2.0000 g | Freq: Once | INTRAVENOUS | Status: AC
  Administered 2017-12-24: 2 g via INTRAVENOUS
  Filled 2017-12-24: qty 100

## 2017-12-24 MED ORDER — FLEET ENEMA 7-19 GM/118ML RE ENEM: 1.0000 | ENEMA | RECTAL | Status: DC | PRN

## 2017-12-24 MED ORDER — LACTATED RINGERS IV SOLN
INTRAVENOUS | Status: DC
  Administered 2017-12-24 (×2): via INTRAVENOUS

## 2017-12-24 MED ORDER — DIBUCAINE 1 % RE OINT: 1.0000 "application " | TOPICAL_OINTMENT | RECTAL | Status: DC | PRN

## 2017-12-24 MED ORDER — ONDANSETRON HCL 4 MG/2ML IJ SOLN: 4.0000 mg | INTRAMUSCULAR | Status: DC | PRN

## 2017-12-24 MED ORDER — COCONUT OIL OIL: 1.0000 "application " | TOPICAL_OIL | Status: DC | PRN

## 2017-12-24 MED ORDER — PHENYLEPHRINE 40 MCG/ML (10ML) SYRINGE FOR IV PUSH (FOR BLOOD PRESSURE SUPPORT)
80.0000 ug | PREFILLED_SYRINGE | INTRAVENOUS | Status: DC | PRN
  Filled 2017-12-24: qty 5
  Filled 2017-12-24: qty 10

## 2017-12-24 MED ORDER — TERBUTALINE SULFATE 1 MG/ML IJ SOLN
0.2500 mg | Freq: Once | INTRAMUSCULAR | Status: DC | PRN
  Filled 2017-12-24: qty 1

## 2017-12-24 MED ORDER — OXYTOCIN BOLUS FROM INFUSION
500.0000 mL | Freq: Once | INTRAVENOUS | Status: AC
  Administered 2017-12-24: 500 mL via INTRAVENOUS

## 2017-12-24 MED ORDER — MISOPROSTOL 25 MCG QUARTER TABLET
25.0000 ug | ORAL_TABLET | ORAL | Status: DC | PRN
  Administered 2017-12-24: 25 ug via VAGINAL
  Filled 2017-12-24 (×2): qty 1

## 2017-12-24 MED ORDER — TETANUS-DIPHTH-ACELL PERTUSSIS 5-2.5-18.5 LF-MCG/0.5 IM SUSP: 0.5000 mL | Freq: Once | INTRAMUSCULAR | Status: DC

## 2017-12-24 MED ORDER — PRENATAL MULTIVITAMIN CH
1.0000 | ORAL_TABLET | Freq: Every day | ORAL | Status: DC
  Administered 2017-12-25: 1 via ORAL
  Filled 2017-12-24: qty 1

## 2017-12-24 MED ORDER — NITROGLYCERIN 0.4 MG/SPRAY TL SOLN
1.0000 | Status: DC | PRN
  Administered 2017-12-24 (×2): 1 via SUBLINGUAL
  Filled 2017-12-24: qty 4.9

## 2017-12-24 MED ORDER — WITCH HAZEL-GLYCERIN EX PADS: 1.0000 "application " | MEDICATED_PAD | CUTANEOUS | Status: DC | PRN

## 2017-12-24 MED ORDER — SOD CITRATE-CITRIC ACID 500-334 MG/5ML PO SOLN: 30.0000 mL | ORAL | Status: DC | PRN

## 2017-12-24 MED ORDER — NITROGLYCERIN 0.4 MG/SPRAY TL SOLN
Status: AC
  Filled 2017-12-24: qty 4.9

## 2017-12-24 MED ORDER — NIFEDIPINE ER OSMOTIC RELEASE 30 MG PO TB24
30.0000 mg | ORAL_TABLET | Freq: Every day | ORAL | Status: DC
  Administered 2017-12-24: 30 mg via ORAL
  Filled 2017-12-24 (×2): qty 1

## 2017-12-24 MED ORDER — PHENYLEPHRINE 40 MCG/ML (10ML) SYRINGE FOR IV PUSH (FOR BLOOD PRESSURE SUPPORT)
80.0000 ug | PREFILLED_SYRINGE | INTRAVENOUS | Status: DC | PRN
  Filled 2017-12-24: qty 5

## 2017-12-24 NOTE — Progress Notes (Signed)
LABOR PROGRESS NOTE  Jody Palmer is a 27 y.o. D1V6160 at [redacted]w[redacted]d  admitted for IOL 2/2 chronic HTN  Subjective: Strip note. Discussed patient with RN.   Objective: BP (!) 129/91   Pulse 74   Temp (!) 97.5 F (36.4 C) (Oral)   Resp 17   Ht 5\' 8"  (1.727 m)   Wt 127.6 kg   LMP 03/26/2017   SpO2 100%   BMI 42.79 kg/m  or  Vitals:   12/24/17 0902 12/24/17 0932 12/24/17 1002 12/24/17 1030  BP: 118/62 111/65 (!) 129/91   Pulse: 70 73 74   Resp: 17 17 17 17   Temp:   98.2 F (36.8 C) (!) 97.5 F (36.4 C)  TempSrc:   Oral Oral  SpO2:      Weight:      Height:        Dilation: 9 Effacement (%): 90 Cervical Position: Middle Station: 0 Presentation: Vertex Exam by:: Adrian Prince, RNC FHT: baseline rate 120, moderate varibility, +acel, early and variable decel Toco: q1-64min  Labs: Lab Results  Component Value Date   WBC 6.6 12/24/2017   HGB 10.6 (L) 12/24/2017   HCT 31.1 (L) 12/24/2017   MCV 83.2 12/24/2017   PLT 283 12/24/2017    Patient Active Problem List   Diagnosis Date Noted  . Hypertension in pregnancy, antepartum 12/24/2017  . Obesity (BMI 35.0-39.9 without comorbidity) 08/19/2017  . Chronic hypertension during pregnancy, antepartum 07/22/2017  . 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 History of shoulder dystocia in prior pregnancy 06/30/2017  . Supervision of high risk pregnancy, antepartum 06/30/2017  . Marijuana abuse 06/30/2017  . History of major abdominal surgery 06/30/2017    Assessment / Plan: 27 y.o. G7P1051 at [redacted]w[redacted]d here for IOL 2/2 chronic HTN  Labor: Patient with SROM at [place] with subsequent quick cervical change. On pitocin at 6 mu/min.  Fetal Wellbeing:  Cat 2  Pain Control:  epidural Anticipated MOD:  VD  Chronic HTN: Mild range BPs, no signs or sx of PreE. UPC 0.08 and HELLP labs WNL.  - Continue home Procardia 30mg   Obesity  History of shoulder dystocia - Extra personnel in room during delivery - Normal EFW and AC at 38w  reassuring  Phill Myron, [place] Fellow, [facility] for [facility], [facility] 12/24/2017, 10:40 AM

## 2017-12-24 NOTE — Anesthesia Preprocedure Evaluation (Signed)
Anesthesia Evaluation  Patient identified by MRN, date of birth, ID band Patient awake    Reviewed: Allergy & Precautions, NPO status , Patient's Chart, lab work & pertinent test results  History of Anesthesia Complications Negative for: history of anesthetic complications  Airway Mallampati: III  TM Distance: >3 FB Neck ROM: Full    Dental no notable dental hx. (+) Dental Advisory Given   Pulmonary asthma , former smoker,    Pulmonary exam normal breath sounds clear to auscultation       Cardiovascular hypertension, Normal cardiovascular exam+ Valvular Problems/Murmurs  Rhythm:Regular Rate:Normal     Neuro/Psych negative neurological ROS  negative psych ROS   GI/Hepatic negative GI ROS, Neg liver ROS,   Endo/Other  negative endocrine ROS  Renal/GU negative Renal ROS  negative genitourinary   Musculoskeletal negative musculoskeletal ROS (+)   Abdominal   Peds negative pediatric ROS (+)  Hematology negative hematology ROS (+)   Anesthesia Other Findings   Reproductive/Obstetrics (+) Pregnancy                             Anesthesia Physical  Anesthesia Plan  ASA: III  Anesthesia Plan: Epidural   Post-op Pain Management:    Induction:   PONV Risk Score and Plan:   Airway Management Planned:   Additional Equipment:   Intra-op Plan:   Post-operative Plan:   Informed Consent: I have reviewed the patients History and Physical, chart, labs and discussed the procedure including the risks, benefits and alternatives for the proposed anesthesia with the patient or authorized representative who has indicated his/her understanding and acceptance.     Plan Discussed with: CRNA  Anesthesia Plan Comments:         Anesthesia Quick Evaluation

## 2017-12-24 NOTE — Lactation Note (Signed)
This note was copied from a baby's chart. Lactation Consultation Note  Patient Name: Jody Palmer [id number] Date: 12/24/2017 Reason for consult: Initial assessment;Term P2, 10 hour female infant. Per mom,  infant had 2 soiled and 3 stools since delivery. Per mom, she BF her 28 year old daughter for 6 months. Mom is active on [facility] program in [place]. Per mom, she has DEBP at home. LC entered room mom had infant latched on her right breast using the foot ball hold, LC extended lower jaw for deeper latch and brought infant closer to mom's breast,  "nose to breast." Per mom, she notice a difference , she could feel infant suckling with more depth and audible swallowing observed by LC and mom. Latch -9 Mom's goal is to breast feed past 6 months. Mom will BF according hunger cues, 8 to 12 times within 24 hours including nights. LC discussed I & O. Reviewed Baby & Me book's Breastfeeding Basics.  Mom made aware of O/P services, breastfeeding support groups, community resources, and our phone # for post-discharge questions.  Maternal Data Formula Feeding for Exclusion: No Has patient been taught Hand Expression?: Yes Does the patient have breastfeeding experience prior to this delivery?: Yes  Feeding Feeding Type: Breast Fed  LATCH Score Latch: Grasps breast easily, tongue down, lips flanged, rhythmical sucking.  Audible Swallowing: Spontaneous and intermittent  Type of Nipple: Everted at rest and after stimulation  Comfort (Breast/Nipple): Soft / non-tender  Hold (Positioning): Assistance needed to correctly position infant at breast and maintain latch.  LATCH Score: 9  Interventions Interventions: Breast feeding basics reviewed;Assisted with latch  Lactation Tools Discussed/Used WIC Program: Yes   Consult Status Consult Status: Follow-up Date: 01/01/18 Follow-up type: In-patient    Vicente Serene 12/24/2017, 9:54 PM

## 2017-12-24 NOTE — Anesthesia Postprocedure Evaluation (Signed)
Anesthesia Post Note  Patient: Jody Palmer  Procedure(s) Performed: AN AD HOC LABOR EPIDURAL     Patient location during evaluation: Mother Baby Anesthesia Type: Epidural Level of consciousness: awake and alert, oriented and patient cooperative Pain management: pain level controlled Vital Signs Assessment: post-procedure vital signs reviewed and stable Respiratory status: spontaneous breathing Cardiovascular status: stable Postop Assessment: no headache, epidural receding, patient able to bend at knees and no signs of nausea or vomiting Anesthetic complications: no Comments: Pain score 1.    Last Vitals:  Vitals:   12/24/17 1300 12/24/17 1416  BP: 135/75 (!) 144/93  Pulse: 73 85  Resp: 16   Temp: 36.4 C 36.9 C  SpO2:      Last Pain:  Vitals:   12/24/17 1416  TempSrc: Oral  PainSc: 0-No pain   Pain Goal:                 [facility]

## 2017-12-24 NOTE — Progress Notes (Signed)
LABOR PROGRESS NOTE  Jody Palmer is a 27 y.o. [id number] at [redacted]w[redacted]d  admitted for IOL 2/2 chronic HTN  Subjective: Doing well, feeling more pressure, desires epidural.  Foley out at 0530.  Objective: BP (!) 141/93   Pulse 77   Temp (!) 97.4 F (36.3 C) (Oral)   Resp 18   Ht 5\' 8"  (1.727 m)   Wt 127.6 kg   LMP 03/26/2017   BMI 42.79 kg/m  or  Vitals:   12/24/17 0318 12/24/17 0338 12/24/17 0403 12/24/17 0500  BP: 137/77  138/86 (!) 141/93  Pulse: 77  84 77  Resp:   18 18  Temp:  (!) 97.4 F (36.3 C)    TempSrc:  Oral    Weight:      Height:        Dilation: 4.5 Effacement (%): 50 Station: -2 Presentation: Vertex Exam by:: Franchot Erichsen, RNC FHT: baseline rate 130, moderate varibility, +acel, -decel Toco: q3-46min  Labs: Lab Results  Component Value Date   WBC 6.6 12/24/2017   HGB 10.6 (L) 12/24/2017   HCT 31.1 (L) 12/24/2017   MCV 83.2 12/24/2017   PLT 283 12/24/2017    Patient Active Problem List   Diagnosis Date Noted  . Hypertension in pregnancy, antepartum 12/24/2017  . Obesity (BMI 35.0-39.9 without comorbidity) 08/19/2017  . Chronic hypertension during pregnancy, antepartum 07/22/2017  . 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 History of shoulder dystocia in prior pregnancy 06/30/2017  . Supervision of high risk pregnancy, antepartum 06/30/2017  . Marijuana abuse 06/30/2017  . History of major abdominal surgery 06/30/2017    Assessment / Plan: 27 y.o. G7P1051 at [redacted]w[redacted]d here for IOL 2/2 chronic HTN  Labor: s/p cytotecx1 and FB, plan to start Pit Fetal Wellbeing:  Cat 1 Pain Control:  epidural Anticipated MOD:  VD  Chronic HTN: Mild range BPs, no signs or sx of PreE. - HELLP drawn 5/31 WNL, UPC 0.1 - Repeat HELLP WNL - Repeat UPC pending - Continue home Procardia 30mg   Obesity  History of shoulder dystocia - Extra personnel in room during delivery - Normal EFW and AC at 38w reassuring  Mena Goes, MD 12/24/2017, 6:10 AM

## 2017-12-24 NOTE — H&P (Addendum)
LABOR AND DELIVERY ADMISSION HISTORY AND PHYSICAL NOTE  Jody Palmer is a 27 y.o. female [phone] with IUP at [redacted]w[redacted]d by L/13 presenting for IOL.  She reports positive fetal movement. She denies leakage of fluid or vaginal bleeding.  Denies HA, RUQ pain, dyspnea.    Prenatal History/Complications: PNC at [facility] Pregnancy complications:  - Chronic HTN - tx w/ procardia on aspirin - Obesity - History of shoulder dystocia - H/o recurrent trich, last test negatve - Marijuana  Past Medical History: Past Medical History:  Diagnosis Date  . Asthma   . Depression   . Gastroschisis   . Heart murmur    at birth, no problems since  . History of gestational hypertension 06/30/2017   IOL for [facility] 2016  . Hypertension   . SBO (small bowel obstruction) (HCC)    this was during her first year of life, no problems since  . Trichomonas infection     Past Surgical History: Past Surgical History:  Procedure Laterality Date  . ABDOMINAL SURGERY    . GASTROSCHISIS CLOSURE  1991  . TONSILLECTOMY      Obstetrical History: OB History    Gravida  7   Para  1   Term  1   Preterm      AB  5   Living  1     SAB  1   TAB  4   Ectopic      Multiple  0   Live Births  1           Social History: Social History   Socioeconomic History  . Marital status: Single    Spouse name: Not on file  . Number of children: Not on file  . Years of education: Not on file  . Highest education level: Not on file  Occupational History  . Not on file  Social Needs  . Financial resource strain: Not on file  . Food insecurity:    Worry: Not on file    Inability: Not on file  . Transportation needs:    Medical: Not on file    Non-medical: Not on file  Tobacco Use  . Smoking status: Former Smoker    Types: Cigars  . Smokeless tobacco: Never Used  Substance and Sexual Activity  . Alcohol use: Not Currently    Comment: occ  . Drug use: Not Currently    Types: Marijuana   Comment: last use May 2015  . Sexual activity: Yes    Birth control/protection: None  Lifestyle  . Physical activity:    Days per week: Not on file    Minutes per session: Not on file  . Stress: Not on file  Relationships  . Social connections:    Talks on phone: Not on file    Gets together: Not on file    Attends religious service: Not on file    Active member of club or organization: Not on file    Attends meetings of clubs or organizations: Not on file    Relationship status: Not on file  Other Topics Concern  . Not on file  Social History Narrative  . Not on file    Family History: Family History  Problem Relation Age of Onset  . Diabetes Father   . Hypertension Father     Allergies: No Known Allergies  Medications Prior to Admission  Medication Sig Dispense Refill Last Dose  . acetaminophen (TYLENOL) 325 MG tablet Take 650-975 mg by mouth every 6 (  six) hours as needed for mild pain or headache.   Taking  . albuterol (PROVENTIL HFA;VENTOLIN HFA) 108 (90 BASE) MCG/ACT inhaler Inhale 1 puff into the lungs every 6 (six) hours as needed for wheezing or shortness of breath.   Not Taking  . aspirin 81 MG chewable tablet Chew 81 mg by mouth daily.   Taking  . NIFEdipine (PROCARDIA-XL/ADALAT-CC/NIFEDICAL-XL) 30 MG 24 hr tablet Take 30 mg by mouth daily.  11 Taking  . Prenatal Vit-Fe Fumarate-FA (PREPLUS) 27-1 MG TABS Take 1 tablet by mouth daily. 60 tablet 3 Taking  . ranitidine (ZANTAC) 150 MG tablet Take 1 tablet (150 mg total) by mouth 2 (two) times daily. (Patient not taking: Reported on 12/21/2017) 60 tablet 1 Not Taking     Review of Systems  All systems reviewed and negative except as stated in HPI  Physical Exam Last menstrual period 03/26/2017. General appearance: alert, oriented, NAD Lungs: normal respiratory effort Heart: regular rate Abdomen: soft, non-tender; gravid, FH appropriate for GA Extremities: No calf swelling or tenderness Presentation: cephalic  by sutures Fetal monitoring: Cat 1 Uterine activity: quiet    Prenatal labs: ABO, Rh: O/Positive/-- (04/18 0000) Antibody: Negative (04/18 0000) Rubella: Immune (04/18 0000) RPR: Non Reactive (08/20 0832)  HBsAg:   Neg HIV: Non Reactive (08/20 0832)  GC/Chlamydia: Negative GBS: Negative (10/09 1021)  2-hr GTT: Normal Genetic screening:  Negative Anatomy US: Negative  Prenatal Transfer Tool  Maternal Diabetes: No Genetic Screening: Normal Maternal Ultrasounds/Referrals: Normal Fetal Ultrasounds or other Referrals:  None Maternal Substance Abuse:  Yes:  Type: Marijuana Significant Maternal Medications:  Meds include: Other: Procardia Significant Maternal Lab Results: None  No results found for this or any previous visit (from the past 24 hour(s)).  Patient Active Problem List   Diagnosis Date Noted  . Obesity (BMI 35.0-39.9 without comorbidity) 08/19/2017  . Chronic hypertension during pregnancy, antepartum 07/22/2017  . 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 History of shoulder dystocia in prior pregnancy 06/30/2017  . Supervision of high risk pregnancy, antepartum 06/30/2017  . Marijuana abuse 06/30/2017  . History of major abdominal surgery 06/30/2017    Assessment: Jody Palmer is a 27 y.o. G7P1051 at [redacted]w[redacted]d here for IOL 2/2 chronic HTN  #Labor: Cytotec+foley-->pit #Pain: Epidural #FWB: 3305g 66th% and AC in 29th% at [redacted]w[redacted]d #ID:  GBS(-) #MOF: Breast #MOC: undecided #Circ:  NA  Chronic HTN: 140s/100s BP upon admission, no signs or sx of PreE. - HELLP drawn 5/31 WNL, UPC 0.1 - Repeat HELLP   Obesity  History of shoulder dystocia - Extra personnel in room during delivery - Normal EFW and AC at 38w reassuring  Jody Goes, MD 12/24/2017, 12:39 AM   CNM attestation:  I have seen and examined this patient; I agree with above documentation in the resident's note.   Jody Palmer is a 27 y.o. [phone] here for IOL due to cHTN with BP well controlled on Procardia XL  30mg ; also with hx of mild shoulder dystocia <7min with 3500 gm infant; current EFW is 3300gm  PE: BP (!) 155/94   Pulse 81   Temp 98.5 F (36.9 C) (Oral)   Resp 18   Ht 5\' 8"  (1.727 m)   Wt 127.6 kg   LMP 03/26/2017   BMI 42.79 kg/m  Gen: calm comfortable, NAD Resp: normal effort, no distress Abd: gravid  ROS, labs, PMH reviewed  Plan: Admit to [place] 30; collect pre-e labs Plan cx ripening with cytotec/cervical foley, then progress to Pit/AROM  prn Anticipate SVD  SHAW, KIMBERLY CNM 12/24/2017, 3:06 AM

## 2017-12-24 NOTE — Anesthesia Pain Management Evaluation Note (Signed)
  CRNA Pain Management Visit Note  Patient: Jody Palmer, 27 y.o., female  "Hello I am a member of the anesthesia team at [facility]. We have an anesthesia team available at all times to provide care throughout the hospital, including epidural management and anesthesia for C-section. I don't know your plan for the delivery whether it a natural birth, water birth, IV sedation, nitrous supplementation, doula or epidural, but we want to meet your pain goals."   1.Was your pain managed to your expectations on prior hospitalizations?   Yes   2.What is your expectation for pain management during this hospitalization?     Epidural  3.How can we help you reach that goal? unsure  Record the patient's initial score and the patient's pain goal.   Pain: 0  Pain Goal: 8 [facility] wants you to be able to say your pain was always managed very well.  Casimer Lanius 12/24/2017

## 2017-12-24 NOTE — Anesthesia Procedure Notes (Signed)
Epidural Patient location during procedure: OB  Staffing Anesthesiologist: Montez Hageman, MD Performed: anesthesiologist   Preanesthetic Checklist Completed: patient identified, site marked, surgical consent, pre-op evaluation, timeout performed, IV checked, risks and benefits discussed and monitors and equipment checked  Epidural Patient position: sitting Prep: DuraPrep Patient monitoring: heart rate, continuous pulse ox and blood pressure Approach: right paramedian Location: L4-L5 Injection technique: LOR saline  Needle:  Needle type: Tuohy  Needle gauge: 17 G Needle length: 9 cm and 9 Needle insertion depth: 9 cm Catheter type: closed end flexible Catheter size: 20 Guage Catheter at skin depth: 14 cm Test dose: negative  Assessment Events: blood not aspirated, injection not painful, no injection resistance, negative IV test and no paresthesia  Additional Notes Patient identified. Risks/Benefits/Options discussed with patient including but not limited to bleeding, infection, nerve damage, paralysis, failed block, incomplete pain control, headache, blood pressure changes, nausea, vomiting, reactions to medication both or allergic, itching and postpartum back pain. Confirmed with bedside nurse the patient's most recent platelet count. Confirmed with patient that they are not currently taking any anticoagulation, have any bleeding history or any family history of bleeding disorders. Patient expressed understanding and wished to proceed. All questions were answered. Sterile technique was used throughout the entire procedure. Please see nursing notes for vital signs. Test dose was given through epidural needle and negative prior to continuing to dose epidural or start infusion. Warning signs of high block given to the patient including shortness of breath, tingling/numbness in hands, complete motor block, or any concerning symptoms with instructions to call for help. Patient was given  instructions on fall risk and not to get out of bed. All questions and concerns addressed with instructions to call with any issues.

## 2017-12-25 ENCOUNTER — Encounter (HOSPITAL_COMMUNITY): Payer: Self-pay

## 2017-12-25 LAB — CBC
HCT: 32.1 % — ABNORMAL LOW (ref 36.0–46.0)
HEMOGLOBIN: 10.6 g/dL — AB (ref 12.0–15.0)
MCH: 27.9 pg (ref 26.0–34.0)
MCHC: 33 g/dL (ref 30.0–36.0)
MCV: 84.5 fL (ref 80.0–100.0)
Platelets: 278 10*3/uL (ref 150–400)
RBC: 3.8 MIL/uL — AB (ref 3.87–5.11)
RDW: 13.5 % (ref 11.5–15.5)
WBC: 7.2 10*3/uL (ref 4.0–10.5)
nRBC: 0 % (ref 0.0–0.2)

## 2017-12-25 MED ORDER — NIFEDIPINE ER OSMOTIC RELEASE 30 MG PO TB24
30.0000 mg | ORAL_TABLET | Freq: Every day | ORAL | Status: DC
  Administered 2017-12-25 – 2017-12-26 (×2): 30 mg via ORAL
  Filled 2017-12-25: qty 1

## 2017-12-25 NOTE — Progress Notes (Signed)
POSTPARTUM PROGRESS NOTE  Post Partum Day 1  Subjective:  Jody Palmer is a 27 y.o. V8P9292 s/p SVD at [redacted]w[redacted]d with manual extraction of placenta, chronic HTN.  She reports she is doing well. No acute events overnight. She denies any problems with ambulating or po intake. No BM but passing gas. Denies nausea or vomiting. No dizziness or lightheadedness. No calf pain or SOB. Pain is well controlled.  Lochia is moderate but decreasing.  Objective: Blood pressure 125/88, pulse 80, temperature 98.1 F (36.7 C), temperature source Oral, resp. rate 16, height 5\' 8"  (1.727 m), weight 127.6 kg, last menstrual period 03/26/2017, SpO2 100 %, unknown if currently breastfeeding.  Physical Exam:  General: alert, cooperative and no distress Chest: no respiratory distress Heart:regular rate, distal pulses intact Abdomen: soft, nontender,  Uterine Fundus: firm, appropriately tender DVT Evaluation: No calf swelling or tenderness Extremities: mild LE edema Skin: warm, dry  Recent Labs    12/24/17 0219 12/25/17 0623  HGB 10.6* 10.6*  HCT 31.1* 32.1*    Assessment/Plan: Jody Palmer is a 27 y.o. [id number] s/p SVD at [redacted]w[redacted]d w/manual extraction of placenta.   PPD#1 - Doing well  Routine postpartum care Anemia: asymptomatic, stable at 10.6 HTN: Procardia d/c'd yday Contraception: Pull out method, does not want hormonal or paraguard Feeding: breast and bottle Dispo: Plan for discharge tomorrow.   LOS: 1 day   Celedonio Savage, [facility] MS3 12/25/2017, 9:01 AM   I confirm that I have verified the information documented in the medical student's note and that I have also personally performed the physical exam and all medical decision making activities.  Mallie Snooks, CNM 12/25/17  11:45 AM

## 2017-12-25 NOTE — Progress Notes (Signed)
CSW received consult for hx of marijuana use.  Referral was screened out due to the following: ~MOB had no documented substance use after initial prenatal visit/+UPT. ~MOB had no positive drug screens after initial prenatal visit/+UPT. ~Baby's UDS is negative.  Please consult CSW if current concerns arise or by MOB's request.  CSW will monitor CDS results and make report to Child Protective Services if warranted.  Jody Palmer, [place]  [phone]

## 2017-12-26 MED ORDER — NIFEDIPINE ER OSMOTIC RELEASE 30 MG PO TB24: 30.0000 mg | ORAL_TABLET | Freq: Every day | ORAL | 5 refills | Status: DC

## 2017-12-26 MED ORDER — IBUPROFEN 600 MG PO TABS: 600.0000 mg | ORAL_TABLET | Freq: Four times a day (QID) | ORAL | 0 refills | Status: DC

## 2017-12-26 NOTE — Discharge Summary (Signed)
Postpartum Discharge Summary     Patient Name: Jody Palmer DOB: 09-25-1990 MRN: [id number]  Date of admission: 12/24/2017 Delivering Provider: Orlene Plum A   Date of discharge: 12/26/2017  Admitting diagnosis: 39wks induction  Intrauterine pregnancy: [redacted]w[redacted]d     Secondary diagnosis:  Active Problems:   Supervision of high risk pregnancy, antepartum   Hypertension in pregnancy, antepartum  Additional problems: None     Discharge diagnosis: Term Pregnancy Delivered and Gestational Hypertension                                                                                                Post partum procedures:manual extraction of placenta  Augmentation: Pitocin, Cytotec and Foley Balloon  Complications: Retained placenta   Hospital course:  Induction of Labor With Vaginal Delivery   27 y.o. yo [id number] at [redacted]w[redacted]d was admitted to the hospital 12/24/2017 for induction of labor.  Indication for induction: Gestational hypertension.  Patient had an uncomplicated labor course as follows: Membrane Rupture Time/Date: 9:40 AM ,12/24/2017   Intrapartum Procedures: Episiotomy: None [1]                                         Lacerations:  None [1]  Patient had delivery of a Viable infant.  Information for the patient's newborn:  Symone, Cornman [[id number]]  Delivery Method: Vaginal, Spontaneous(Filed from Delivery Summary)   12/24/2017  Details of delivery can be found in separate delivery note.  Patient had a routine postpartum course. Patient is discharged home 12/26/17.  Magnesium Sulfate recieved: No BMZ received: No  Physical exam  Vitals:   12/25/17 1220 12/25/17 1340 12/25/17 2224 12/26/17 0538  BP: (!) 129/103 131/89 124/86 128/83  Pulse: 79 73 76 77  Resp:   18 18  Temp:   97.7 F (36.5 C) 98 F (36.7 C)  TempSrc:   Oral Oral  SpO2:   98% 97%  Weight:      Height:       General: alert, cooperative and no distress Lochia: appropriate Uterine Fundus:  firm Incision: N/A DVT Evaluation: No evidence of DVT seen on physical exam. Labs: Lab Results  Component Value Date   WBC 7.2 12/25/2017   HGB 10.6 (L) 12/25/2017   HCT 32.1 (L) 12/25/2017   MCV 84.5 12/25/2017   PLT 278 12/25/2017   CMP Latest Ref Rng & Units 12/24/2017  Glucose 70 - 99 mg/dL 80  BUN 6 - 20 mg/dL 11  Creatinine 0.44 - 1.00 mg/dL 0.59  Sodium 135 - 145 mmol/L 134(L)  Potassium 3.5 - 5.1 mmol/L 3.6  Chloride 98 - 111 mmol/L 103  CO2 22 - 32 mmol/L 22  Calcium 8.9 - 10.3 mg/dL 9.1  Total Protein 6.5 - 8.1 g/dL 6.7  Total Bilirubin 0.3 - 1.2 mg/dL 0.6  Alkaline Phos 38 - 126 U/L 86  AST 15 - 41 U/L 14(L)  ALT 0 - 44 U/L 8    Discharge instruction: per After Visit Summary and "Baby and  Me Booklet".  After visit meds:  Allergies as of 12/26/2017   No Known Allergies     Medication List    STOP taking these medications   aspirin 81 MG chewable tablet   calcium carbonate 500 MG chewable tablet Commonly known as:  TUMS - dosed in mg elemental calcium     TAKE these medications   acetaminophen 325 MG tablet Commonly known as:  TYLENOL Take 650-975 mg by mouth every 6 (six) hours as needed for mild pain or headache.   albuterol 108 (90 Base) MCG/ACT inhaler Commonly known as:  PROVENTIL HFA;VENTOLIN HFA Inhale 1 puff into the lungs every 6 (six) hours as needed for wheezing or shortness of breath.   ibuprofen 600 MG tablet Commonly known as:  ADVIL,MOTRIN Take 1 tablet (600 mg total) by mouth every 6 (six) hours.   NIFEdipine 30 MG 24 hr tablet Commonly known as:  PROCARDIA-XL/NIFEDICAL-XL Take 1 tablet (30 mg total) by mouth daily.   PREPLUS 27-1 MG Tabs Take 1 tablet by mouth daily.   ranitidine 150 MG tablet Commonly known as:  ZANTAC Take 1 tablet (150 mg total) by mouth 2 (two) times daily.       Diet: low salt diet  Activity: Advance as tolerated. Pelvic rest for 6 weeks.   Outpatient follow up: 2 and 6 weeks Follow up Appt:No  future appointments. Follow up Visit:   Please schedule this patient for Postpartum visit in: 2 and 6 weeks with the following provider: Any provider For C/S patients schedule nurse incision check in weeks 2 weeks: no High risk pregnancy complicated by: HTN Delivery mode:  SVD Anticipated Birth Control:  other/unsure PP Procedures needed: BP check  Schedule Integrated BH visit: no      Newborn Data: Live born female  Birth Weight: 6 lb 9.2 oz (2982 g) APGAR: 9, 9  Newborn Delivery   Birth date/time:  12/24/2017 10:56:00 Delivery type:  Vaginal, Spontaneous     Baby Feeding: Bottle and Breast Disposition:home with mother   12/26/2017 Aura Camps, MD

## 2017-12-26 NOTE — Lactation Note (Signed)
This note was copied from a baby's chart. Lactation Consultation Note  Patient Name: Jody Palmer [id number] Date: 12/26/2017 Reason for consult: Follow-up assessment;Term Mom reports good feedings.  Baby is at a 6% weight loss. Discussed milk coming to volume.  She has a breast pump for home use.  Lactation outpatient services and support reviewed and encouraged prn.  Maternal Data    Feeding Feeding Type: Breast Fed  LATCH Score                   Interventions    Lactation Tools Discussed/Used     Consult Status Consult Status: Complete Follow-up type: Call as needed    Ave Filter 12/26/2017, 9:29 AM

## 2018-01-05 ENCOUNTER — Inpatient Hospital Stay (HOSPITAL_COMMUNITY)
Admission: AD | Admit: 2018-01-05 | Discharge: 2018-01-05 | Disposition: A | Discharge status: Home / Self Care | Payer: Medicaid Other | Source: Ambulatory Visit | Attending: Obstetrics & Gynecology | Admitting: Obstetrics & Gynecology

## 2018-01-05 ENCOUNTER — Encounter (HOSPITAL_COMMUNITY): Payer: Self-pay | Admitting: Emergency Medicine

## 2018-01-05 DIAGNOSIS — O165 Unspecified maternal hypertension, complicating the puerperium: Secondary | ICD-10-CM

## 2018-01-05 DIAGNOSIS — O1003 Pre-existing essential hypertension complicating the puerperium: Secondary | ICD-10-CM | POA: Insufficient documentation

## 2018-01-05 DIAGNOSIS — R030 Elevated blood-pressure reading, without diagnosis of hypertension: Secondary | ICD-10-CM | POA: Diagnosis present

## 2018-01-05 LAB — COMPREHENSIVE METABOLIC PANEL
ALK PHOS: 89 U/L (ref 38–126)
ALT: 18 U/L (ref 0–44)
ANION GAP: 9 (ref 5–15)
AST: 26 U/L (ref 15–41)
Albumin: 3.2 g/dL — ABNORMAL LOW (ref 3.5–5.0)
BILIRUBIN TOTAL: 0.2 mg/dL — AB (ref 0.3–1.2)
BUN: 14 mg/dL (ref 6–20)
CALCIUM: 8.4 mg/dL — AB (ref 8.9–10.3)
CO2: 26 mmol/L (ref 22–32)
Chloride: 105 mmol/L (ref 98–111)
Creatinine, Ser: 0.82 mg/dL (ref 0.44–1.00)
GFR calc Af Amer: >60 mL/min (ref >60)
GFR calc non Af Amer: >60 mL/min (ref >60)
Glucose, Bld: 82 mg/dL (ref 70–99)
POTASSIUM: 3.4 mmol/L — AB (ref 3.5–5.1)
Sodium: 140 mmol/L (ref 135–145)
Total Protein: 7.3 g/dL (ref 6.5–8.1)

## 2018-01-05 LAB — URINALYSIS, ROUTINE W REFLEX MICROSCOPIC
BILIRUBIN URINE: NEGATIVE
GLUCOSE, UA: NEGATIVE mg/dL
Hgb urine dipstick: NEGATIVE
KETONES UR: NEGATIVE mg/dL
NITRITE: NEGATIVE
PROTEIN: NEGATIVE mg/dL
Specific Gravity, Urine: 1.017 (ref 1.005–1.030)
pH: 5 (ref 5.0–8.0)

## 2018-01-05 LAB — CBC
HCT: 38.6 % (ref 36.0–46.0)
Hemoglobin: 12.5 g/dL (ref 12.0–15.0)
MCH: 27.7 pg (ref 26.0–34.0)
MCHC: 32.4 g/dL (ref 30.0–36.0)
MCV: 85.6 fL (ref 80.0–100.0)
PLATELETS: 365 10*3/uL (ref 150–400)
RBC: 4.51 MIL/uL (ref 3.87–5.11)
RDW: 13.8 % (ref 11.5–15.5)
WBC: 5.7 10*3/uL (ref 4.0–10.5)
nRBC: 0 % (ref 0.0–0.2)

## 2018-01-05 LAB — PROTEIN / CREATININE RATIO, URINE: Creatinine, Urine: 162 mg/dL

## 2018-01-05 NOTE — MAU Provider Note (Signed)
Chief Complaint  Patient presents with  . Hypertension     First Provider Initiated Contact with Patient 01/05/18 1736      S: Jody Palmer  is a 27 y.o. y.o. year old G84P2052 female at 16 days s/p SVD who presents to MAU with elevated blood pressures. Hx of chronic hypertension. Current blood pressure medication: procardia xl 30 mg QD (last took this morning).  Had severe range BP per this morning per home health nurse & was instructed to come in.   Associated symptoms: Denies Headache, denies vision changes, denies epigastric pain   O:  Patient Vitals for the past 24 hrs:  BP Temp Temp src Pulse Resp SpO2 Height Weight  01/05/18 1824 (!) 131/94 98 F (36.7 C) Oral 74 18 100 % - -  01/05/18 1816 (!) 131/94 - - 74 - - - -  01/05/18 1806 (!) 148/103 - - 80 - - - -  01/05/18 1731 (!) 144/89 - - 80 - - - -  01/05/18 1716 (!) 154/102 - - 85 - - - -  01/05/18 1701 (!) 156/102 - - 81 - - - -  01/05/18 1648 (!) 152/104 - - 82 - - - -  01/05/18 1646 (!) 142/96 - - 77 - - - -  01/05/18 1645 (!) 146/111 - - 80 - - - -  01/05/18 1641 (!) 155/99 - Oral 83 18 - - -  01/05/18 1618 (!) 144/104 97.7 F (36.5 C) - 82 18 - 5\' 8"  (1.727 m) 115.7 kg   General: NAD Heart: Regular rate Lungs: Normal rate and effort Abd: Soft, NT  Extremities: No Pedal edema Neuro: 2+ deep tendon reflexes, No clonus   Results for orders placed or performed during the hospital encounter of 01/05/18 (from the past 24 hour(s))  Urinalysis, Routine w reflex microscopic     Status: Abnormal   Collection Time: 01/05/18  4:59 PM  Result Value Ref Range   Color, Urine YELLOW YELLOW   APPearance CLEAR CLEAR   Specific Gravity, Urine 1.017 1.005 - 1.030   pH 5.0 5.0 - 8.0   Glucose, UA NEGATIVE NEGATIVE mg/dL   Hgb urine dipstick NEGATIVE NEGATIVE   Bilirubin Urine NEGATIVE NEGATIVE   Ketones, ur NEGATIVE NEGATIVE mg/dL   Protein, ur NEGATIVE NEGATIVE mg/dL   Nitrite NEGATIVE NEGATIVE   Leukocytes, UA SMALL  (A) NEGATIVE   RBC / HPF 0-5 0 - 5 RBC/hpf   WBC, UA 6-10 0 - 5 WBC/hpf   Bacteria, UA RARE (A) NONE SEEN   Squamous Epithelial / LPF 0-5 0 - 5   Mucus PRESENT   Protein / creatinine ratio, urine     Status: None   Collection Time: 01/05/18  4:59 PM  Result Value Ref Range   Creatinine, Urine 162.00 mg/dL   Total Protein, Urine <6 mg/dL   Protein Creatinine Ratio        0.00 - 0.15 mg/mg[Cre]  CBC     Status: None   Collection Time: 01/05/18  5:27 PM  Result Value Ref Range   WBC 5.7 4.0 - 10.5 K/uL   RBC 4.51 3.87 - 5.11 MIL/uL   Hemoglobin 12.5 12.0 - 15.0 g/dL   HCT 38.6 36.0 - 46.0 %   MCV 85.6 80.0 - 100.0 fL   MCH 27.7 26.0 - 34.0 pg   MCHC 32.4 30.0 - 36.0 g/dL   RDW 13.8 11.5 - 15.5 %   Platelets 365 150 - 400 K/uL   nRBC  0.0 0.0 - 0.2 %  Comprehensive metabolic panel     Status: Abnormal   Collection Time: 01/05/18  5:27 PM  Result Value Ref Range   Sodium 140 135 - 145 mmol/L   Potassium 3.4 (L) 3.5 - 5.1 mmol/L   Chloride 105 98 - 111 mmol/L   CO2 26 22 - 32 mmol/L   Glucose, Bld 82 70 - 99 mg/dL   BUN 14 6 - 20 mg/dL   Creatinine, Ser 0.82 0.44 - 1.00 mg/dL   Calcium 8.4 (L) 8.9 - 10.3 mg/dL   Total Protein 7.3 6.5 - 8.1 g/dL   Albumin 3.2 (L) 3.5 - 5.0 g/dL   AST 26 15 - 41 U/L   ALT 18 0 - 44 U/L   Alkaline Phosphatase 89 38 - 126 U/L   Total Bilirubin 0.2 (L) 0.3 - 1.2 mg/dL   GFR calc non Af Amer >60 >60 mL/min   GFR calc Af Amer >60 >60 mL/min   Anion gap 9 5 - 15    A:  Chronic hypertension in postpartum period  P:  Discharge home in stable condition per consult with Dr. Ilda Basset Preeclampsia precautions. Continue antihypertensives as prescribed Keep schedule PP visit  [name], NP 01/05/2018 6:29 PM

## 2018-01-05 NOTE — Discharge Instructions (Signed)
Postpartum Hypertension Postpartum hypertension is high blood pressure after pregnancy that remains higher than normal for more than two days after delivery. You may not realize that you have postpartum hypertension if your blood pressure is not being checked regularly. In some cases, postpartum hypertension will go away on its own, usually within a week of delivery. However, for some women, medical treatment is required to prevent serious complications, such as seizures or stroke. The following things can affect your blood pressure:  The type of delivery you had.  Having received IV fluids or other medicines during or after delivery.  What are the causes? Postpartum hypertension may be caused by any of the following or by a combination of any of the following:  Hypertension that existed before pregnancy (chronic hypertension).  Gestational hypertension.  Preeclampsia or eclampsia.  Receiving a lot of fluid through an IV during or after delivery.  Medicines.  HELLP syndrome.  Hyperthyroidism.  Stroke.  Other rare neurological or blood disorders.  In some cases, the cause may not be known. What increases the risk? Postpartum hypertension can be related to one or more risk factors, such as:  Chronic hypertension. In some cases, this may not have been diagnosed before pregnancy.  Obesity.  Type 2 diabetes.  Kidney disease.  Family history of preeclampsia.  Other medical conditions that cause hormonal imbalances.  What are the signs or symptoms? As with all types of hypertension, postpartum hypertension may not have any symptoms. Depending on how high your blood pressure is, you may experience:  Headaches. These may be mild, moderate, or severe. They may also be steady, constant, or sudden in onset (thunderclap headache).  Visual changes.  Dizziness.  Shortness of breath.  Swelling of your hands, feet, lower legs, or face. In some cases, you may have swelling in  more than one of these locations.  Heart palpitations or a racing heartbeat.  Difficulty breathing while lying down.  Decreased urination.  Other rare signs and symptoms may include:  Sweating more than usual. This lasts longer than a few days after delivery.  Chest pain.  Sudden dizziness when you get up from sitting or lying down.  Seizures.  Nausea or vomiting.  Abdominal pain.  How is this diagnosed? The diagnosis of postpartum hypertension is made through a combination of physical examination findings and testing of your blood and urine. You may also have additional tests, such as a CT scan or an MRI, to check for other complications of postpartum hypertension. How is this treated? When blood pressure is high enough to require treatment, your options may include:  Medicines to reduce blood pressure (antihypertensives). Tell your health care provider if you are breastfeeding or if you plan to breastfeed. There are many antihypertensive medicines that are safe to take while breastfeeding.  Stopping medicines that may be causing hypertension.  Treating medical conditions that are causing hypertension.  Treating the complications of hypertension, such as seizures, stroke, or kidney problems.  Your health care provider will also continue to monitor your blood pressure closely and repeatedly until it is within a safe range for you. Follow these instructions at home:  Take medicines only as directed by your health care provider.  Get regular exercise after your health care provider tells you that it is safe.  Follow your health care providers recommendations on fluid and salt restrictions.  Do not use any tobacco products, including cigarettes, chewing tobacco, or electronic cigarettes. If you need help quitting, ask your health care provider.  Keep all follow-up visits as directed by your health care provider. This is important. Contact a health care provider  if:  Your symptoms get worse.  You have new symptoms, such as: ? Headache. ? Dizziness. ? Visual changes. Get help right away if:  You develop a severe or sudden headache.  You have seizures.  You develop numbness or weakness on one side of your body.  You have difficulty thinking, speaking, or swallowing.  You develop severe abdominal pain.  You develop difficulty breathing, chest pain, a racing heartbeat, or heart palpitations. These symptoms may represent a serious problem that is an emergency. Do not wait to see if the symptoms will go away. Get medical help right away. Call your local emergency services (911 in the [place] Do not drive yourself to the hospital. This information is not intended to replace advice given to you by your health care provider. Make sure you discuss any questions you have with your health care provider. Document Released: 10/12/2013 Document Revised: 07/14/2015 Document Reviewed: 08/23/2013 Elsevier Interactive Patient Education  [employer].

## 2018-01-05 NOTE — MAU Note (Signed)
PP 12/24/2017. Pt has chronic HTN. Home nurse came out and B/P was elevated . Pt taking procardia. Denies any headache or visual problems. Advised to g come get checked.

## 2018-01-11 ENCOUNTER — Other Ambulatory Visit: Payer: Self-pay | Admitting: Nurse Practitioner

## 2018-01-11 ENCOUNTER — Encounter: Payer: Self-pay | Admitting: Nurse Practitioner

## 2018-01-11 ENCOUNTER — Encounter: Payer: Self-pay | Admitting: Family Medicine

## 2018-01-11 NOTE — Progress Notes (Signed)
Came at lunch and wanted a note to go back to work - is 3 weeks postpartum.  Had sent a note yesterday in [place].  Did not realize the office closed at lunch.   BP today is 123/74.  Will give note to go back to work parttime.  Is taking her BP medication and checking her BP at home.  Has been in normal range. Emphasized the importance of coming to her postpartum appointment scheduled 02/06/18.  Note given to go back to work parttime - wants to work some hours on the weekend for now.  Earlie Server, RN, MSN, NP-BC Nurse Practitioner, [facility] for [employer], [employer] 01/11/2018 12:58 PM

## 2018-02-06 ENCOUNTER — Ambulatory Visit: Payer: Medicaid Other | Admitting: Family Medicine

## 2018-02-06 ENCOUNTER — Encounter: Payer: Self-pay | Admitting: Family Medicine

## 2018-02-07 ENCOUNTER — Encounter: Payer: Self-pay | Admitting: Advanced Practice Midwife

## 2018-02-07 ENCOUNTER — Ambulatory Visit (INDEPENDENT_AMBULATORY_CARE_PROVIDER_SITE_OTHER): Payer: Medicaid Other | Admitting: Advanced Practice Midwife

## 2018-02-07 DIAGNOSIS — Z1389 Encounter for screening for other disorder: Secondary | ICD-10-CM | POA: Diagnosis not present

## 2018-02-07 DIAGNOSIS — Z30017 Encounter for initial prescription of implantable subdermal contraceptive: Secondary | ICD-10-CM | POA: Diagnosis not present

## 2018-02-07 DIAGNOSIS — I10 Essential (primary) hypertension: Secondary | ICD-10-CM

## 2018-02-07 DIAGNOSIS — Z3009 Encounter for other general counseling and advice on contraception: Secondary | ICD-10-CM

## 2018-02-07 LAB — POCT PREGNANCY, URINE: Preg Test, Ur: NEGATIVE

## 2018-02-07 MED ORDER — ETONOGESTREL 68 MG ~~LOC~~ IMPL
68.0000 mg | DRUG_IMPLANT | Freq: Once | SUBCUTANEOUS | Status: AC
  Administered 2018-02-07: 68 mg via SUBCUTANEOUS

## 2018-02-07 MED ORDER — NIFEDIPINE ER OSMOTIC RELEASE 30 MG PO TB24: 30.0000 mg | ORAL_TABLET | Freq: Two times a day (BID) | ORAL | 2 refills | Status: DC

## 2018-02-07 NOTE — Progress Notes (Signed)
Subjective:     Jody Palmer is a 27 y.o. female who presents for a postpartum visit. She is 5 weeks postpartum following a spontaneous vaginal delivery. I have fully reviewed the prenatal and intrapartum course. The delivery was at 34 gestational weeks. Outcome: spontaneous vaginal delivery. Anesthesia: epidural. Postpartum course has been uncomplicated. Baby's course has been uncomplicated. Baby is feeding by breast. Bleeding staining only. Bowel function is normal. Bladder function is normal. Patient is sexually active. Contraception method is undecided . Postpartum depression screening: negative.  Patient reports that she has had intercourse in the last 2 days. She did use withdrawal at that time. She is also exclusively breastfeeding.   The following portions of the patient's history were reviewed and updated as appropriate: allergies, current medications, past family history, past medical history, past social history, past surgical history and problem list.  Review of Systems Pertinent items are noted in HPI.   Objective:    BP (!) 151/100   Pulse 90   Wt 259 lb 14.4 oz (117.9 kg)   BMI 39.52 kg/m    Physical Exam Vitals signs and nursing note reviewed.  Constitutional:      General: She is not in acute distress. HENT:     Head: Normocephalic.  Cardiovascular:     Rate and Rhythm: Normal rate.  Pulmonary:     Effort: Pulmonary effort is normal.  Abdominal:     General: Abdomen is flat.  Neurological:     General: No focal deficit present.     Mental Status: She is alert.  Psychiatric:        Mood and Affect: Mood normal.        Behavior: Behavior normal.    Results for orders placed or performed in visit on 02/07/18 (from the past 24 hour(s))  Pregnancy, urine POC     Status: None   Collection Time: 02/07/18  5:53 PM  Result Value Ref Range   Preg Test, Ur NEGATIVE NEGATIVE    GYNECOLOGY OFFICE PROCEDURE NOTE  Nexplanon Insertion Procedure Patient identified,  informed consent performed, consent signed.   Patient does understand that irregular bleeding is a very common side effect of this medication. She was advised to have backup contraception for one week after placement. Pregnancy test in clinic today was negative.  Appropriate time out taken.  Patient's left arm was prepped and draped in the usual sterile fashion. The ruler used to measure and mark insertion area.  Patient was prepped with alcohol swab and then injected with 3 ml of 1% lidocaine.  She was prepped with betadine, Nexplanon removed from packaging,  Device confirmed in needle, then inserted full length of needle and withdrawn per handbook instructions. Nexplanon was able to palpated in the patient's arm; patient palpated the insert herself. There was minimal blood loss.  Patient insertion site covered with guaze and a pressure bandage to reduce any bruising.  The patient tolerated the procedure well and was given post procedure instructions.    Assessment:   1. Postpartum care and examination   2. Chronic hypertension   3. General counselling and advice on contraception   4. Nexplanon insertion    Plan:   Pap not due today: NIL on 06/09/17  Increase procardia to 60XL qd, and patient to FU with PCP for further management Nexplanon inserted today, patient to do UPT at home in 2 weeks   Marcille Buffy DNP, CNM  5:47 PM 02/07/18

## 2018-02-07 NOTE — Addendum Note (Signed)
Addended by: Michel Harrow on: 02/07/2018 06:14 PM   Modules accepted: Orders

## 2018-02-07 NOTE — Patient Instructions (Addendum)
Primary care follow up  Sick[facility] Cell Internal Medicine (will see you even if you do not have sickle cell): [phone] [facility]: [place] [facility]: [place] [phone]  Nexplanon Instructions After Insertion   Keep bandage clean and dry for 24 hours   May use ice/Tylenol/Ibuprofen for soreness or pain   If you develop fever, drainage or increased warmth from incision site-contact office immediately

## 2018-08-28 DIAGNOSIS — Z20828 Contact with and (suspected) exposure to other viral communicable diseases: Secondary | ICD-10-CM | POA: Diagnosis not present

## 2018-10-08 DIAGNOSIS — Z20828 Contact with and (suspected) exposure to other viral communicable diseases: Secondary | ICD-10-CM | POA: Diagnosis not present

## 2018-10-13 ENCOUNTER — Ambulatory Visit (HOSPITAL_COMMUNITY)
Admission: EM | Admit: 2018-10-13 | Discharge: 2018-10-13 | Disposition: A | Discharge status: Home / Self Care | Payer: Medicaid Other | Attending: Family Medicine | Admitting: Family Medicine

## 2018-10-13 ENCOUNTER — Encounter (HOSPITAL_COMMUNITY): Payer: Self-pay

## 2018-10-13 ENCOUNTER — Other Ambulatory Visit: Payer: Self-pay

## 2018-10-13 DIAGNOSIS — H60502 Unspecified acute noninfective otitis externa, left ear: Secondary | ICD-10-CM

## 2018-10-13 DIAGNOSIS — H6690 Otitis media, unspecified, unspecified ear: Secondary | ICD-10-CM

## 2018-10-13 MED ORDER — AMOXICILLIN-POT CLAVULANATE 875-125 MG PO TABS: 1.0000 | ORAL_TABLET | Freq: Two times a day (BID) | ORAL | 0 refills | Status: AC

## 2018-10-13 MED ORDER — NEOMYCIN-POLYMYXIN-HC 3.5-10000-1 OT SUSP: 4.0000 [drp] | Freq: Four times a day (QID) | OTIC | 0 refills | Status: AC

## 2018-10-13 NOTE — Discharge Instructions (Signed)
Your ear canal is very inflamed, red and swollen, which we will treat with ear drops.  It appears that your ear drum is infected as well, which we will treat with oral pills.  Complete course of antibiotics.  If symptoms worsen or do not improve in the next week to return to be seen or to follow up with your PCP.

## 2018-10-13 NOTE — ED Triage Notes (Signed)
Patient presents to Urgent Care with complaints of left ear pain since 2-3 days ago. Patient reports at first she thought she had just scratched it but the pain continues and now she is worried she may have an ear infection.

## 2018-10-13 NOTE — ED Provider Notes (Signed)
[place]    CSN: [id number] Arrival date & time: 10/13/18  0844      History   Chief Complaint Chief Complaint  Patient presents with  . Otalgia    HPI Jody Palmer is a 28 y.o. female.   Jody Palmer presents with complaints of left ear pain. Started three days ago, worsening. Feels swollen. Pain even with chewing, into the ear. No dental pain. No drainage from the ear. No change or decrease in hearing. No URI symptoms. No fevers. Denies any previous similar. Hasn't taken any medications for symptoms. States she does use the tip of a bobby pin to scratch the inside of her ear sometimes, but no specific known injury. History  Of asthma, depression, gastroschisis, htn, obesity.     ROS per HPI, negative if not otherwise mentioned.      Past Medical History:  Diagnosis Date  . Asthma   . Depression   . Gastroschisis   . Heart murmur    at birth, no problems since  . History of gestational hypertension 06/30/2017   IOL for [facility] 2016  . Hypertension   . SBO (small bowel obstruction) (HCC)    this was during her first year of life, no problems since  . Trichomonas infection     Patient Active Problem List   Diagnosis Date Noted  . Obesity (BMI 35.0-39.9 without comorbidity) 08/19/2017  . Chronic hypertension during pregnancy, antepartum 07/22/2017  . 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 History of shoulder dystocia in prior pregnancy 06/30/2017  . Supervision of high risk pregnancy, antepartum 06/30/2017  . Marijuana abuse 06/30/2017  . History of major abdominal surgery 06/30/2017    Past Surgical History:  Procedure Laterality Date  . ABDOMINAL SURGERY    . GASTROSCHISIS CLOSURE  1991  . TONSILLECTOMY      OB History    Gravida  7   Para  2   Term  2   Preterm      AB  5   Living  2     SAB  1   TAB  4   Ectopic      Multiple  0   Live Births  2            Home Medications    Prior to Admission medications    Medication Sig Start Date End Date Taking? Authorizing Provider  acetaminophen (TYLENOL) 325 MG tablet Take 650-975 mg by mouth every 6 (six) hours as needed for mild pain or headache.    [provider]  albuterol (PROVENTIL HFA;VENTOLIN HFA) 108 (90 BASE) MCG/ACT inhaler Inhale 1 puff into the lungs every 6 (six) hours as needed for wheezing or shortness of breath.    [provider]  amoxicillin-clavulanate (AUGMENTIN) 875-125 MG tablet Take 1 tablet by mouth every 12 (twelve) hours for 7 days. 10/13/18 10/20/18  Zigmund Gottron, NP  ibuprofen (ADVIL,MOTRIN) 600 MG tablet Take 1 tablet (600 mg total) by mouth every 6 (six) hours. Patient not taking: Reported on 02/07/2018 12/26/17   Aura Camps, MD  neomycin-polymyxin-hydrocortisone (CORTISPORIN) 3.5-10000-1 OTIC suspension Place 4 drops into the left ear 4 (four) times daily for 7 days. 10/13/18 10/20/18  Zigmund Gottron, NP  NIFEdipine (PROCARDIA XL) 30 MG 24 hr tablet Take 1 tablet (30 mg total) by mouth 2 (two) times daily. 02/07/18   Marcille Buffy D, CNM  Prenatal Vit-Fe Fumarate-FA (PREPLUS) 27-1 MG TABS Take 1 tablet by mouth daily. Patient not taking: Reported on  02/07/2018 06/30/17   Aletha Halim, MD  ranitidine (ZANTAC) 150 MG tablet Take 1 tablet (150 mg total) by mouth 2 (two) times daily. Patient not taking: Reported on 02/07/2018 08/19/17   Virginia Rochester, NP    Family History Family History  Problem Relation Age of Onset  . Diabetes Father   . Hypertension Father   . Healthy Mother     Social History Social History   Tobacco Use  . Smoking status: Current Some Day Smoker    Types: Cigars  . Smokeless tobacco: Never Used  . Tobacco comment: rarely smokes black & milds  Substance Use Topics  . Alcohol use: Not Currently    Comment: occ  . Drug use: Not Currently    Types: Marijuana    Comment: last use May 2015     Allergies   Patient has no known allergies.   Review of Systems  Review of Systems   Physical Exam Triage Vital Signs ED Triage Vitals  Enc Vitals Group     BP 10/13/18 0914 (!) 167/110     Pulse Rate 10/13/18 0914 88     Resp 10/13/18 0914 17     Temp 10/13/18 0914 98.5 F (36.9 C)     Temp Source 10/13/18 0914 Oral     SpO2 10/13/18 0914 99 %     Weight --      Height --      Head Circumference --      Peak Flow --      Pain Score 10/13/18 0909 7     Pain Loc --      Pain Edu? --      Excl. in [place]? --    No data found.  Updated Vital Signs BP (!) 167/110 (BP Location: Left Arm)   Pulse 88   Temp 98.5 F (36.9 C) (Oral)   Resp 17   SpO2 99%    Physical Exam Constitutional:      General: She is not in acute distress.    Appearance: She is well-developed.  HENT:     Left Ear: Hearing normal. Swelling and tenderness present. No drainage. There is no impacted cerumen. No foreign body. Tympanic membrane is retracted.     Ears:     Comments: Canal is red, swollen and tender; left TM is opaque, retracted Cardiovascular:     Rate and Rhythm: Normal rate.  Pulmonary:     Effort: Pulmonary effort is normal.  Skin:    General: Skin is warm and dry.  Neurological:     Mental Status: She is alert and oriented to person, place, and time.      UC Treatments / Results  Labs (all labs ordered are listed, but only abnormal results are displayed) Labs Reviewed - No data to display  EKG   Radiology No results found.  Procedures Procedures (including critical care time)  Medications Ordered in UC Medications - No data to display  Initial Impression / Assessment and Plan / UC Course  I have reviewed the triage vital signs and the nursing notes.  Pertinent labs & imaging results that were available during my care of the patient were reviewed by me and considered in my medical decision making (see chart for details).     Concern for both AOM as well as AOE with oral augmentin provided as well as cortisporin to help with  swelling and pain of canal. Return precautions provided. Patient verbalized understanding and agreeable to plan.  Final Clinical Impressions(s) / UC Diagnoses   Final diagnoses:  Acute otitis externa of left ear, unspecified type  Acute otitis media, unspecified otitis media type     Discharge Instructions     Your ear canal is very inflamed, red and swollen, which we will treat with ear drops.  It appears that your ear drum is infected as well, which we will treat with oral pills.  Complete course of antibiotics.  If symptoms worsen or do not improve in the next week to return to be seen or to follow up with your PCP.     ED Prescriptions    Medication Sig Dispense Auth. Provider   amoxicillin-clavulanate (AUGMENTIN) 875-125 MG tablet Take 1 tablet by mouth every 12 (twelve) hours for 7 days. 14 tablet Augusto Gamble B, NP   neomycin-polymyxin-hydrocortisone (CORTISPORIN) 3.5-10000-1 OTIC suspension Place 4 drops into the left ear 4 (four) times daily for 7 days. 10 mL Zigmund Gottron, NP     Controlled Substance Prescriptions [place] Controlled Substance Registry consulted? Not Applicable   Zigmund Gottron, NP 10/13/18 1045

## 2018-10-23 DIAGNOSIS — Z20828 Contact with and (suspected) exposure to other viral communicable diseases: Secondary | ICD-10-CM | POA: Diagnosis not present

## 2018-10-30 DIAGNOSIS — Z20828 Contact with and (suspected) exposure to other viral communicable diseases: Secondary | ICD-10-CM | POA: Diagnosis not present

## 2018-11-06 DIAGNOSIS — Z20828 Contact with and (suspected) exposure to other viral communicable diseases: Secondary | ICD-10-CM | POA: Diagnosis not present

## 2018-11-13 ENCOUNTER — Other Ambulatory Visit: Payer: Self-pay

## 2018-11-13 ENCOUNTER — Encounter (HOSPITAL_COMMUNITY): Payer: Self-pay | Admitting: Emergency Medicine

## 2018-11-13 ENCOUNTER — Ambulatory Visit (HOSPITAL_COMMUNITY)
Admission: EM | Admit: 2018-11-13 | Discharge: 2018-11-13 | Disposition: A | Discharge status: Home / Self Care | Payer: Medicaid Other | Attending: Family Medicine | Admitting: Family Medicine

## 2018-11-13 DIAGNOSIS — I10 Essential (primary) hypertension: Secondary | ICD-10-CM | POA: Insufficient documentation

## 2018-11-13 DIAGNOSIS — N898 Other specified noninflammatory disorders of vagina: Secondary | ICD-10-CM | POA: Insufficient documentation

## 2018-11-13 DIAGNOSIS — Z20828 Contact with and (suspected) exposure to other viral communicable diseases: Secondary | ICD-10-CM | POA: Diagnosis not present

## 2018-11-13 MED ORDER — AMLODIPINE BESYLATE 5 MG PO TABS: 5.0000 mg | ORAL_TABLET | Freq: Every day | ORAL | 1 refills | Status: AC

## 2018-11-13 NOTE — ED Provider Notes (Signed)
[place]    CSN: [id number] Arrival date & time: 11/13/18  [place]      History   Chief Complaint Chief Complaint  Patient presents with  . Vaginal Discharge    HPI Jody Palmer is a 28 y.o. female.   She is presenting with vaginal discharge.  This been ongoing for a couple of days.  She completed a couple of days of antibiotics about a week ago.  She feels no dysuria or changes in her bowel movements.  She tried a couple of days of yeast therapy but no change in her symptoms.  Is monogamous with her partner.  She is still breast-feeding.  Blood pressures been elevated.  She was placed on a blood pressure medication while she was pregnant.  Has not had that medication anymore.  Denies any chest pain or shortness of breath.  No leg swelling  HPI  Past Medical History:  Diagnosis Date  . Asthma   . Depression   . Gastroschisis   . Heart murmur    at birth, no problems since  . History of gestational hypertension 06/30/2017   IOL for [facility] 2016  . Hypertension   . SBO (small bowel obstruction) (HCC)    this was during her first year of life, no problems since  . Trichomonas infection     Patient Active Problem List   Diagnosis Date Noted  . Obesity (BMI 35.0-39.9 without comorbidity) 08/19/2017  . Chronic hypertension during pregnancy, antepartum 07/22/2017  . 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 History of shoulder dystocia in prior pregnancy 06/30/2017  . Supervision of high risk pregnancy, antepartum 06/30/2017  . Marijuana abuse 06/30/2017  . History of major abdominal surgery 06/30/2017    Past Surgical History:  Procedure Laterality Date  . ABDOMINAL SURGERY    . GASTROSCHISIS CLOSURE  1991  . TONSILLECTOMY      OB History    Gravida  7   Para  2   Term  2   Preterm      AB  5   Living  2     SAB  1   TAB  4   Ectopic      Multiple  0   Live Births  2            Home Medications    Prior to Admission medications   Medication  Sig Start Date End Date Taking? Authorizing Provider  acetaminophen (TYLENOL) 325 MG tablet Take 650-975 mg by mouth every 6 (six) hours as needed for mild pain or headache.    [provider]  albuterol (PROVENTIL HFA;VENTOLIN HFA) 108 (90 BASE) MCG/ACT inhaler Inhale 1 puff into the lungs every 6 (six) hours as needed for wheezing or shortness of breath.    [provider]  amLODipine (NORVASC) 5 MG tablet Take 1 tablet (5 mg total) by mouth daily. 11/13/18   Rosemarie Ax, MD  ibuprofen (ADVIL,MOTRIN) 600 MG tablet Take 1 tablet (600 mg total) by mouth every 6 (six) hours. Patient not taking: Reported on 02/07/2018 12/26/17   Aura Camps, MD  NIFEdipine (PROCARDIA XL) 30 MG 24 hr tablet Take 1 tablet (30 mg total) by mouth 2 (two) times daily. 02/07/18   Marcille Buffy D, CNM  Prenatal Vit-Fe Fumarate-FA (PREPLUS) 27-1 MG TABS Take 1 tablet by mouth daily. Patient not taking: Reported on 02/07/2018 06/30/17   Aletha Halim, MD  ranitidine (ZANTAC) 150 MG tablet Take 1 tablet (150 mg total) by mouth 2 (  two) times daily. Patient not taking: Reported on 02/07/2018 08/19/17   Virginia Rochester, NP    Family History Family History  Problem Relation Age of Onset  . Diabetes Father   . Hypertension Father   . Healthy Mother     Social History Social History   Tobacco Use  . Smoking status: Current Some Day Smoker    Types: Cigars  . Smokeless tobacco: Never Used  . Tobacco comment: rarely smokes black & milds  Substance Use Topics  . Alcohol use: Not Currently    Comment: occ  . Drug use: Not Currently    Types: Marijuana    Comment: last use May 2015     Allergies   Patient has no known allergies.   Review of Systems Review of Systems  Constitutional: Negative for fever.  HENT: Negative for congestion.   Respiratory: Negative for cough.   Cardiovascular: Negative for chest pain.  Gastrointestinal: Negative for abdominal pain.  Genitourinary:  Positive for vaginal discharge.  Musculoskeletal: Negative for back pain.  Skin: Negative for color change.  Neurological: Negative for weakness.  Hematological: Negative for adenopathy.     Physical Exam Triage Vital Signs ED Triage Vitals  Enc Vitals Group     BP 11/13/18 1848 (!) 176/90     Pulse Rate 11/13/18 1848 78     Resp 11/13/18 1848 18     Temp 11/13/18 1848 97.9 F (36.6 C)     Temp Source 11/13/18 1848 Temporal     SpO2 11/13/18 1848 100 %     Weight --      Height --      Head Circumference --      Peak Flow --      Pain Score 11/13/18 1849 0     Pain Loc --      Pain Edu? --      Excl. in [place]? --    No data found.  Updated Vital Signs BP (!) 176/90 (BP Location: Right Arm)   Pulse 78   Temp 97.9 F (36.6 C) (Temporal)   Resp 18   SpO2 100%   Visual Acuity Right Eye Distance:   Left Eye Distance:   Bilateral Distance:    Right Eye Near:   Left Eye Near:    Bilateral Near:     Physical Exam Gen: NAD, alert, cooperative with exam, well-appearing ENT: normal lips, normal nasal mucosa,  Eye: normal EOM, normal conjunctiva and lids CV:  no edema, +2 pedal pulses   Resp: no accessory muscle use, non-labored, clear to auscultation bilaterally GI: no masses or tenderness, no hernia  Skin: no rashes, no areas of induration  Neuro: normal tone, normal sensation to touch Psych:  normal insight, alert and oriented MSK: Normal gait, normal strength  UC Treatments / Results  Labs (all labs ordered are listed, but only abnormal results are displayed) Labs Reviewed  CERVICOVAGINAL ANCILLARY ONLY    EKG   Radiology No results found.  Procedures Procedures (including critical care time)  Medications Ordered in UC Medications - No data to display  Initial Impression / Assessment and Plan / UC Course  I have reviewed the triage vital signs and the nursing notes.  Pertinent labs & imaging results that were available during my care of the  patient were reviewed by me and considered in my medical decision making (see chart for details).     Jody Palmer is a 28 year old female is present with vaginal discharge and hypertension.  Cervical cytology was obtained and will call with results.  Amlodipine she does was provided for blood pressure.  Counseled that she may need follow-up with primary care she is still breast-feeding.  Counseled supportive care.  Give indications follow-up return.  Final Clinical Impressions(s) / UC Diagnoses   Final diagnoses:  Vaginal discharge  Essential hypertension     Discharge Instructions     We will call you with the results from today  Please check your blood pressure.  You may want to check with your primary doctor if they want you to start amlodipine for you blood pressure  Please follow up if your symptoms fail to improve.     ED Prescriptions    Medication Sig Dispense Auth. Provider   amLODipine (NORVASC) 5 MG tablet Take 1 tablet (5 mg total) by mouth daily. 30 tablet Rosemarie Ax, MD     PDMP not reviewed this encounter.   Rosemarie Ax, MD 11/13/18 2052

## 2018-11-13 NOTE — Discharge Instructions (Signed)
We will call you with the results from today  Please check your blood pressure.  You may want to check with your primary doctor if they want you to start amlodipine for you blood pressure  Please follow up if your symptoms fail to improve.

## 2018-11-13 NOTE — ED Triage Notes (Signed)
Pt here for vaginal discharge; pt sts similar to when she has had BV in past

## 2018-11-14 LAB — CERVICOVAGINAL ANCILLARY ONLY
Bacterial Vaginitis (gardnerella): NEGATIVE
Candida Glabrata: NEGATIVE
Candida Vaginitis: NEGATIVE
Molecular Disclaimer: NEGATIVE
Molecular Disclaimer: NEGATIVE
Molecular Disclaimer: NEGATIVE
Molecular Disclaimer: NORMAL
Trichomonas: NEGATIVE

## 2018-11-15 LAB — CERVICOVAGINAL ANCILLARY ONLY
Chlamydia: NEGATIVE
Neisseria Gonorrhea: NEGATIVE

## 2018-11-20 DIAGNOSIS — Z20828 Contact with and (suspected) exposure to other viral communicable diseases: Secondary | ICD-10-CM | POA: Diagnosis not present

## 2018-11-22 DIAGNOSIS — Z20828 Contact with and (suspected) exposure to other viral communicable diseases: Secondary | ICD-10-CM | POA: Diagnosis not present

## 2018-11-22 DIAGNOSIS — U071 COVID-19: Secondary | ICD-10-CM | POA: Diagnosis not present

## 2018-11-27 DIAGNOSIS — Z20828 Contact with and (suspected) exposure to other viral communicable diseases: Secondary | ICD-10-CM | POA: Diagnosis not present

## 2018-12-04 DIAGNOSIS — Z20828 Contact with and (suspected) exposure to other viral communicable diseases: Secondary | ICD-10-CM | POA: Diagnosis not present

## 2018-12-11 DIAGNOSIS — Z20828 Contact with and (suspected) exposure to other viral communicable diseases: Secondary | ICD-10-CM | POA: Diagnosis not present

## 2018-12-11 DIAGNOSIS — U071 COVID-19: Secondary | ICD-10-CM | POA: Diagnosis not present

## 2019-02-14 ENCOUNTER — Inpatient Hospital Stay
Admission: RE | Admit: 2019-02-14 | Discharge: 2019-02-14 | Disposition: A | Discharge status: Home / Self Care | Payer: Medicaid Other | Source: Ambulatory Visit

## 2019-03-13 ENCOUNTER — Other Ambulatory Visit: Payer: Self-pay

## 2019-03-13 ENCOUNTER — Encounter: Payer: Self-pay | Admitting: Advanced Practice Midwife

## 2019-03-13 ENCOUNTER — Ambulatory Visit (INDEPENDENT_AMBULATORY_CARE_PROVIDER_SITE_OTHER): Payer: Medicaid Other | Admitting: Advanced Practice Midwife

## 2019-03-13 VITALS — BP 163/109 | HR 70 | Wt 270.0 lb

## 2019-03-13 DIAGNOSIS — Z01419 Encounter for gynecological examination (general) (routine) without abnormal findings: Secondary | ICD-10-CM

## 2019-03-13 DIAGNOSIS — Z0000 Encounter for general adult medical examination without abnormal findings: Secondary | ICD-10-CM

## 2019-03-13 DIAGNOSIS — Z1272 Encounter for screening for malignant neoplasm of vagina: Secondary | ICD-10-CM

## 2019-03-13 MED ORDER — LISINOPRIL 5 MG PO TABS: 5.0000 mg | ORAL_TABLET | Freq: Every day | ORAL | 3 refills | Status: AC

## 2019-03-13 NOTE — Progress Notes (Signed)
GYNECOLOGY ANNUAL PREVENTATIVE CARE ENCOUNTER NOTE  Subjective:   Jody Palmer is a 29 y.o. [phone] female here for a routine annual gynecologic exam.  Current complaints: none.   Denies abnormal vaginal bleeding, discharge, pelvic pain, problems with intercourse or other gynecologic concerns.    Gynecologic History No LMP recorded. Patient has had an implant. Contraception: Nexplanon patient is happy with this birth control. Some light bleeding that is irregular.  Last Pap: 2019. Results were: normal Last mammogram: NA, age. Results were: NA, age  Obstetric History OB History  Gravida Para Term Preterm AB Living  7 2 2   5 2   SAB TAB Ectopic Multiple Live Births  1 4   0 2    # Outcome Date GA Lbr Len/2nd Weight Sex Delivery Anes PTL Lv  7 Term 12/24/17 [redacted]w[redacted]d 05:05 / 00:06 6 lb 9.2 oz (2.982 kg) F Vag-Spont EPI  LIV  6 TAB 2018          5 Term 03/21/14 [redacted]w[redacted]d 09:17 / 00:20 7 lb 12.5 oz (3.53 kg) F Vag-Spont EPI  LIV     Birth Comments: none  Induced for Gestational HTN  4 TAB 2016          3 TAB 2008          2 TAB 2008          1 SAB 2007            Past Medical History:  Diagnosis Date  . Asthma   . Depression   . Gastroschisis   . Heart murmur    at birth, no problems since  . History of gestational hypertension 06/30/2017   IOL for [facility] 2016  . Hypertension   . SBO (small bowel obstruction) (HCC)    this was during her first year of life, no problems since  . Trichomonas infection     Past Surgical History:  Procedure Laterality Date  . ABDOMINAL SURGERY    . GASTROSCHISIS CLOSURE  1991  . TONSILLECTOMY      Current Outpatient Medications on File Prior to Visit  Medication Sig Dispense Refill  . acetaminophen (TYLENOL) 325 MG tablet Take 650-975 mg by mouth every 6 (six) hours as needed for mild pain or headache.    . albuterol (PROVENTIL HFA;VENTOLIN HFA) 108 (90 BASE) MCG/ACT inhaler Inhale 1 puff into the lungs every 6 (six) hours as needed for wheezing  or shortness of breath.    Marland Kitchen amLODipine (NORVASC) 5 MG tablet Take 1 tablet (5 mg total) by mouth daily. 30 tablet 1  . ibuprofen (ADVIL,MOTRIN) 600 MG tablet Take 1 tablet (600 mg total) by mouth every 6 (six) hours. (Patient not taking: Reported on 02/07/2018) 30 tablet 0  . NIFEdipine (PROCARDIA XL) 30 MG 24 hr tablet Take 1 tablet (30 mg total) by mouth 2 (two) times daily. 60 tablet 2  . Prenatal Vit-Fe Fumarate-FA (PREPLUS) 27-1 MG TABS Take 1 tablet by mouth daily. (Patient not taking: Reported on 02/07/2018) 60 tablet 3  . ranitidine (ZANTAC) 150 MG tablet Take 1 tablet (150 mg total) by mouth 2 (two) times daily. (Patient not taking: Reported on 02/07/2018) 60 tablet 1   No current facility-administered medications on file prior to visit.    No Known Allergies  Social History   Socioeconomic History  . Marital status: Single    Spouse name: Not on file  . Number of children: Not on file  . Years of education: Not on file  .  Highest education level: Not on file  Occupational History  . Not on file  Tobacco Use  . Smoking status: Current Some Day Smoker    Types: Cigars  . Smokeless tobacco: Never Used  . Tobacco comment: rarely smokes black & milds  Substance and Sexual Activity  . Alcohol use: Not Currently    Comment: occ  . Drug use: Not Currently    Types: Marijuana    Comment: last use May 2015  . Sexual activity: Yes    Birth control/protection: None  Other Topics Concern  . Not on file  Social History Narrative  . Not on file   Social Determinants of Health   Financial Resource Strain:   . Difficulty of Paying Living Expenses: Not on file  Food Insecurity:   . Worried About Charity fundraiser in the Last Year: Not on file  . Ran Out of Food in the Last Year: Not on file  Transportation Needs:   . Lack of Transportation (Medical): Not on file  . Lack of Transportation (Non-Medical): Not on file  Physical Activity:   . Days of Exercise per Week: Not on  file  . Minutes of Exercise per Session: Not on file  Stress:   . Feeling of Stress : Not on file  Social Connections:   . Frequency of Communication with Friends and Family: Not on file  . Frequency of Social Gatherings with Friends and Family: Not on file  . Attends Religious Services: Not on file  . Active Member of Clubs or Organizations: Not on file  . Attends Archivist Meetings: Not on file  . Marital Status: Not on file  Intimate Partner Violence:   . Fear of Current or Ex-Partner: Not on file  . Emotionally Abused: Not on file  . Physically Abused: Not on file  . Sexually Abused: Not on file    Family History  Problem Relation Age of Onset  . Diabetes Father   . Hypertension Father   . Healthy Mother     The following portions of the patient's history were reviewed and updated as appropriate: allergies, current medications, past family history, past medical history, past social history, past surgical history and problem list.  Review of Systems Pertinent items noted in HPI and remainder of comprehensive ROS otherwise negative.   Objective:  BP (!) 163/109   Pulse 70   Wt 270 lb (122.5 kg)   BMI 41.05 kg/m  CONSTITUTIONAL: Well-developed, well-nourished female in no acute distress.  HENT:  Normocephalic, atraumatic, External right and left ear normal. Oropharynx is clear and moist EYES: Conjunctivae and EOM are normal. Pupils are equal, round, and reactive to light. No scleral icterus.  NECK: Normal range of motion, supple, no masses.  Normal thyroid.  SKIN: Skin is warm and dry. No rash noted. Not diaphoretic. No erythema. No pallor. NEUROLOGIC: Alert and oriented to person, place, and time. Normal reflexes, muscle tone coordination. No cranial nerve deficit noted. PSYCHIATRIC: Normal mood and affect. Normal behavior. Normal judgment and thought content. CARDIOVASCULAR: Normal heart rate noted, regular rhythm RESPIRATORY: Clear to auscultation  bilaterally. Effort and breath sounds normal, no problems with respiration noted. BREASTS: Symmetric in size. No masses, skin changes, nipple drainage, or lymphadenopathy. ABDOMEN: Soft, normal bowel sounds, no distention noted.  No tenderness, rebound or guarding.  MUSCULOSKELETAL: Normal range of motion. No tenderness.  No cyanosis, clubbing, or edema.  2+ distal pulses.   Assessment and Plan:  1. Smear, vaginal, as  part of routine gynecological examination  2. Well woman exam with routine gynecological exam - Ambulatory referral to Internal Medicine - Hypertensive today, will refer to PCP for management.  - lisinopril 5mg  daily rx give today   Routine preventative health maintenance measures emphasized. Please refer to After Visit Summary for other counseling recommendations.    Marcille Buffy DNP, CNM  03/13/19  2:54 PM

## 2020-06-09 ENCOUNTER — Ambulatory Visit (HOSPITAL_COMMUNITY)
Admission: EM | Admit: 2020-06-09 | Discharge: 2020-06-09 | Disposition: A | Discharge status: Home / Self Care | Payer: Medicaid Other | Attending: Family Medicine | Admitting: Family Medicine

## 2020-06-09 ENCOUNTER — Other Ambulatory Visit: Payer: Self-pay

## 2020-06-09 ENCOUNTER — Encounter (HOSPITAL_COMMUNITY): Payer: Self-pay

## 2020-06-09 DIAGNOSIS — J3089 Other allergic rhinitis: Secondary | ICD-10-CM

## 2020-06-09 DIAGNOSIS — I10 Essential (primary) hypertension: Secondary | ICD-10-CM | POA: Diagnosis not present

## 2020-06-09 DIAGNOSIS — H66002 Acute suppurative otitis media without spontaneous rupture of ear drum, left ear: Secondary | ICD-10-CM | POA: Diagnosis not present

## 2020-06-09 MED ORDER — AMOXICILLIN 875 MG PO TABS: 875.0000 mg | ORAL_TABLET | Freq: Two times a day (BID) | ORAL | 0 refills | Status: DC

## 2020-06-09 MED ORDER — CETIRIZINE HCL 10 MG PO TABS: 10.0000 mg | ORAL_TABLET | Freq: Every day | ORAL | 2 refills | Status: AC

## 2020-06-09 MED ORDER — FLUTICASONE PROPIONATE 50 MCG/ACT NA SUSP: 1.0000 | Freq: Two times a day (BID) | NASAL | 2 refills | Status: AC

## 2020-06-09 NOTE — ED Provider Notes (Signed)
[facility]    CSN: [id number] Arrival date & time: 06/09/20  0805      History   Chief Complaint Chief Complaint  Patient presents with  . Otalgia    HPI Jody Palmer is a 30 y.o. female.   Patient here today with 2-day history of worsening left ear pain, throbbing in nature.  States she is having muffled hearing and the pain is starting to radiate down into her jaw and up into her temple.  She denies fever, chills, body aches, sweats, congestion but has been having a postnasal drainage and cough for the last few weeks.  History of seasonal allergies as a youth but has not had significant issues in adulthood.  Does states and family members just got over a cold but otherwise no new sick contacts.  Not trying anything for symptoms thus far.     Past Medical History:  Diagnosis Date  . Asthma   . Depression   . Gastroschisis   . Heart murmur    at birth, no problems since  . History of gestational hypertension 06/30/2017   IOL for [facility] 2016  . Hypertension   . SBO (small bowel obstruction) (HCC)    this was during her first year of life, no problems since  . Trichomonas infection     Patient Active Problem List   Diagnosis Date Noted  . Obesity (BMI 35.0-39.9 without comorbidity) 08/19/2017  . Chronic hypertension during pregnancy, antepartum 07/22/2017  . 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 History of shoulder dystocia in prior pregnancy 06/30/2017  . Supervision of high risk pregnancy, antepartum 06/30/2017  . Marijuana abuse 06/30/2017  . History of major abdominal surgery 06/30/2017    Past Surgical History:  Procedure Laterality Date  . ABDOMINAL SURGERY    . GASTROSCHISIS CLOSURE  1991  . TONSILLECTOMY      OB History    Gravida  7   Para  2   Term  2   Preterm      AB  5   Living  2     SAB  1   IAB  4   Ectopic      Multiple  0   Live Births  2            Home Medications    Prior to Admission medications    Medication Sig Start Date End Date Taking? Authorizing Provider  amoxicillin (AMOXIL) 875 MG tablet Take 1 tablet (875 mg total) by mouth 2 (two) times daily. 06/09/20  Yes Volney American, PA-C  cetirizine (ZYRTEC ALLERGY) 10 MG tablet Take 1 tablet (10 mg total) by mouth daily. 06/09/20  Yes Volney American, PA-C  fluticasone [facility]) 50 MCG/ACT nasal spray Place 1 spray into both nostrils in the morning and at bedtime. 06/09/20  Yes Volney American, PA-C  acetaminophen (TYLENOL) 325 MG tablet Take 650-975 mg by mouth every 6 (six) hours as needed for mild pain or headache.    [provider]  albuterol (PROVENTIL HFA;VENTOLIN HFA) 108 (90 BASE) MCG/ACT inhaler Inhale 1 puff into the lungs every 6 (six) hours as needed for wheezing or shortness of breath.    [provider]  amLODipine (NORVASC) 5 MG tablet Take 1 tablet (5 mg total) by mouth daily. 11/13/18   Rosemarie Ax, MD  ibuprofen (ADVIL,MOTRIN) 600 MG tablet Take 1 tablet (600 mg total) by mouth every 6 (six) hours. Patient not taking: Reported on 02/07/2018 12/26/17   Doren Custard,  [place], MD  lisinopril (ZESTRIL) 5 MG tablet Take 1 tablet (5 mg total) by mouth daily. 03/13/19   Tresea Mall, CNM  NIFEdipine (PROCARDIA XL) 30 MG 24 hr tablet Take 1 tablet (30 mg total) by mouth 2 (two) times daily. 02/07/18   Marcille Buffy D, CNM  Prenatal Vit-Fe Fumarate-FA (PREPLUS) 27-1 MG TABS Take 1 tablet by mouth daily. Patient not taking: Reported on 02/07/2018 06/30/17   Aletha Halim, MD  ranitidine (ZANTAC) 150 MG tablet Take 1 tablet (150 mg total) by mouth 2 (two) times daily. Patient not taking: Reported on 02/07/2018 08/19/17   Virginia Rochester, NP    Family History Family History  Problem Relation Age of Onset  . Diabetes Father   . Hypertension Father   . Healthy Mother     Social History Social History   Tobacco Use  . Smoking status: Current Some Day Smoker    Types: Cigars  .  Smokeless tobacco: Never Used  . Tobacco comment: rarely smokes black & milds  Substance Use Topics  . Alcohol use: Not Currently    Comment: occ  . Drug use: Not Currently    Types: Marijuana    Comment: last use May 2015     Allergies   Patient has no known allergies.   Review of Systems Review of Systems Per HPI  Physical Exam Triage Vital Signs ED Triage Vitals  Enc Vitals Group     BP 06/09/20 0819 (!) 161/105     Pulse Rate 06/09/20 0819 95     Resp 06/09/20 0819 18     Temp 06/09/20 0819 98.3 F (36.8 C)     Temp Source 06/09/20 0819 Oral     SpO2 06/09/20 0819 100 %     Weight --      Height --      Head Circumference --      Peak Flow --      Pain Score 06/09/20 0818 8     Pain Loc --      Pain Edu? --      Excl. in [place]? --    No data found.  Updated Vital Signs BP (!) 161/105 (BP Location: Left Arm)   Pulse 95   Temp 98.3 F (36.8 C) (Oral)   Resp 18   LMP 06/03/2020 (Exact Date)   SpO2 100%   Visual Acuity Right Eye Distance:   Left Eye Distance:   Bilateral Distance:    Right Eye Near:   Left Eye Near:    Bilateral Near:     Physical Exam Vitals and nursing note reviewed.  Constitutional:      Appearance: Normal appearance. She is not ill-appearing.  HENT:     Head: Atraumatic.     Right Ear: Tympanic membrane normal.     Ears:     Comments: Left TM bulging, erythematous.  TM intact.  Canal benign    Nose: Nose normal.     Mouth/Throat:     Mouth: Mucous membranes are moist.     Pharynx: Oropharynx is clear. Posterior oropharyngeal erythema present. No oropharyngeal exudate.  Eyes:     Extraocular Movements: Extraocular movements intact.     Conjunctiva/sclera: Conjunctivae normal.  Cardiovascular:     Rate and Rhythm: Normal rate and regular rhythm.     Heart sounds: Normal heart sounds.  Pulmonary:     Effort: Pulmonary effort is normal.     Breath sounds: Normal breath sounds. No wheezing or  rales.  Musculoskeletal:         General: Normal range of motion.     Cervical back: Normal range of motion and neck supple.  Skin:    General: Skin is warm and dry.  Neurological:     Mental Status: She is alert and oriented to person, place, and time.  Psychiatric:        Mood and Affect: Mood normal.        Thought Content: Thought content normal.        Judgment: Judgment normal.      UC Treatments / Results  Labs (all labs ordered are listed, but only abnormal results are displayed) Labs Reviewed - No data to display  EKG   Radiology No results found.  Procedures Procedures (including critical care time)  Medications Ordered in UC Medications - No data to display  Initial Impression / Assessment and Plan / UC Course  I have reviewed the triage vital signs and the nursing notes.  Pertinent labs & imaging results that were available during my care of the patient were reviewed by me and considered in my medical decision making (see chart for details).     Possibly related to eustachian tube dysfunction, she states she had an ear infection on the side a few months ago as well.  Will trial Zyrtec and Flonase regimen as a preventative, treat this ear infection with amoxicillin.  Also discussed taking her blood pressure medication as soon as she got home as her blood pressure is elevated today.  DASH diet, exercise, stress reduction reviewed for better control.  Recheck blood pressure this afternoon and call PCP if not improving.  Final Clinical Impressions(s) / UC Diagnoses   Final diagnoses:  Acute suppurative otitis media of left ear without spontaneous rupture of tympanic membrane, recurrence not specified  Essential hypertension, benign  Seasonal allergic rhinitis due to other allergic trigger   Discharge Instructions   None    ED Prescriptions    Medication Sig Dispense Auth. Provider   amoxicillin (AMOXIL) 875 MG tablet Take 1 tablet (875 mg total) by mouth 2 (two) times daily. 20 tablet  Volney American, PA-C   fluticasone [facility]) 50 MCG/ACT nasal spray Place 1 spray into both nostrils in the morning and at bedtime. 16 g Volney American, [place]   cetirizine (ZYRTEC ALLERGY) 10 MG tablet Take 1 tablet (10 mg total) by mouth daily. 30 tablet Volney American, [place]     PDMP not reviewed this encounter.   Merrie Roof [place], [place] 06/09/20 [phone]

## 2020-06-09 NOTE — ED Triage Notes (Signed)
PT c/o left ear pain x 2 days. Pt states her left ear has been throbbing and aching.

## 2020-06-30 ENCOUNTER — Encounter (HOSPITAL_COMMUNITY): Payer: Self-pay | Admitting: *Deleted

## 2020-06-30 ENCOUNTER — Other Ambulatory Visit: Payer: Self-pay

## 2020-06-30 ENCOUNTER — Ambulatory Visit (HOSPITAL_COMMUNITY)
Admission: EM | Admit: 2020-06-30 | Discharge: 2020-06-30 | Disposition: A | Discharge status: Home / Self Care | Payer: Medicaid Other | Attending: Emergency Medicine | Admitting: Emergency Medicine

## 2020-06-30 DIAGNOSIS — R519 Headache, unspecified: Secondary | ICD-10-CM | POA: Insufficient documentation

## 2020-06-30 DIAGNOSIS — Z20822 Contact with and (suspected) exposure to covid-19: Secondary | ICD-10-CM | POA: Diagnosis not present

## 2020-06-30 DIAGNOSIS — J069 Acute upper respiratory infection, unspecified: Secondary | ICD-10-CM | POA: Diagnosis not present

## 2020-06-30 DIAGNOSIS — Z720 Tobacco use: Secondary | ICD-10-CM | POA: Insufficient documentation

## 2020-06-30 DIAGNOSIS — Z79899 Other long term (current) drug therapy: Secondary | ICD-10-CM | POA: Insufficient documentation

## 2020-06-30 DIAGNOSIS — J029 Acute pharyngitis, unspecified: Secondary | ICD-10-CM | POA: Diagnosis not present

## 2020-06-30 LAB — POCT RAPID STREP A, ED / UC: Streptococcus, Group A Screen (Direct): NEGATIVE

## 2020-06-30 LAB — POC INFLUENZA A AND B ANTIGEN (URGENT CARE ONLY)
INFLUENZA A ANTIGEN, POC: NEGATIVE
INFLUENZA B ANTIGEN, POC: NEGATIVE

## 2020-06-30 LAB — SARS CORONAVIRUS 2 (TAT 6-24 HRS): SARS Coronavirus 2: NEGATIVE

## 2020-06-30 MED ORDER — DEXAMETHASONE SODIUM PHOSPHATE 10 MG/ML IJ SOLN
INTRAMUSCULAR | Status: AC
  Filled 2020-06-30: qty 1

## 2020-06-30 MED ORDER — KETOROLAC TROMETHAMINE 30 MG/ML IJ SOLN
30.0000 mg | Freq: Once | INTRAMUSCULAR | Status: AC
  Administered 2020-06-30: 30 mg via INTRAMUSCULAR

## 2020-06-30 MED ORDER — DEXAMETHASONE SODIUM PHOSPHATE 10 MG/ML IJ SOLN
10.0000 mg | Freq: Once | INTRAMUSCULAR | Status: AC
  Administered 2020-06-30: 10 mg via INTRAMUSCULAR

## 2020-06-30 MED ORDER — KETOROLAC TROMETHAMINE 30 MG/ML IJ SOLN
INTRAMUSCULAR | Status: AC
  Filled 2020-06-30: qty 1

## 2020-06-30 MED ORDER — LIDOCAINE VISCOUS HCL 2 % MT SOLN: 15.0000 mL | OROMUCOSAL | 0 refills | Status: DC | PRN

## 2020-06-30 MED ORDER — IBUPROFEN 800 MG PO TABS: 800.0000 mg | ORAL_TABLET | Freq: Three times a day (TID) | ORAL | 0 refills | Status: DC | PRN

## 2020-06-30 NOTE — ED Notes (Signed)
PT requested lights in room to be turned off due to HA

## 2020-06-30 NOTE — ED Provider Notes (Signed)
[facility]    CSN: [id number] Arrival date & time: 06/30/20  5427      History   Chief Complaint Chief Complaint  Patient presents with  . Chills  . Headache  . Generalized Body Aches  . Neck Pain  . Dizziness    HPI Jody Palmer is a 30 y.o. female.    Patient presents with body aches, chills, congestion, rhinorrhea frontal headache made worse by light, sore throat  That makes it painful to swallow beginning Friday afternoon.  Decreased appetite but tolerating liquids.  Last full meal was Friday evening.  Patient has been eating at most a cup of yogurt.  Endorses left ear tenderness but recently had bilateral ear infection.  Completed antibiotic course.  Denies nausea, vomiting, diarrhea, abdominal pain, sinus pressure, shortness of breath.  No known sick contact.  Has attempted use of over-the-counter cold and flu medicine with no relief Past Medical History:  Diagnosis Date  . Asthma   . Depression   . Gastroschisis   . Heart murmur    at birth, no problems since  . History of gestational hypertension 06/30/2017   IOL for [facility] 2016  . Hypertension   . SBO (small bowel obstruction) (HCC)    this was during her first year of life, no problems since  . Trichomonas infection     Patient Active Problem List   Diagnosis Date Noted  . Obesity (BMI 35.0-39.9 without comorbidity) 08/19/2017  . Chronic hypertension during pregnancy, antepartum 07/22/2017  . 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 History of shoulder dystocia in prior pregnancy 06/30/2017  . Supervision of high risk pregnancy, antepartum 06/30/2017  . Marijuana abuse 06/30/2017  . History of major abdominal surgery 06/30/2017    Past Surgical History:  Procedure Laterality Date  . ABDOMINAL SURGERY    . GASTROSCHISIS CLOSURE  1991  . TONSILLECTOMY      OB History    Gravida  7   Para  2   Term  2   Preterm      AB  5   Living  2     SAB  1   IAB  4   Ectopic      Multiple  0    Live Births  2            Home Medications    Prior to Admission medications   Medication Sig Start Date End Date Taking? Authorizing Provider  acetaminophen (TYLENOL) 325 MG tablet Take 650-975 mg by mouth every 6 (six) hours as needed for mild pain or headache.    [provider]  albuterol (PROVENTIL HFA;VENTOLIN HFA) 108 (90 BASE) MCG/ACT inhaler Inhale 1 puff into the lungs every 6 (six) hours as needed for wheezing or shortness of breath.    [provider]  amLODipine (NORVASC) 5 MG tablet Take 1 tablet (5 mg total) by mouth daily. 11/13/18   Rosemarie Ax, MD  amoxicillin (AMOXIL) 875 MG tablet Take 1 tablet (875 mg total) by mouth 2 (two) times daily. 06/09/20   Volney American, PA-C  cetirizine (ZYRTEC ALLERGY) 10 MG tablet Take 1 tablet (10 mg total) by mouth daily. 06/09/20   Volney American, PA-C  fluticasone [facility]) 50 MCG/ACT nasal spray Place 1 spray into both nostrils in the morning and at bedtime. 06/09/20   Volney American, PA-C  ibuprofen (ADVIL) 800 MG tablet Take 1 tablet (800 mg total) by mouth every 8 (eight) hours as needed for headache  or moderate pain. 06/30/20   Patriece Archbold, Leitha Schuller, NP  lisinopril (ZESTRIL) 5 MG tablet Take 1 tablet (5 mg total) by mouth daily. 03/13/19   Tresea Mall, CNM  NIFEdipine (PROCARDIA XL) 30 MG 24 hr tablet Take 1 tablet (30 mg total) by mouth 2 (two) times daily. 02/07/18   Marcille Buffy D, CNM  Prenatal Vit-Fe Fumarate-FA (PREPLUS) 27-1 MG TABS Take 1 tablet by mouth daily. Patient not taking: Reported on 02/07/2018 06/30/17   Aletha Halim, MD  ranitidine (ZANTAC) 150 MG tablet Take 1 tablet (150 mg total) by mouth 2 (two) times daily. Patient not taking: Reported on 02/07/2018 08/19/17   Virginia Rochester, NP    Family History Family History  Problem Relation Age of Onset  . Diabetes Father   . Hypertension Father   . Healthy Mother     Social History Social History   Tobacco Use   . Smoking status: Current Some Day Smoker    Types: Cigars  . Smokeless tobacco: Never Used  . Tobacco comment: rarely smokes black & milds  Substance Use Topics  . Alcohol use: Not Currently    Comment: occ  . Drug use: Not Currently    Types: Marijuana    Comment: last use May 2015     Allergies   Patient has no known allergies.   Review of Systems Review of Systems  Defer to HPI    Physical Exam Triage Vital Signs ED Triage Vitals  Enc Vitals Group     BP 06/30/20 0939 (!) 163/118     Pulse Rate 06/30/20 0939 81     Resp 06/30/20 0939 20     Temp 06/30/20 0939 98.6 F (37 C)     Temp Source 06/30/20 0939 Oral     SpO2 06/30/20 0939 99 %     Weight --      Height --      Head Circumference --      Peak Flow --      Pain Score 06/30/20 0941 8     Pain Loc --      Pain Edu? --      Excl. in [place]? --    No data found.  Updated Vital Signs BP (!) 163/118 (BP Location: Left Arm)   Pulse 81   Temp 98.6 F (37 C) (Oral)   Resp 20   LMP 06/03/2020 (Exact Date)   SpO2 98%   Visual Acuity Right Eye Distance:   Left Eye Distance:   Bilateral Distance:    Right Eye Near:   Left Eye Near:    Bilateral Near:     Physical Exam Constitutional:      Appearance: She is well-developed. She is obese.  HENT:     Head: Normocephalic.     Right Ear: Tympanic membrane, ear canal and external ear normal.     Left Ear: Tympanic membrane, ear canal and external ear normal.     Nose: Congestion and rhinorrhea present.     Mouth/Throat:     Mouth: Mucous membranes are moist.     Pharynx: Posterior oropharyngeal erythema present.  Eyes:     Extraocular Movements: Extraocular movements intact.     Conjunctiva/sclera: Conjunctivae normal.     Pupils: Pupils are equal, round, and reactive to light.  Cardiovascular:     Rate and Rhythm: Normal rate and regular rhythm.     Pulses: Normal pulses.     Heart sounds: Normal heart sounds.  Pulmonary:     Effort: Pulmonary  effort is normal.     Breath sounds: Normal breath sounds.  Musculoskeletal:        General: Normal range of motion.     Cervical back: Normal range of motion.  Lymphadenopathy:     Cervical: Cervical adenopathy present.  Skin:    General: Skin is warm and dry.  Neurological:     General: No focal deficit present.     Mental Status: She is alert and oriented to person, place, and time. Mental status is at baseline.  Psychiatric:        Mood and Affect: Mood normal.        Behavior: Behavior normal.        Thought Content: Thought content normal.        Judgment: Judgment normal.      UC Treatments / Results  Labs (all labs ordered are listed, but only abnormal results are displayed) Labs Reviewed  CULTURE, GROUP A STREP ([place])  SARS CORONAVIRUS 2 (TAT 6-24 HRS)  POCT RAPID STREP A, ED / UC  POC INFLUENZA A AND B ANTIGEN (URGENT CARE ONLY)    EKG   Radiology No results found.  Procedures Procedures (including critical care time)  Medications Ordered in UC Medications  ketorolac (TORADOL) 30 MG/ML injection 30 mg (30 mg Intramuscular Given 06/30/20 1023)  dexamethasone (DECADRON) injection 10 mg (10 mg Intramuscular Given 06/30/20 1023)    Initial Impression / Assessment and Plan / UC Course  I have reviewed the triage vital signs and the nursing notes.  Pertinent labs & imaging results that were available during my care of the patient were reviewed by me and considered in my medical decision making (see chart for details).  Viral URI Bad headache  1.  Toradol 30 mg IM now and  Decadron 10 mg IM now, after 15 minutes patient feeling better, headache resolved.  3. Covid pending 4. Flu- negative 5. Rapid strep- negative 6. Ibuprofen 800 mg every 8 hours 7. Lidocaine viscous 2% prn   Final Clinical Impressions(s) / UC Diagnoses   Final diagnoses:  Viral URI  Bad headache     Discharge Instructions     Can use ibuprofen every 8 hours as needed to help  with headache  Can use over-the-counter Tylenol 650 mg every 6 hours as needed in addition to ibuprofen to help manage headache  Continue use of salt water gargles throat lozenges or hard candies, hot teas or cold liquids to preference or can try over-the-counter Chloraseptic spray to help with sore throat  Can use Mucinex to help thin secretions   ED Prescriptions    Medication Sig Dispense Auth. Provider   ibuprofen (ADVIL) 800 MG tablet Take 1 tablet (800 mg total) by mouth every 8 (eight) hours as needed for headache or moderate pain. 30 tablet Hans Eden, NP     PDMP not reviewed this encounter.   Hans Eden, NP 06/30/20 1112

## 2020-06-30 NOTE — ED Triage Notes (Signed)
Pt reports chills,HA,,Sore throat,dizzy . Pt reports her throat hurts to swallow.

## 2020-06-30 NOTE — Discharge Instructions (Addendum)
Can use ibuprofen every 8 hours as needed to help with headache  Can use over-the-counter Tylenol 650 mg every 6 hours as needed in addition to ibuprofen to help manage headache  Can use 15 mL of lidocaine solution, gargle and then spit it out to help with sore throat  Continue use of salt water gargles throat lozenges or hard candies, hot teas or cold liquids to preference or can try over-the-counter Chloraseptic spray to help with sore throat  Can use Mucinex to help thin secretions

## 2020-07-02 LAB — CULTURE, GROUP A STREP (THRC)

## 2020-07-23 DIAGNOSIS — Z0000 Encounter for general adult medical examination without abnormal findings: Secondary | ICD-10-CM | POA: Diagnosis not present

## 2020-07-23 DIAGNOSIS — L0293 Carbuncle, unspecified: Secondary | ICD-10-CM | POA: Diagnosis not present

## 2020-07-23 DIAGNOSIS — I10 Essential (primary) hypertension: Secondary | ICD-10-CM | POA: Diagnosis not present

## 2020-07-23 DIAGNOSIS — L210 Seborrhea capitis: Secondary | ICD-10-CM | POA: Diagnosis not present

## 2020-07-23 DIAGNOSIS — Z131 Encounter for screening for diabetes mellitus: Secondary | ICD-10-CM | POA: Diagnosis not present

## 2020-07-23 DIAGNOSIS — Z1389 Encounter for screening for other disorder: Secondary | ICD-10-CM | POA: Diagnosis not present

## 2020-07-23 DIAGNOSIS — E669 Obesity, unspecified: Secondary | ICD-10-CM | POA: Diagnosis not present

## 2020-07-23 DIAGNOSIS — Z136 Encounter for screening for cardiovascular disorders: Secondary | ICD-10-CM | POA: Diagnosis not present

## 2020-08-26 DIAGNOSIS — E782 Mixed hyperlipidemia: Secondary | ICD-10-CM | POA: Diagnosis not present

## 2020-08-26 DIAGNOSIS — L0293 Carbuncle, unspecified: Secondary | ICD-10-CM | POA: Diagnosis not present

## 2020-08-26 DIAGNOSIS — L210 Seborrhea capitis: Secondary | ICD-10-CM | POA: Diagnosis not present

## 2020-08-26 DIAGNOSIS — D649 Anemia, unspecified: Secondary | ICD-10-CM | POA: Diagnosis not present

## 2020-08-26 DIAGNOSIS — I10 Essential (primary) hypertension: Secondary | ICD-10-CM | POA: Diagnosis not present

## 2020-08-26 DIAGNOSIS — E669 Obesity, unspecified: Secondary | ICD-10-CM | POA: Diagnosis not present

## 2020-09-05 DIAGNOSIS — L0293 Carbuncle, unspecified: Secondary | ICD-10-CM | POA: Diagnosis not present

## 2020-09-05 DIAGNOSIS — E669 Obesity, unspecified: Secondary | ICD-10-CM | POA: Diagnosis not present

## 2020-09-05 DIAGNOSIS — E782 Mixed hyperlipidemia: Secondary | ICD-10-CM | POA: Diagnosis not present

## 2020-09-05 DIAGNOSIS — D649 Anemia, unspecified: Secondary | ICD-10-CM | POA: Diagnosis not present

## 2020-09-05 DIAGNOSIS — L210 Seborrhea capitis: Secondary | ICD-10-CM | POA: Diagnosis not present

## 2020-09-05 DIAGNOSIS — I10 Essential (primary) hypertension: Secondary | ICD-10-CM | POA: Diagnosis not present

## 2020-09-16 ENCOUNTER — Ambulatory Visit (INDEPENDENT_AMBULATORY_CARE_PROVIDER_SITE_OTHER): Payer: Medicaid Other | Admitting: Obstetrics

## 2020-09-16 ENCOUNTER — Other Ambulatory Visit: Payer: Self-pay

## 2020-09-16 ENCOUNTER — Other Ambulatory Visit (HOSPITAL_COMMUNITY)
Admission: RE | Admit: 2020-09-16 | Discharge: 2020-09-16 | Disposition: A | Discharge status: Home / Self Care | Payer: Medicaid Other | Source: Ambulatory Visit | Attending: Obstetrics | Admitting: Obstetrics

## 2020-09-16 ENCOUNTER — Encounter: Payer: Self-pay | Admitting: Obstetrics

## 2020-09-16 VITALS — BP 145/93 | HR 79 | Ht 69.0 in | Wt 270.9 lb

## 2020-09-16 DIAGNOSIS — Z6841 Body Mass Index (BMI) 40.0 and over, adult: Secondary | ICD-10-CM

## 2020-09-16 DIAGNOSIS — Z113 Encounter for screening for infections with a predominantly sexual mode of transmission: Secondary | ICD-10-CM

## 2020-09-16 DIAGNOSIS — Z01419 Encounter for gynecological examination (general) (routine) without abnormal findings: Secondary | ICD-10-CM | POA: Insufficient documentation

## 2020-09-16 DIAGNOSIS — N898 Other specified noninflammatory disorders of vagina: Secondary | ICD-10-CM

## 2020-09-16 DIAGNOSIS — Z3046 Encounter for surveillance of implantable subdermal contraceptive: Secondary | ICD-10-CM | POA: Diagnosis not present

## 2020-09-16 NOTE — Progress Notes (Signed)
Subjective:        Jody Palmer is a 30 y.o. female here for a routine exam.  Current complaints: Vaginal discharge and itching  Personal health questionnaire:  Is patient Ashkenazi Jewish, have a family history of breast and/or ovarian cancer: no Is there a family history of uterine cancer diagnosed at age < 2, gastrointestinal cancer, urinary tract cancer, family member who is a Field seismologist syndrome-associated carrier: no Is the patient overweight and hypertensive, family history of diabetes, personal history of gestational diabetes, preeclampsia or PCOS: no Is patient over 41, have PCOS,  family history of premature CHD under age 58, diabetes, smoke, have hypertension or peripheral artery disease:  no At any time, has a partner hit, kicked or otherwise hurt or frightened you?: no Over the past 2 weeks, have you felt down, depressed or hopeless?: no Over the past 2 weeks, have you felt little interest or pleasure in doing things?:no   Gynecologic History Patient's last menstrual period was 08/23/2020 (exact date). Contraception: Nexplanon Last Pap: [id number] Results were: normal Last mammogram: n/a  Results were: n/a  Obstetric History OB History  Gravida Para Term Preterm AB Living  '7 2 2   5 2  '$ SAB IAB Ectopic Multiple Live Births  1 4   0 2    # Outcome Date GA Lbr Len/2nd Weight Sex Delivery Anes PTL Lv  7 Term 12/24/17 102w0d05:05 / 00:06 6 lb 9.2 oz (2.982 kg) F Vag-Spont EPI  LIV  6 IAB 2018          5 Term 03/21/14 332w3d9:17 / 00:20 7 lb 12.5 oz (3.53 kg) F Vag-Spont EPI  LIV     Birth Comments: none  Induced for Gestational HTN  4 IAB 2016          3 IAB 2008          2 IAB 2008          1 SAB 2007            Past Medical History:  Diagnosis Date   Asthma    Depression    Gastroschisis    Heart murmur    at birth, no problems since   History of gestational hypertension 06/30/2017   IOL for GHTN 2016   Hypertension    SBO (small bowel obstruction) (HCC)     this was during her first year of life, no problems since   Trichomonas infection     Past Surgical History:  Procedure Laterality Date   ABDOMINAL SURGERY     GASTROSCHISIS CLOSURE  1991   TONSILLECTOMY       Current Outpatient Medications:    acetaminophen (TYLENOL) 325 MG tablet, Take 650-975 mg by mouth every 6 (six) hours as needed for mild pain or headache., Disp: , Rfl:    albuterol (PROVENTIL HFA;VENTOLIN HFA) 108 (90 BASE) MCG/ACT inhaler, Inhale 1 puff into the lungs every 6 (six) hours as needed for wheezing or shortness of breath., Disp: , Rfl:    amLODipine (NORVASC) 5 MG tablet, Take 1 tablet (5 mg total) by mouth daily., Disp: 30 tablet, Rfl: 1   amoxicillin (AMOXIL) 875 MG tablet, Take 1 tablet (875 mg total) by mouth 2 (two) times daily., Disp: 20 tablet, Rfl: 0   cetirizine (ZYRTEC ALLERGY) 10 MG tablet, Take 1 tablet (10 mg total) by mouth daily., Disp: 30 tablet, Rfl: 2   fluticasone (FLONASE) 50 MCG/ACT nasal spray, Place 1 spray into both nostrils  in the morning and at bedtime., Disp: 16 g, Rfl: 2   ibuprofen (ADVIL) 800 MG tablet, Take 1 tablet (800 mg total) by mouth every 8 (eight) hours as needed for headache or moderate pain., Disp: 30 tablet, Rfl: 0   lidocaine (XYLOCAINE) 2 % solution, Use as directed 15 mLs in the mouth or throat as needed for mouth pain., Disp: 100 mL, Rfl: 0   lisinopril (ZESTRIL) 5 MG tablet, Take 1 tablet (5 mg total) by mouth daily., Disp: 30 tablet, Rfl: 3   NIFEdipine (PROCARDIA XL) 30 MG 24 hr tablet, Take 1 tablet (30 mg total) by mouth 2 (two) times daily., Disp: 60 tablet, Rfl: 2   Prenatal Vit-Fe Fumarate-FA (PREPLUS) 27-1 MG TABS, Take 1 tablet by mouth daily. (Patient not taking: Reported on 02/07/2018), Disp: 60 tablet, Rfl: 3   ranitidine (ZANTAC) 150 MG tablet, Take 1 tablet (150 mg total) by mouth 2 (two) times daily. (Patient not taking: Reported on 02/07/2018), Disp: 60 tablet, Rfl: 1 No Known Allergies  Social History    Tobacco Use   Smoking status: Some Days    Types: Cigars    Passive exposure: Never   Smokeless tobacco: Never   Tobacco comments:    rarely smokes black & milds  Substance Use Topics   Alcohol use: Not Currently    Comment: occ    Family History  Problem Relation Age of Onset   Diabetes Father    Hypertension Father    Healthy Mother       Review of Systems  Constitutional: negative for fatigue and weight loss Respiratory: negative for cough and wheezing Cardiovascular: negative for chest pain, fatigue and palpitations Gastrointestinal: negative for abdominal pain and change in bowel habits Musculoskeletal:negative for myalgias Neurological: negative for gait problems and tremors Behavioral/Psych: negative for abusive relationship, depression Endocrine: negative for temperature intolerance    Genitourinary: positive for vaginal discharge and irritation.  negative for abnormal menstrual periods, genital lesions, hot flashes, sexual problems  Integument/breast: negative for breast lump, breast tenderness, nipple discharge and skin lesion(s)    Objective:       BP (!) 145/93 (BP Location: Right Arm, Patient Position: Sitting, Cuff Size: Large)   Pulse 79   Ht '5\' 9"'$  (1.753 m)   Wt 270 lb 14.4 oz (122.9 kg)   LMP 08/23/2020 (Exact Date)   Breastfeeding No   BMI 40.00 kg/m  General:   Alert and no distress  Skin:   no rash or abnormalities  Lungs:   clear to auscultation bilaterally  Heart:   regular rate and rhythm, S1, S2 normal, no murmur, click, rub or gallop  Breasts:   normal without suspicious masses, skin or nipple changes or axillary nodes  Abdomen:  normal findings: no organomegaly, soft, non-tender and no hernia  Pelvis:  External genitalia: normal general appearance Urinary system: urethral meatus normal and bladder without fullness, nontender Vaginal: normal without tenderness, induration or masses Cervix: normal appearance Adnexa: normal bimanual  exam Uterus: anteverted and non-tender, normal size   Lab Review Urine pregnancy test Labs reviewed yes Radiologic studies reviewed yes  I have spent a total of 20 minutes of face-to-face time, excluding clinical staff time, reviewing notes and preparing to see patient, ordering tests and/or medications, and counseling the patient.   Assessment:    1. Encounter for routine gynecological examination with Papanicolaou smear of cervix Rx: - Cytology - PAP( [facility])  2. Vaginal discharge Rx: - Cervicovaginal ancillary only( [facility])  3. Screening examination for STD (sexually transmitted disease) Rx: - RPR+HBsAg+HCVAb+...  4. Vaginal itching  5. Encounter for surveillance of implantable subdermal contraceptive - pleased with Nexplanon  6. Class 3 severe obesity due to excess calories without serious comorbidity with body mass index (BMI) of 40.0 to 44.9 in adult (HCC) - weight loss with the aid of medication, dietary changes, exercise and behavioral modification recommended     Plan:    Education reviewed: calcium supplements, depression evaluation, low fat, low cholesterol diet, safe sex/STD prevention, self breast exams, smoking cessation, and weight bearing exercise. Contraception: Nexplanon. Follow up in: 2 weeks.     Orders Placed This Encounter  Procedures   RPR+HBsAg+HCVAb+...    Shelly Bombard, MD 09/16/2020 11:06 AM

## 2020-09-17 ENCOUNTER — Other Ambulatory Visit: Payer: Self-pay

## 2020-09-17 ENCOUNTER — Telehealth: Payer: Self-pay

## 2020-09-17 DIAGNOSIS — A599 Trichomoniasis, unspecified: Secondary | ICD-10-CM

## 2020-09-17 LAB — CERVICOVAGINAL ANCILLARY ONLY
Bacterial Vaginitis (gardnerella): POSITIVE — AB
Candida Glabrata: NEGATIVE
Candida Vaginitis: NEGATIVE
Chlamydia: NEGATIVE
Comment: NEGATIVE
Comment: NEGATIVE
Comment: NEGATIVE
Comment: NEGATIVE
Comment: NEGATIVE
Comment: NORMAL
Neisseria Gonorrhea: NEGATIVE
Trichomonas: POSITIVE — AB

## 2020-09-17 LAB — RPR+HBSAG+HCVAB+...
HIV Screen 4th Generation wRfx: NONREACTIVE
Hep C Virus Ab: 0.2 s/co ratio (ref 0.0–0.9)
Hepatitis B Surface Ag: NEGATIVE
RPR Ser Ql: NONREACTIVE

## 2020-09-17 MED ORDER — METRONIDAZOLE 500 MG PO TABS: 500.0000 mg | ORAL_TABLET | Freq: Two times a day (BID) | ORAL | 0 refills | Status: DC

## 2020-09-17 NOTE — Telephone Encounter (Signed)
Return call to pt regarding vm Pt made provider has not seen results from yesterday just yet and I will consult with them on treatment medication.  Pt made aware to refrain from unprotected intercourse w/ partner and make TOC appt in 4-6 wks Pt voiced understanding.

## 2020-09-18 ENCOUNTER — Other Ambulatory Visit: Payer: Self-pay | Admitting: Obstetrics

## 2020-09-18 DIAGNOSIS — A5901 Trichomonal vulvovaginitis: Secondary | ICD-10-CM

## 2020-09-18 DIAGNOSIS — B9689 Other specified bacterial agents as the cause of diseases classified elsewhere: Secondary | ICD-10-CM

## 2020-09-18 DIAGNOSIS — N760 Acute vaginitis: Secondary | ICD-10-CM

## 2020-09-18 MED ORDER — METRONIDAZOLE 500 MG PO TABS
500.0000 mg | ORAL_TABLET | Freq: Two times a day (BID) | ORAL | 2 refills | Status: DC
Start: 2020-09-18 — End: 2021-01-26

## 2020-09-19 ENCOUNTER — Other Ambulatory Visit: Payer: Self-pay | Admitting: Obstetrics

## 2020-09-19 LAB — CYTOLOGY - PAP
Comment: NEGATIVE
Diagnosis: NEGATIVE
High risk HPV: NEGATIVE

## 2020-09-25 ENCOUNTER — Ambulatory Visit (INDEPENDENT_AMBULATORY_CARE_PROVIDER_SITE_OTHER): Payer: Medicaid Other | Admitting: Primary Care

## 2020-11-18 DIAGNOSIS — D649 Anemia, unspecified: Secondary | ICD-10-CM | POA: Diagnosis not present

## 2020-11-18 DIAGNOSIS — E782 Mixed hyperlipidemia: Secondary | ICD-10-CM | POA: Diagnosis not present

## 2020-11-18 DIAGNOSIS — E669 Obesity, unspecified: Secondary | ICD-10-CM | POA: Diagnosis not present

## 2020-11-18 DIAGNOSIS — I10 Essential (primary) hypertension: Secondary | ICD-10-CM | POA: Diagnosis not present

## 2020-11-18 DIAGNOSIS — L210 Seborrhea capitis: Secondary | ICD-10-CM | POA: Diagnosis not present

## 2020-11-18 DIAGNOSIS — R0602 Shortness of breath: Secondary | ICD-10-CM | POA: Diagnosis not present

## 2020-11-18 DIAGNOSIS — L0293 Carbuncle, unspecified: Secondary | ICD-10-CM | POA: Diagnosis not present

## 2020-12-10 DIAGNOSIS — D649 Anemia, unspecified: Secondary | ICD-10-CM | POA: Diagnosis not present

## 2020-12-10 DIAGNOSIS — E782 Mixed hyperlipidemia: Secondary | ICD-10-CM | POA: Diagnosis not present

## 2020-12-10 DIAGNOSIS — E669 Obesity, unspecified: Secondary | ICD-10-CM | POA: Diagnosis not present

## 2020-12-10 DIAGNOSIS — L0293 Carbuncle, unspecified: Secondary | ICD-10-CM | POA: Diagnosis not present

## 2020-12-10 DIAGNOSIS — I10 Essential (primary) hypertension: Secondary | ICD-10-CM | POA: Diagnosis not present

## 2020-12-10 DIAGNOSIS — L210 Seborrhea capitis: Secondary | ICD-10-CM | POA: Diagnosis not present

## 2020-12-15 ENCOUNTER — Other Ambulatory Visit (HOSPITAL_COMMUNITY)
Admission: RE | Admit: 2020-12-15 | Discharge: 2020-12-15 | Disposition: A | Discharge status: Home / Self Care | Payer: Medicaid Other | Source: Ambulatory Visit | Attending: Obstetrics and Gynecology | Admitting: Obstetrics and Gynecology

## 2020-12-15 ENCOUNTER — Ambulatory Visit (INDEPENDENT_AMBULATORY_CARE_PROVIDER_SITE_OTHER): Payer: Medicaid Other

## 2020-12-15 ENCOUNTER — Other Ambulatory Visit: Payer: Self-pay

## 2020-12-15 VITALS — BP 158/100 | HR 80

## 2020-12-15 DIAGNOSIS — N898 Other specified noninflammatory disorders of vagina: Secondary | ICD-10-CM | POA: Diagnosis not present

## 2020-12-15 DIAGNOSIS — Z113 Encounter for screening for infections with a predominantly sexual mode of transmission: Secondary | ICD-10-CM | POA: Diagnosis not present

## 2020-12-15 NOTE — Progress Notes (Signed)
SUBJECTIVE:  30 y.o. female complains of white vaginal discharge for couple days. Denies abnormal vaginal bleeding or significant pelvic pain or fever. No UTI symptoms. Hx of Trich.  No LMP recorded. Patient has had an implant.  OBJECTIVE:  She appears well, afebrile. Urine dipstick: not done.  ASSESSMENT:  Vaginal Discharge     PLAN:  GC, chlamydia, trichomonas, BVAG, CVAG probe sent to lab. Treatment: To be determined once lab results are received ROV prn if symptoms persist or worsen.

## 2020-12-15 NOTE — Progress Notes (Signed)
Agree with nurses's documentation of this patient's clinic encounter.  Supriya Beaston L, MD  

## 2020-12-16 LAB — CERVICOVAGINAL ANCILLARY ONLY
Bacterial Vaginitis (gardnerella): POSITIVE — AB
Candida Glabrata: NEGATIVE
Candida Vaginitis: NEGATIVE
Chlamydia: NEGATIVE
Comment: NEGATIVE
Comment: NEGATIVE
Comment: NEGATIVE
Comment: NEGATIVE
Comment: NEGATIVE
Comment: NORMAL
Neisseria Gonorrhea: NEGATIVE
Trichomonas: NEGATIVE

## 2020-12-17 ENCOUNTER — Other Ambulatory Visit: Payer: Self-pay | Admitting: *Deleted

## 2020-12-17 ENCOUNTER — Encounter: Payer: Self-pay | Admitting: *Deleted

## 2020-12-17 MED ORDER — METRONIDAZOLE 500 MG PO TABS: 500.0000 mg | ORAL_TABLET | Freq: Two times a day (BID) | ORAL | 0 refills | Status: DC

## 2020-12-17 NOTE — Progress Notes (Signed)
RX sent. MyChart message with patient education on BV included.

## 2021-01-26 ENCOUNTER — Ambulatory Visit (HOSPITAL_COMMUNITY)
Admission: EM | Admit: 2021-01-26 | Discharge: 2021-01-26 | Disposition: A | Discharge status: Home / Self Care | Payer: Medicaid Other | Attending: Family Medicine | Admitting: Family Medicine

## 2021-01-26 ENCOUNTER — Encounter (HOSPITAL_COMMUNITY): Payer: Self-pay

## 2021-01-26 ENCOUNTER — Other Ambulatory Visit: Payer: Self-pay

## 2021-01-26 ENCOUNTER — Ambulatory Visit (INDEPENDENT_AMBULATORY_CARE_PROVIDER_SITE_OTHER): Payer: Medicaid Other

## 2021-01-26 DIAGNOSIS — R509 Fever, unspecified: Secondary | ICD-10-CM

## 2021-01-26 DIAGNOSIS — J4521 Mild intermittent asthma with (acute) exacerbation: Secondary | ICD-10-CM | POA: Diagnosis not present

## 2021-01-26 DIAGNOSIS — R059 Cough, unspecified: Secondary | ICD-10-CM | POA: Diagnosis not present

## 2021-01-26 DIAGNOSIS — R051 Acute cough: Secondary | ICD-10-CM

## 2021-01-26 MED ORDER — ALBUTEROL SULFATE HFA 108 (90 BASE) MCG/ACT IN AERS: 1.0000 | INHALATION_SPRAY | Freq: Four times a day (QID) | RESPIRATORY_TRACT | 2 refills | Status: DC | PRN

## 2021-01-26 MED ORDER — PREDNISONE 20 MG PO TABS: 40.0000 mg | ORAL_TABLET | Freq: Every day | ORAL | 0 refills | Status: DC

## 2021-01-26 MED ORDER — PROMETHAZINE-DM 6.25-15 MG/5ML PO SYRP: 5.0000 mL | ORAL_SOLUTION | Freq: Four times a day (QID) | ORAL | 0 refills | Status: DC | PRN

## 2021-01-26 NOTE — ED Triage Notes (Signed)
Pt presents with non productive cough, chills, generalized body aches, and chest tightness for over a week; pt daughter recently had flu.

## 2021-01-28 NOTE — ED Provider Notes (Signed)
Jody Palmer   [id number] 01/26/21 Arrival Time: 1963  ASSESSMENT & PLAN:  1. Fever, unspecified fever cause   2. Acute cough   3. Mild intermittent asthma with acute exacerbation    I have personally viewed the imaging studies ordered this visit. No signs of PNA on CXR.  Begin: Meds ordered this encounter  Medications   albuterol (VENTOLIN HFA) 108 (90 Base) MCG/ACT inhaler    Sig: Inhale 1-2 puffs into the lungs every 6 (six) hours as needed for wheezing or shortness of breath.    Dispense:  1 each    Refill:  2   predniSONE (DELTASONE) 20 MG tablet    Sig: Take 2 tablets (40 mg total) by mouth daily.    Dispense:  10 tablet    Refill:  0   promethazine-dextromethorphan (PROMETHAZINE-DM) 6.25-15 MG/5ML syrup    Sig: Take 5 mLs by mouth 4 (four) times daily as needed for cough.    Dispense:  118 mL    Refill:  0    Asthma precautions given. OTC symptom care as needed.  Recommend:  Follow-up Information     [facility] .   Specialty: Urgent Care Why: As needed. Contact information: [place] [address] [phone]                Reviewed expectations re: course of current medical issues. Questions answered. Outlined signs and symptoms indicating need for more acute intervention. Patient verbalized understanding. After Visit Summary given.  SUBJECTIVE: History from: patient.  Jody Palmer is a 30 y.o. female who presents with complaint of intermittent chest tightness and wheezing. Onset gradual,  over past week; with body aches and prod coughing; occas SOB . Describes wheezing as moderate when present. Fever: no. Overall normal PO intake without n/v. Sick contacts: no. Ambulatory without difficulty. No LE edema. Typically her asthma is well controlled. Inhaler use: daily for past sev days. OTC treatment: none.  Social History   Tobacco Use  Smoking Status Some Days   Types:  Cigars   Passive exposure: Never  Smokeless Tobacco Never  Tobacco Comments   rarely smokes black & milds     OBJECTIVE:  Vitals:   01/26/21 0940  BP: (!) 155/103  Pulse: 84  Temp: 98 F (36.7 C)  TempSrc: Oral  SpO2: 98%     General appearance: alert; NAD HEENT: [place]; AT; with mild nasal congestion Neck: supple without LAD Cv: RRR without murmer Lungs: unlabored respirations, moderate bilateral expiratory wheezing; cough: mild; no significant respiratory distress Skin: warm and dry Psychological: alert and cooperative; normal mood and affect  Imaging: DG Chest 2 View  Result Date: 01/26/2021 CLINICAL DATA:  Fever, cough. EXAM: CHEST - 2 VIEW COMPARISON:  None. FINDINGS: The heart size and mediastinal contours are within normal limits. Both lungs are clear. The visualized skeletal structures are unremarkable. IMPRESSION: No active cardiopulmonary disease. Electronically Signed   By: Marijo Conception M.D.   On: 01/26/2021 10:47    No Known Allergies  Past Medical History:  Diagnosis Date   Asthma    Depression    Gastroschisis    Heart murmur    at birth, no problems since   History of gestational hypertension 06/30/2017   IOL for GHTN 2016   Hypertension    SBO (small bowel obstruction) (HCC)    this was during her first year of life, no problems since   Trichomonas infection  Family History  Problem Relation Age of Onset   Diabetes Father    Hypertension Father    Healthy Mother    Social History   Socioeconomic History   Marital status: Single    Spouse name: Not on file   Number of children: Not on file   Years of education: Not on file   Highest education level: Not on file  Occupational History   Not on file  Tobacco Use   Smoking status: Some Days    Types: Cigars    Passive exposure: Never   Smokeless tobacco: Never   Tobacco comments:    rarely smokes black & milds  Vaping Use   Vaping Use: Never used  Substance and Sexual Activity    Alcohol use: Not Currently    Comment: occ   Drug use: Yes    Types: Marijuana    Comment: last use May 2015   Sexual activity: Yes    Birth control/protection: Implant  Other Topics Concern   Not on file  Social History Narrative   Not on file   Social Determinants of Health   Financial Resource Strain: Not on file  Food Insecurity: Not on file  Transportation Needs: Not on file  Physical Activity: Not on file  Stress: Not on file  Social Connections: Not on file  Intimate Partner Violence: Not on file             Robbins, MD 01/28/21 [phone]

## 2021-03-10 ENCOUNTER — Other Ambulatory Visit: Payer: Self-pay

## 2021-03-10 ENCOUNTER — Encounter: Payer: Self-pay | Admitting: Obstetrics

## 2021-03-10 ENCOUNTER — Ambulatory Visit: Payer: Medicaid Other | Admitting: Obstetrics

## 2021-03-10 VITALS — BP 139/105 | HR 84 | Wt 266.0 lb

## 2021-03-10 DIAGNOSIS — Z3046 Encounter for surveillance of implantable subdermal contraceptive: Secondary | ICD-10-CM | POA: Diagnosis not present

## 2021-03-10 DIAGNOSIS — Z30017 Encounter for initial prescription of implantable subdermal contraceptive: Secondary | ICD-10-CM | POA: Diagnosis not present

## 2021-03-10 DIAGNOSIS — L089 Local infection of the skin and subcutaneous tissue, unspecified: Secondary | ICD-10-CM

## 2021-03-10 MED ORDER — ETONOGESTREL 68 MG ~~LOC~~ IMPL
68.0000 mg | DRUG_IMPLANT | Freq: Once | SUBCUTANEOUS | Status: AC
  Administered 2021-03-10: 68 mg via SUBCUTANEOUS

## 2021-03-10 MED ORDER — AMOXICILLIN-POT CLAVULANATE 875-125 MG PO TABS: 1.0000 | ORAL_TABLET | Freq: Two times a day (BID) | ORAL | 0 refills | Status: DC

## 2021-03-10 NOTE — Progress Notes (Signed)
Nexplanon Procedure Note    PROCEDURE: Nexplanon Removal and Reinsertion Performing Provider: Shelly Bombard MD  Patient education prior to procedure, explained risk, benefits of Nexplanon, reviewed alternative options. Patient reported understanding. Gave consent to continue with procedure.  Patient had Nexplanon inserted in 2019. Desires removal today w/ reinsertion  PROCEDURE:  Pregnancy Text :  not indicated Site (check):      left arm         Sterile Preparation:   Betadinex3 Lot # [id number] Expiration Date 2024OCT24    The patients left arm was palpated and the implant device located. The area was prepped with Betadinex3. The distal end of the device was palpated and 1.5 cc of 1% lidocaine without epinephrine was injected. A 1.5 mm incision was made. Any fibrotic tissue was carefully dissected away using blunt and/or sharp dissection. The device was removed in an intact manner.   Insertion site was the same as the removal site. Nexplanon  was inserted subcutaneously.Needle was removed from the insertion site. Nexplanon capsule was palpated by provider and patient to assure satisfactory placement. Dressing applied.  Followup: The patient tolerated the procedure well without complications.  Standard post-procedure care is explained and return precautions are given.  Shelly Bombard, MD, [place] Attending [place], [facility] for [facility], [place], [place] 03/10/2021

## 2021-03-12 DIAGNOSIS — E669 Obesity, unspecified: Secondary | ICD-10-CM | POA: Diagnosis not present

## 2021-03-12 DIAGNOSIS — E782 Mixed hyperlipidemia: Secondary | ICD-10-CM | POA: Diagnosis not present

## 2021-03-12 DIAGNOSIS — I10 Essential (primary) hypertension: Secondary | ICD-10-CM | POA: Diagnosis not present

## 2021-03-12 DIAGNOSIS — J4520 Mild intermittent asthma, uncomplicated: Secondary | ICD-10-CM | POA: Diagnosis not present

## 2021-03-12 DIAGNOSIS — D500 Iron deficiency anemia secondary to blood loss (chronic): Secondary | ICD-10-CM | POA: Diagnosis not present

## 2021-03-24 ENCOUNTER — Other Ambulatory Visit: Payer: Self-pay

## 2021-03-24 ENCOUNTER — Ambulatory Visit: Payer: Medicaid Other | Admitting: Obstetrics

## 2021-03-24 ENCOUNTER — Encounter: Payer: Self-pay | Admitting: Obstetrics

## 2021-03-24 VITALS — BP 144/93 | HR 85 | Ht 69.0 in | Wt 269.6 lb

## 2021-03-24 DIAGNOSIS — E669 Obesity, unspecified: Secondary | ICD-10-CM

## 2021-03-24 DIAGNOSIS — Z3046 Encounter for surveillance of implantable subdermal contraceptive: Secondary | ICD-10-CM

## 2021-03-24 DIAGNOSIS — Z9889 Other specified postprocedural states: Secondary | ICD-10-CM

## 2021-03-24 DIAGNOSIS — F17200 Nicotine dependence, unspecified, uncomplicated: Secondary | ICD-10-CM

## 2021-03-24 NOTE — Progress Notes (Signed)
Subjective:    Jody Palmer is a 31 y.o. female who presents for 2 week follow up after Nexplanon Removal / Re-insertion. The patient has no complaints today. The patient is sexually active. Pertinent past medical history: current smoker.  The information documented in the HPI was reviewed and verified.  Menstrual History: OB History     Gravida  7   Para  2   Term  2   Preterm      AB  5   Living  2      SAB  1   IAB  4   Ectopic      Multiple  0   Live Births  2            No LMP recorded. Patient has had an implant.   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   History of shoulder dystocia in prior pregnancy 06/30/2017   Supervision of high risk pregnancy, antepartum 06/30/2017   Marijuana abuse 06/30/2017   History of major abdominal surgery 06/30/2017   Past Medical History:  Diagnosis Date   Asthma    Depression    Gastroschisis    Heart murmur    at birth, no problems since   History of gestational hypertension 06/30/2017   IOL for GHTN 2016   Hypertension    SBO (small bowel obstruction) (HCC)    this was during her first year of life, no problems since   Trichomonas infection     Past Surgical History:  Procedure Laterality Date   ABDOMINAL SURGERY     GASTROSCHISIS CLOSURE  1991   TONSILLECTOMY       Current Outpatient Medications:    albuterol (VENTOLIN HFA) 108 (90 Base) MCG/ACT inhaler, Inhale 1-2 puffs into the lungs every 6 (six) hours as needed for wheezing or shortness of breath., Disp: 1 each, Rfl: 2   amLODipine (NORVASC) 5 MG tablet, Take 1 tablet (5 mg total) by mouth daily., Disp: 30 tablet, Rfl: 1   amoxicillin-clavulanate (AUGMENTIN) 875-125 MG tablet, Take 1 tablet by mouth 2 (two) times daily., Disp: 14 tablet, Rfl: 0   cetirizine (ZYRTEC ALLERGY) 10 MG tablet,  Take 1 tablet (10 mg total) by mouth daily. (Patient not taking: Reported on 03/10/2021), Disp: 30 tablet, Rfl: 2   fluticasone (FLONASE) 50 MCG/ACT nasal spray, Place 1 spray into both nostrils in the morning and at bedtime. (Patient not taking: Reported on 03/24/2021), Disp: 16 g, Rfl: 2   ibuprofen (ADVIL) 800 MG tablet, Take 1 tablet (800 mg total) by mouth every 8 (eight) hours as needed for headache or moderate pain. (Patient not taking: Reported on 03/24/2021), Disp: 30 tablet, Rfl: 0   lidocaine (XYLOCAINE) 2 % solution, Use as directed 15 mLs in the mouth or throat as needed for mouth pain. (Patient not taking: Reported on 03/10/2021), Disp: 100 mL, Rfl: 0   lisinopril (ZESTRIL) 5 MG tablet, Take 1 tablet (5 mg total) by mouth daily., Disp: 30 tablet, Rfl: 3   predniSONE (DELTASONE) 20 MG tablet, Take 2 tablets (40 mg total) by mouth daily. (Patient not taking: Reported on 03/24/2021), Disp: 10 tablet, Rfl: 0   Prenatal Vit-Fe Fumarate-FA (PREPLUS) 27-1 MG TABS, Take 1 tablet by mouth daily. (Patient not taking: Reported on 02/07/2018), Disp: 60 tablet, Rfl: 3   promethazine-dextromethorphan (PROMETHAZINE-DM) 6.25-15 MG/5ML syrup, Take 5 mLs  by mouth 4 (four) times daily as needed for cough. (Patient not taking: Reported on 03/24/2021), Disp: 118 mL, Rfl: 0   ranitidine (ZANTAC) 150 MG tablet, Take 1 tablet (150 mg total) by mouth 2 (two) times daily. (Patient not taking: Reported on 02/07/2018), Disp: 60 tablet, Rfl: 1 No Known Allergies  Social History   Tobacco Use   Smoking status: Some Days    Types: Cigars    Passive exposure: Never   Smokeless tobacco: Never   Tobacco comments:    rarely smokes black & milds  Substance Use Topics   Alcohol use: Not Currently    Comment: occ    Family History  Problem Relation Age of Onset   Diabetes Father    Hypertension Father    Healthy Mother        Review of Systems Constitutional: negative for weight loss Genitourinary:negative for  abnormal menstrual periods and vaginal discharge   Objective:   BP (!) 144/93    Pulse 85    Ht 5\' 9"  (1.753 m)    Wt 269 lb 9.6 oz (122.3 kg)    BMI 39.81 kg/m    General:   Alert and no distress  Skin:   no rash or abnormalities  Lungs:   clear to auscultation bilaterally  Heart:   regular rate and rhythm, S1, S2 normal, no murmur, click, rub or gallop  Left Upper Extremity: Nexplanon removal / reinsertion site is clean, dry and intact.  Rod palpated, non tender.  Lab Review Urine pregnancy test Labs reviewed yes Radiologic studies reviewed yes  I have spent a total of 20 minutes of face-to-face time, excluding clinical staff time, reviewing notes and preparing to see patient, ordering tests and/or medications, and counseling the patient.   Assessment:    31 y.o., continuing Nexplanon, no contraindications.   Plan:   1. Postoperative state - doing well  2. Encounter for surveillance of Nexplanon subdermal contraceptive  3. Tobacco dependence - smoking cessation recommended  4. Obesity (BMI 35.0-39.9 without comorbidity) - weight reduction with the aid of dietary changes, exercise and behavioral modification recommended    All questions answered. Contraception: Nexplanon. Discussed healthy lifestyle modifications. Follow up in 6 months.   Shelly Bombard, MD 03/24/2021 11:08 AM

## 2021-03-24 NOTE — Progress Notes (Signed)
Pt is in the office for nexplanon follow up after removal/re-insertion on 03/10/2021 Pt denies any complications

## 2021-07-08 ENCOUNTER — Other Ambulatory Visit (HOSPITAL_COMMUNITY)
Admission: RE | Admit: 2021-07-08 | Discharge: 2021-07-08 | Disposition: A | Discharge status: Home / Self Care | Payer: Medicaid Other | Source: Ambulatory Visit | Attending: Obstetrics & Gynecology | Admitting: Obstetrics & Gynecology

## 2021-07-08 ENCOUNTER — Ambulatory Visit (INDEPENDENT_AMBULATORY_CARE_PROVIDER_SITE_OTHER): Payer: Medicaid Other

## 2021-07-08 DIAGNOSIS — N898 Other specified noninflammatory disorders of vagina: Secondary | ICD-10-CM | POA: Insufficient documentation

## 2021-07-08 NOTE — Progress Notes (Signed)
..  SUBJECTIVE:  31 y.o. female complains of white vaginal discharge for 1 week(s). Denies abnormal vaginal bleeding or significant pelvic pain or fever. No UTI symptoms. Denies history of known exposure to STD. Reports new partner  No LMP recorded. Patient has had an implant.  OBJECTIVE:  She appears well, afebrile. Urine dipstick: not done.  ASSESSMENT:  Vaginal Discharge  Vaginal Irritation   PLAN:  GC, chlamydia, trichomonas, BVAG, CVAG probe sent to lab. Treatment: To be determined once lab results are received ROV prn if symptoms persist or worsen.

## 2021-07-09 LAB — CERVICOVAGINAL ANCILLARY ONLY
Bacterial Vaginitis (gardnerella): POSITIVE — AB
Candida Glabrata: NEGATIVE
Candida Vaginitis: NEGATIVE
Chlamydia: NEGATIVE
Comment: NEGATIVE
Comment: NEGATIVE
Comment: NEGATIVE
Comment: NEGATIVE
Comment: NEGATIVE
Comment: NORMAL
Neisseria Gonorrhea: NEGATIVE
Trichomonas: NEGATIVE

## 2021-07-22 ENCOUNTER — Other Ambulatory Visit: Payer: Self-pay | Admitting: Obstetrics & Gynecology

## 2021-07-22 DIAGNOSIS — N898 Other specified noninflammatory disorders of vagina: Secondary | ICD-10-CM

## 2021-07-22 MED ORDER — METRONIDAZOLE 500 MG PO TABS: 500.0000 mg | ORAL_TABLET | Freq: Two times a day (BID) | ORAL | 0 refills | Status: DC

## 2021-07-22 NOTE — Progress Notes (Signed)
lagyl

## 2021-10-21 ENCOUNTER — Other Ambulatory Visit (HOSPITAL_COMMUNITY)
Admission: RE | Admit: 2021-10-21 | Discharge: 2021-10-21 | Disposition: A | Discharge status: Home / Self Care | Payer: Medicaid Other | Source: Ambulatory Visit | Attending: Obstetrics and Gynecology | Admitting: Obstetrics and Gynecology

## 2021-10-21 ENCOUNTER — Ambulatory Visit (INDEPENDENT_AMBULATORY_CARE_PROVIDER_SITE_OTHER): Payer: Medicaid Other | Admitting: Emergency Medicine

## 2021-10-21 VITALS — BP 162/114 | HR 81 | Ht 68.0 in | Wt 273.8 lb

## 2021-10-21 DIAGNOSIS — N898 Other specified noninflammatory disorders of vagina: Secondary | ICD-10-CM

## 2021-10-21 NOTE — Progress Notes (Signed)
SUBJECTIVE:  31 y.o. female complains of clear vaginal discharge for 4 day(s). Denies abnormal vaginal bleeding or significant pelvic pain or fever. No UTI symptoms. Denies history of known exposure to STD.  No LMP recorded. Patient has had an implant.  OBJECTIVE:  She appears well, afebrile. Urine dipstick: not done.  ASSESSMENT:  Vaginal Discharge  Vaginal Odor   PLAN:  GC, chlamydia, trichomonas, BVAG, CVAG probe sent to lab. Treatment: To be determined once lab results are received ROV prn if symptoms persist or worsen.

## 2021-10-22 ENCOUNTER — Other Ambulatory Visit: Payer: Self-pay

## 2021-10-22 DIAGNOSIS — B9689 Other specified bacterial agents as the cause of diseases classified elsewhere: Secondary | ICD-10-CM

## 2021-10-22 LAB — CERVICOVAGINAL ANCILLARY ONLY
Bacterial Vaginitis (gardnerella): POSITIVE — AB
Candida Glabrata: NEGATIVE
Candida Vaginitis: NEGATIVE
Chlamydia: NEGATIVE
Comment: NEGATIVE
Comment: NEGATIVE
Comment: NEGATIVE
Comment: NEGATIVE
Comment: NEGATIVE
Comment: NORMAL
Neisseria Gonorrhea: NEGATIVE
Trichomonas: NEGATIVE

## 2021-10-22 MED ORDER — METRONIDAZOLE 500 MG PO TABS: 500.0000 mg | ORAL_TABLET | Freq: Two times a day (BID) | ORAL | 0 refills | Status: DC

## 2021-10-27 ENCOUNTER — Ambulatory Visit
Admission: EM | Admit: 2021-10-27 | Discharge: 2021-10-27 | Disposition: A | Discharge status: Home / Self Care | Payer: Medicaid Other | Attending: Internal Medicine | Admitting: Internal Medicine

## 2021-10-27 DIAGNOSIS — J069 Acute upper respiratory infection, unspecified: Secondary | ICD-10-CM | POA: Diagnosis not present

## 2021-10-27 DIAGNOSIS — Z20822 Contact with and (suspected) exposure to covid-19: Secondary | ICD-10-CM | POA: Diagnosis not present

## 2021-10-27 LAB — RESP PANEL BY RT-PCR (FLU A&B, COVID) ARPGX2
Influenza A by PCR: NEGATIVE
Influenza B by PCR: NEGATIVE
SARS Coronavirus 2 by RT PCR: NEGATIVE

## 2021-10-27 MED ORDER — PREDNISONE 20 MG PO TABS: 40.0000 mg | ORAL_TABLET | Freq: Every day | ORAL | 0 refills | Status: AC

## 2021-10-27 MED ORDER — ALBUTEROL SULFATE HFA 108 (90 BASE) MCG/ACT IN AERS
1.0000 | INHALATION_SPRAY | Freq: Four times a day (QID) | RESPIRATORY_TRACT | 0 refills | Status: DC | PRN
Start: 2021-10-27 — End: 2021-11-16

## 2021-10-27 NOTE — Discharge Instructions (Signed)
It appears that you have a viral upper respiratory infection that is exacerbating your asthma.  You have been prescribed albuterol inhaler and prednisone steroid to help alleviate symptoms.  COVID and flu test are pending.  We will call if it is positive.

## 2021-10-27 NOTE — ED Triage Notes (Signed)
Pt. States she has been experiencing a cough, headache, nasal congestion and chest pain. Pt. Expresses tightness in chest with coughing and it causes SOB.

## 2021-10-27 NOTE — ED Provider Notes (Signed)
[facility]    CSN: [id number] Arrival date & time: 10/27/21  1229      History   Chief Complaint Chief Complaint  Patient presents with   Headache   Nasal Congestion   Cough   Chest Pain    HPI J[facility] is a 31 y.o. female.   Patient reports cough, headache, nasal congestion, chest tightness that started about 5 to 6 days ago.  Patient reports that she has some chest tightness and shortness of breath with associated cough.  Denies any associated chest pain.  Patient does report that she has history of asthma but only has flareups when she has an acute illness.  Does not have albuterol inhaler currently.  Denies any known fevers.  Patient reports that her daughter had similar symptoms.  Denies sore throat, ear pain, nausea, vomiting, diarrhea, abdominal pain.  Patient does not report taking any medications to alleviate symptoms.   Headache Cough Chest Pain   Past Medical History:  Diagnosis Date   Asthma    Depression    Gastroschisis    Heart murmur    at birth, no problems since   History of gestational hypertension 06/30/2017   IOL for GHTN 2016   Hypertension    SBO (small bowel obstruction) ([place])    this was during her first year of life, no problems since   Trichomonas infection     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   History of shoulder dystocia in prior pregnancy 06/30/2017   Supervision of high risk pregnancy, antepartum 06/30/2017   Marijuana abuse 06/30/2017   History of major abdominal surgery 06/30/2017    Past Surgical History:  Procedure Laterality Date   ABDOMINAL SURGERY     GASTROSCHISIS CLOSURE  1991   TONSILLECTOMY      OB History     Gravida  7   Para  2   Term  2   Preterm      AB  5   Living  2      SAB  1   IAB  4   Ectopic       Multiple  0   Live Births  2            Home Medications    Prior to Admission medications   Medication Sig Start Date End Date Taking? Authorizing Provider  albuterol (VENTOLIN HFA) 108 (90 Base) MCG/ACT inhaler Inhale 1-2 puffs into the lungs every 6 (six) hours as needed for wheezing or shortness of breath. 10/27/21  Yes Alba Kriesel, Hildred Alamin E, FNP  predniSONE (DELTASONE) 20 MG tablet Take 2 tablets (40 mg total) by mouth daily for 5 days. 10/27/21 11/01/21 Yes Jaya Lapka, Michele Rockers, FNP  amLODipine (NORVASC) 5 MG tablet Take 1 tablet (5 mg total) by mouth daily. Patient not taking: Reported on 07/08/2021 11/13/18   Rosemarie Ax, MD  amoxicillin-clavulanate (AUGMENTIN) 875-125 MG tablet Take 1 tablet by mouth 2 (two) times daily. Patient not taking: Reported on 07/08/2021 03/10/21   Shelly Bombard, MD  atenolol-chlorthalidone (TENORETIC) 50-25 MG tablet Take 1 tablet by mouth daily.    [provider]  cetirizine (ZYRTEC ALLERGY) 10 MG tablet Take 1 tablet (10 mg total) by mouth daily. Patient not taking: Reported on 03/10/2021 06/09/20   Volney American, PA-C  fluticasone [facility]) 50 MCG/ACT nasal spray  Place 1 spray into both nostrils in the morning and at bedtime. Patient not taking: Reported on 03/24/2021 06/09/20   Volney American, PA-C  ibuprofen (ADVIL) 800 MG tablet Take 1 tablet (800 mg total) by mouth every 8 (eight) hours as needed for headache or moderate pain. Patient not taking: Reported on 03/24/2021 06/30/20   Hans Eden, NP  lidocaine (XYLOCAINE) 2 % solution Use as directed 15 mLs in the mouth or throat as needed for mouth pain. Patient not taking: Reported on 03/10/2021 06/30/20   Hans Eden, NP  lisinopril (ZESTRIL) 5 MG tablet Take 1 tablet (5 mg total) by mouth daily. Patient not taking: Reported on 07/08/2021 03/13/19   Marcille Buffy D, CNM  metroNIDAZOLE (FLAGYL) 500 MG tablet Take 1 tablet (500 mg total) by mouth 2 (two) times daily. 07/22/21    Woodroe Mode, MD  metroNIDAZOLE (FLAGYL) 500 MG tablet Take 1 tablet (500 mg total) by mouth 2 (two) times daily. 10/22/21   Chancy Milroy, MD  Prenatal Vit-Fe Fumarate-FA (PREPLUS) 27-1 MG TABS Take 1 tablet by mouth daily. Patient not taking: Reported on 02/07/2018 06/30/17   Aletha Halim, MD  promethazine-dextromethorphan (PROMETHAZINE-DM) 6.25-15 MG/5ML syrup Take 5 mLs by mouth 4 (four) times daily as needed for cough. Patient not taking: Reported on 03/24/2021 01/26/21   Vanessa Kick, MD  ranitidine (ZANTAC) 150 MG tablet Take 1 tablet (150 mg total) by mouth 2 (two) times daily. Patient not taking: Reported on 02/07/2018 08/19/17   Virginia Rochester, NP    Family History Family History  Problem Relation Age of Onset   Diabetes Father    Hypertension Father    Healthy Mother     Social History Social History   Tobacco Use   Smoking status: Some Days    Types: Cigars    Passive exposure: Never   Smokeless tobacco: Never   Tobacco comments:    rarely smokes black & milds  Vaping Use   Vaping Use: Never used  Substance Use Topics   Alcohol use: Not Currently    Comment: occ   Drug use: Yes    Types: Marijuana    Comment: last use May 2015     Allergies   Patient has no known allergies.   Review of Systems Review of Systems Per HPI  Physical Exam Triage Vital Signs ED Triage Vitals  Enc Vitals Group     BP 10/27/21 1358 (!) 156/82     Pulse Rate 10/27/21 1358 83     Resp 10/27/21 1358 18     Temp 10/27/21 1358 97.8 F (36.6 C)     Temp src --      SpO2 10/27/21 1358 97 %     Weight --      Height --      Head Circumference --      Peak Flow --      Pain Score 10/27/21 1400 8     Pain Loc --      Pain Edu? --      Excl. in [place]? --    No data found.  Updated Vital Signs BP (!) 156/82   Pulse 83   Temp 97.8 F (36.6 C)   Resp 18   LMP 10/11/2021   SpO2 97%   Visual Acuity Right Eye Distance:   Left Eye Distance:   Bilateral  Distance:    Right Eye Near:   Left Eye Near:    Bilateral Near:  Physical Exam Constitutional:      General: She is not in acute distress.    Appearance: Normal appearance. She is not toxic-appearing or diaphoretic.  HENT:     Head: Normocephalic and atraumatic.     Right Ear: Tympanic membrane and ear canal normal.     Left Ear: Tympanic membrane and ear canal normal.     Nose: Congestion present.     Mouth/Throat:     Mouth: Mucous membranes are moist.     Pharynx: No posterior oropharyngeal erythema.  Eyes:     Extraocular Movements: Extraocular movements intact.     Conjunctiva/sclera: Conjunctivae normal.     Pupils: Pupils are equal, round, and reactive to light.  Cardiovascular:     Rate and Rhythm: Normal rate and regular rhythm.     Pulses: Normal pulses.     Heart sounds: Normal heart sounds.  Pulmonary:     Effort: Pulmonary effort is normal. No respiratory distress.     Breath sounds: Normal breath sounds. No stridor. No wheezing, rhonchi or rales.  Abdominal:     General: Abdomen is flat. Bowel sounds are normal.     Palpations: Abdomen is soft.  Musculoskeletal:        General: Normal range of motion.     Cervical back: Normal range of motion.  Skin:    General: Skin is warm and dry.  Neurological:     General: No focal deficit present.     Mental Status: She is alert and oriented to person, place, and time. Mental status is at baseline.  Psychiatric:        Mood and Affect: Mood normal.        Behavior: Behavior normal.      UC Treatments / Results  Labs (all labs ordered are listed, but only abnormal results are displayed) Labs Reviewed  RESP PANEL BY RT-PCR (FLU A&B, COVID) ARPGX2    EKG   Radiology No results found.  Procedures Procedures (including critical care time)  Medications Ordered in UC Medications - No data to display  Initial Impression / Assessment and Plan / UC Course  I have reviewed the triage vital signs and the  nursing notes.  Pertinent labs & imaging results that were available during my care of the patient were reviewed by me and considered in my medical decision making (see chart for details).     Patient presents with symptoms likely from a viral upper respiratory infection. Differential includes bacterial pneumonia, sinusitis, allergic rhinitis, COVID-19, flu.  Do not think chest imaging is necessary given no signs of respiratory distress and no signs of adventitious lung sounds on exam.  Patient is nontoxic appearing and not in need of emergent medical intervention.  Patient requesting COVID and flu testing so this is pending.  Recommended symptom control with over the counter medications that are safe for patient.  Suspect patient is having asthma exacerbation due to acute illness.  Will treat with prednisone and refill albuterol inhaler.  Patient has taken prednisone previously and has tolerated well. There does no appear to be any obvious contraindications to prednisone on exam.  Patient's vital signs and oxygen are normal.  Not in any acute distress.  Stable for discharge.  Return if symptoms fail to improve. Patient states understanding and is agreeable.  Discharged with PCP followup.  Final Clinical Impressions(s) / UC Diagnoses   Final diagnoses:  Viral upper respiratory tract infection with cough     Discharge Instructions  It appears that you have a viral upper respiratory infection that is exacerbating your asthma.  You have been prescribed albuterol inhaler and prednisone steroid to help alleviate symptoms.  COVID and flu test are pending.  We will call if it is positive.    ED Prescriptions     Medication Sig Dispense Auth. Provider   predniSONE (DELTASONE) 20 MG tablet Take 2 tablets (40 mg total) by mouth daily for 5 days. 10 tablet [place], Hildred Alamin E, [place]   albuterol (VENTOLIN HFA) 108 (90 Base) MCG/ACT inhaler Inhale 1-2 puffs into the lungs every 6 (six) hours as needed  for wheezing or shortness of breath. 1 each Teodora Medici, [place]      PDMP not reviewed this encounter.   Teodora Medici, [place] 10/27/21 [phone]

## 2021-11-16 ENCOUNTER — Encounter (HOSPITAL_COMMUNITY): Payer: Self-pay | Admitting: Emergency Medicine

## 2021-11-16 ENCOUNTER — Ambulatory Visit (HOSPITAL_COMMUNITY)
Admission: EM | Admit: 2021-11-16 | Discharge: 2021-11-16 | Disposition: A | Discharge status: Home / Self Care | Payer: Medicaid Other | Attending: Internal Medicine | Admitting: Internal Medicine

## 2021-11-16 DIAGNOSIS — R0981 Nasal congestion: Secondary | ICD-10-CM | POA: Diagnosis present

## 2021-11-16 DIAGNOSIS — J069 Acute upper respiratory infection, unspecified: Secondary | ICD-10-CM | POA: Diagnosis not present

## 2021-11-16 DIAGNOSIS — R059 Cough, unspecified: Secondary | ICD-10-CM | POA: Diagnosis present

## 2021-11-16 DIAGNOSIS — Z20822 Contact with and (suspected) exposure to covid-19: Secondary | ICD-10-CM | POA: Diagnosis not present

## 2021-11-16 DIAGNOSIS — H109 Unspecified conjunctivitis: Secondary | ICD-10-CM | POA: Diagnosis not present

## 2021-11-16 DIAGNOSIS — H1089 Other conjunctivitis: Secondary | ICD-10-CM | POA: Insufficient documentation

## 2021-11-16 LAB — RESP PANEL BY RT-PCR (FLU A&B, COVID) ARPGX2
Influenza A by PCR: NEGATIVE
Influenza B by PCR: NEGATIVE
SARS Coronavirus 2 by RT PCR: NEGATIVE

## 2021-11-16 MED ORDER — BENZONATATE 100 MG PO CAPS: 100.0000 mg | ORAL_CAPSULE | Freq: Three times a day (TID) | ORAL | 0 refills | Status: AC

## 2021-11-16 MED ORDER — GUAIFENESIN ER 600 MG PO TB12: 1200.0000 mg | ORAL_TABLET | Freq: Two times a day (BID) | ORAL | 0 refills | Status: AC

## 2021-11-16 MED ORDER — IBUPROFEN 800 MG PO TABS
800.0000 mg | ORAL_TABLET | Freq: Once | ORAL | Status: AC
  Administered 2021-11-16: 800 mg via ORAL

## 2021-11-16 MED ORDER — IBUPROFEN 800 MG PO TABS
ORAL_TABLET | ORAL | Status: AC
  Filled 2021-11-16: qty 1

## 2021-11-16 MED ORDER — IBUPROFEN 600 MG PO TABS: 600.0000 mg | ORAL_TABLET | Freq: Four times a day (QID) | ORAL | 0 refills | Status: DC | PRN

## 2021-11-16 MED ORDER — ALBUTEROL SULFATE HFA 108 (90 BASE) MCG/ACT IN AERS: 1.0000 | INHALATION_SPRAY | Freq: Four times a day (QID) | RESPIRATORY_TRACT | 0 refills | Status: AC | PRN

## 2021-11-16 MED ORDER — GENTAMICIN SULFATE 0.3 % OP SOLN: 2.0000 [drp] | OPHTHALMIC | 0 refills | Status: AC

## 2021-11-16 NOTE — ED Provider Notes (Signed)
[place]    CSN: [id number] Arrival date & time: 11/16/21  1248      History   Chief Complaint Chief Complaint  Patient presents with   Cough   Nasal Congestion    HPI [facility] is a 31 y.o. female.   Patient presents to urgent care for evaluation of cough, nasal congestion, body aches, chills, and eye redness that started 3 days ago. Patient had a viral URI/asthma exacerbation earlier in September and was treated with prednisone and albuterol. She fully recovered from this and now this new illness started 3 days ago.  Also reporting that her daughter has bacterial pinkeye and believes that her eye redness is related to this.  Cough is dry and worse at nighttime.  Headache is generalized and patient denies blurry vision, decreased visual acuity, and dizziness.  Headache is currently a 7 on a scale of 0-10.  Nasal congestion makes it difficult for her to breathe through her nose.  States that her "eyes were swollen shut this morning" and reports "crusty drainage with white gunk".  She has been doing warm compresses to both of her eyes without much relief of symptoms.  She intermittently smokes cigars but denies drug use.  Has a history of asthma but this is well controlled with albuterol inhaler as needed.  She has been using over-the-counter Tylenol and Cortisporin for symptoms without much relief.   Cough   Past Medical History:  Diagnosis Date   Asthma    Depression    Gastroschisis    Heart murmur    at birth, no problems since   History of gestational hypertension 06/30/2017   IOL for GHTN 2016   Hypertension    SBO (small bowel obstruction) ([place])    this was during her first year of life, no problems since   Trichomonas infection     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   History of  shoulder dystocia in prior pregnancy 06/30/2017   Supervision of high risk pregnancy, antepartum 06/30/2017   Marijuana abuse 06/30/2017   History of major abdominal surgery 06/30/2017    Past Surgical History:  Procedure Laterality Date   ABDOMINAL SURGERY     GASTROSCHISIS CLOSURE  1991   TONSILLECTOMY      OB History     Gravida  7   Para  2   Term  2   Preterm      AB  5   Living  2      SAB  1   IAB  4   Ectopic      Multiple  0   Live Births  2            Home Medications    Prior to Admission medications   Medication Sig Start Date End Date Taking? Authorizing Provider  benzonatate (TESSALON) 100 MG capsule Take 1 capsule (100 mg total) by mouth every 8 (eight) hours. 11/16/21  Yes Talbot Grumbling, FNP  gentamicin (GARAMYCIN) 0.3 % ophthalmic solution Place 2 drops into both eyes every 4 (four) hours. 11/16/21  Yes Talbot Grumbling, FNP  guaiFENesin (MUCINEX) 600 MG 12 hr tablet Take 2 tablets (1,200 mg total) by mouth 2 (two) times daily. 11/16/21  Yes Talbot Grumbling, FNP  ibuprofen (ADVIL) 600 MG tablet Take 1 tablet (600 mg total) by mouth  every 6 (six) hours as needed. 11/16/21  Yes Talbot Grumbling, FNP  albuterol (VENTOLIN HFA) 108 (90 Base) MCG/ACT inhaler Inhale 1-2 puffs into the lungs every 6 (six) hours as needed for wheezing or shortness of breath. 11/16/21   Talbot Grumbling, FNP  amLODipine (NORVASC) 5 MG tablet Take 1 tablet (5 mg total) by mouth daily. Patient not taking: Reported on 07/08/2021 11/13/18   Rosemarie Ax, MD  amoxicillin-clavulanate (AUGMENTIN) 875-125 MG tablet Take 1 tablet by mouth 2 (two) times daily. Patient not taking: Reported on 07/08/2021 03/10/21   Shelly Bombard, MD  atenolol-chlorthalidone (TENORETIC) 50-25 MG tablet Take 1 tablet by mouth daily.    [provider]  cetirizine (ZYRTEC ALLERGY) 10 MG tablet Take 1 tablet (10 mg total) by mouth daily. Patient not taking:  Reported on 03/10/2021 06/09/20   Volney American, PA-C  fluticasone [facility]) 50 MCG/ACT nasal spray Place 1 spray into both nostrils in the morning and at bedtime. Patient not taking: Reported on 03/24/2021 06/09/20   Volney American, PA-C  lidocaine (XYLOCAINE) 2 % solution Use as directed 15 mLs in the mouth or throat as needed for mouth pain. Patient not taking: Reported on 03/10/2021 06/30/20   Hans Eden, NP  lisinopril (ZESTRIL) 5 MG tablet Take 1 tablet (5 mg total) by mouth daily. Patient not taking: Reported on 07/08/2021 03/13/19   Marcille Buffy D, CNM  metroNIDAZOLE (FLAGYL) 500 MG tablet Take 1 tablet (500 mg total) by mouth 2 (two) times daily. 07/22/21   Woodroe Mode, MD  metroNIDAZOLE (FLAGYL) 500 MG tablet Take 1 tablet (500 mg total) by mouth 2 (two) times daily. 10/22/21   Chancy Milroy, MD  Prenatal Vit-Fe Fumarate-FA (PREPLUS) 27-1 MG TABS Take 1 tablet by mouth daily. Patient not taking: Reported on 02/07/2018 06/30/17   Aletha Halim, MD  promethazine-dextromethorphan (PROMETHAZINE-DM) 6.25-15 MG/5ML syrup Take 5 mLs by mouth 4 (four) times daily as needed for cough. Patient not taking: Reported on 03/24/2021 01/26/21   Vanessa Kick, MD  ranitidine (ZANTAC) 150 MG tablet Take 1 tablet (150 mg total) by mouth 2 (two) times daily. Patient not taking: Reported on 02/07/2018 08/19/17   Virginia Rochester, NP    Family History Family History  Problem Relation Age of Onset   Diabetes Father    Hypertension Father    Healthy Mother     Social History Social History   Tobacco Use   Smoking status: Some Days    Types: Cigars    Passive exposure: Never   Smokeless tobacco: Never   Tobacco comments:    rarely smokes black & milds  Vaping Use   Vaping Use: Never used  Substance Use Topics   Alcohol use: Not Currently    Comment: occ   Drug use: Yes    Types: Marijuana    Comment: last use May 2015     Allergies   Patient has no known  allergies.   Review of Systems Review of Systems  Respiratory:  Positive for cough.   Per HPI   Physical Exam Triage Vital Signs ED Triage Vitals  Enc Vitals Group     BP 11/16/21 1347 116/80     Pulse Rate 11/16/21 1347 87     Resp 11/16/21 1347 17     Temp 11/16/21 1347 98.2 F (36.8 C)     Temp Source 11/16/21 1347 Oral     SpO2 11/16/21 1347 97 %  Weight --      Height --      Head Circumference --      Peak Flow --      Pain Score 11/16/21 1346 7     Pain Loc --      Pain Edu? --      Excl. in [place]? --    No data found.  Updated Vital Signs BP 116/80 (BP Location: Right Arm)   Pulse 87   Temp 98.2 F (36.8 C) (Oral)   Resp 17   LMP 10/11/2021   SpO2 97%   Visual Acuity Right Eye Distance:   Left Eye Distance:   Bilateral Distance:    Right Eye Near:   Left Eye Near:    Bilateral Near:     Physical Exam Vitals and nursing note reviewed.  Constitutional:      Appearance: Normal appearance. She is not ill-appearing or toxic-appearing.  HENT:     Head: Normocephalic and atraumatic.     Right Ear: Hearing, tympanic membrane, ear canal and external ear normal.     Left Ear: Hearing, tympanic membrane, ear canal and external ear normal.     Nose: Congestion present.     Mouth/Throat:     Lips: Pink.     Pharynx: Posterior oropharyngeal erythema present.  Eyes:     General: Lids are normal. Vision grossly intact. Gaze aligned appropriately.        Right eye: Discharge present.        Left eye: Discharge present.    Extraocular Movements: Extraocular movements intact.     Conjunctiva/sclera:     Right eye: Right conjunctiva is injected.     Left eye: Left conjunctiva is injected.     Pupils: Pupils are equal, round, and reactive to light.  Cardiovascular:     Rate and Rhythm: Normal rate and regular rhythm.     Heart sounds: Normal heart sounds, S1 normal and S2 normal.  Pulmonary:     Effort: Pulmonary effort is normal. No respiratory distress.      Breath sounds: Normal breath sounds and air entry.     Comments: No adventitious lung sounds to auscultation. Musculoskeletal:     Cervical back: Neck supple.  Lymphadenopathy:     Cervical: Cervical adenopathy present.  Skin:    General: Skin is warm and dry.     Capillary Refill: Capillary refill takes less than 2 seconds.     Findings: No rash.  Neurological:     General: No focal deficit present.     Mental Status: She is alert and oriented to person, place, and time. Mental status is at baseline.     Cranial Nerves: No dysarthria or facial asymmetry.  Psychiatric:        Mood and Affect: Mood normal.        Speech: Speech normal.        Behavior: Behavior normal.        Thought Content: Thought content normal.        Judgment: Judgment normal.      UC Treatments / Results  Labs (all labs ordered are listed, but only abnormal results are displayed) Labs Reviewed  RESP PANEL BY RT-PCR (FLU A&B, COVID) ARPGX2    EKG   Radiology No results found.  Procedures Procedures (including critical care time)  Medications Ordered in UC Medications  ibuprofen (ADVIL) tablet 800 mg (800 mg Oral Given 11/16/21 1457)    Initial Impression / Assessment and  Plan / UC Course  I have reviewed the triage vital signs and the nursing notes.  Pertinent labs & imaging results that were available during my care of the patient were reviewed by me and considered in my medical decision making (see chart for details).   1.  Viral URI with cough Symptoms and physical exam consistent with a viral upper respiratory tract infection that will likely resolve with rest, fluids, and prescriptions for symptomatic relief.  COVID-19 testing is pending.  We will call patient if this is positive.  Guaifenesin and Tessalon Perles sent to the pharmacy to be used for nasal congestion, cough, and body aches, fever/chills, and discomfort associated with viral illness.  Patient may take Tylenol/ibuprofen  every 6 hours as needed for headache, sore throat, and fever/chills.  Patient given 800 mg of ibuprofen in the clinic to treat headache and body aches and next dose may be in 8 hours with food.  Nonpharmacologic interventions for symptom relief provided and after visit summary below.    2.  Bacterial conjunctivitis Gentamicin eyedrops sent to pharmacy.  Patient may place 2 drops into both eyes every 4 hours for the next 5 to 7 days to treat bacterial conjunctivitis.  Although symptoms are likely viral, we will go ahead and cover for bacterial etiology as patient's daughter has similar symptoms that have responded well to gentamicin eyedrops.  Advised warm compresses and changing pillowcase after 2 to 3 days to prevent reinfection.  Strict ED/urgent care return precautions given.  Patient verbalizes understanding and agreement with plan.  Counseled patient regarding possible side effects and uses of all medications prescribed at today's visit.  Patient verbalizes understanding and agreement with plan.  All questions answered.  Patient discharged from urgent care in stable condition.       Final Clinical Impressions(s) / UC Diagnoses   Final diagnoses:  Viral URI with cough  Bacterial conjunctivitis     Discharge Instructions      You have a viral upper respiratory infection.  COVID-19 and flu testing is pending. We will call you with positive results.  Stay at home until results come back. Your work note is at the end of your packet.  Take guaifenesin '1200mg'$   2 times daily to thin your mucous so that you can cough it up and blow it out of your nose easier. Drink plenty of water while taking this medication so that it works well in your body (at least 8 cups a day).   Take tessalon pearles every 8 hours as needed for cough.  You may take tylenol 1,'000mg'$  and ibuprofen '600mg'$  every 6 hours with food as needed for fever/chills, sore throat, aches/pains, and inflammation associated with viral  illness. Take this with food to avoid stomach upset.    You may do salt water and baking soda gargles every 4 hours as needed for your throat pain.  Please put 1 teaspoon of salt and 1/2 teaspoon of baking soda in 8 ounces of warm water then gargle and spit the water out. You may also put 1 tablespoon of honey in warm water and drink this to soothe your throat.  Place a humidifier in your room at night to help decrease dry air that can irritate your airway and cause you to have a sore throat and cough.  Please try to eat a well-balanced diet while you are sick so that your body gets proper nutrition to heal.  If you develop any new or worsening symptoms, please return.  If your symptoms are severe, please go to the emergency room.  Follow-up with your primary care provider for further evaluation and management of your symptoms as well as ongoing wellness visits.  I hope you feel better!      ED Prescriptions     Medication Sig Dispense Auth. Provider   ibuprofen (ADVIL) 600 MG tablet Take 1 tablet (600 mg total) by mouth every 6 (six) hours as needed. 30 tablet Talbot Grumbling, FNP   guaiFENesin (MUCINEX) 600 MG 12 hr tablet Take 2 tablets (1,200 mg total) by mouth 2 (two) times daily. 30 tablet Joella Prince M, FNP   benzonatate (TESSALON) 100 MG capsule Take 1 capsule (100 mg total) by mouth every 8 (eight) hours. 21 capsule Joella Prince M, FNP   albuterol (VENTOLIN HFA) 108 (90 Base) MCG/ACT inhaler Inhale 1-2 puffs into the lungs every 6 (six) hours as needed for wheezing or shortness of breath. 1 each Talbot Grumbling, FNP   gentamicin (GARAMYCIN) 0.3 % ophthalmic solution Place 2 drops into both eyes every 4 (four) hours. 5 mL Talbot Grumbling, FNP      PDMP not reviewed this encounter.   Talbot Grumbling, [place] 11/16/21 [phone]

## 2021-11-16 NOTE — Discharge Instructions (Addendum)
You have a viral upper respiratory infection.  COVID-19 and flu testing is pending. We will call you with positive results.  Stay at home until results come back. Your work note is at the end of your packet.  Take guaifenesin '1200mg'$   2 times daily to thin your mucous so that you can cough it up and blow it out of your nose easier. Drink plenty of water while taking this medication so that it works well in your body (at least 8 cups a day).   Take tessalon pearles every 8 hours as needed for cough.  You may take tylenol 1,'000mg'$  and ibuprofen '600mg'$  every 6 hours with food as needed for fever/chills, sore throat, aches/pains, and inflammation associated with viral illness. Take this with food to avoid stomach upset.    You may do salt water and baking soda gargles every 4 hours as needed for your throat pain.  Please put 1 teaspoon of salt and 1/2 teaspoon of baking soda in 8 ounces of warm water then gargle and spit the water out. You may also put 1 tablespoon of honey in warm water and drink this to soothe your throat.  Place a humidifier in your room at night to help decrease dry air that can irritate your airway and cause you to have a sore throat and cough.  Please try to eat a well-balanced diet while you are sick so that your body gets proper nutrition to heal.  If you develop any new or worsening symptoms, please return.  If your symptoms are severe, please go to the emergency room.  Follow-up with your primary care provider for further evaluation and management of your symptoms as well as ongoing wellness visits.  I hope you feel better!

## 2021-11-16 NOTE — ED Triage Notes (Signed)
Pt reports was seen here for cough, congestion and chest pains and was seen here for it week ago or so. Reports that symptoms still ongoing. Reports that daughter has pink eye and got it from her and needs to be seen for that today as well.

## 2021-11-26 ENCOUNTER — Encounter (HOSPITAL_COMMUNITY): Payer: Self-pay | Admitting: *Deleted

## 2021-11-26 ENCOUNTER — Ambulatory Visit (HOSPITAL_COMMUNITY)
Admission: EM | Admit: 2021-11-26 | Discharge: 2021-11-26 | Disposition: A | Discharge status: Home / Self Care | Payer: Medicaid Other | Attending: Family Medicine | Admitting: Family Medicine

## 2021-11-26 DIAGNOSIS — J0140 Acute pansinusitis, unspecified: Secondary | ICD-10-CM

## 2021-11-26 DIAGNOSIS — H1033 Unspecified acute conjunctivitis, bilateral: Secondary | ICD-10-CM

## 2021-11-26 MED ORDER — AMOXICILLIN-POT CLAVULANATE 875-125 MG PO TABS: 1.0000 | ORAL_TABLET | Freq: Two times a day (BID) | ORAL | 0 refills | Status: AC

## 2021-11-26 MED ORDER — POLYMYXIN B-TRIMETHOPRIM 10000-0.1 UNIT/ML-% OP SOLN: 2.0000 [drp] | OPHTHALMIC | 0 refills | Status: AC

## 2021-11-26 MED ORDER — PROMETHAZINE-DM 6.25-15 MG/5ML PO SYRP: 5.0000 mL | ORAL_SOLUTION | Freq: Four times a day (QID) | ORAL | 0 refills | Status: DC | PRN

## 2021-11-26 MED ORDER — TRIAMCINOLONE ACETONIDE 0.1 % EX CREA: 1.0000 | TOPICAL_CREAM | Freq: Two times a day (BID) | CUTANEOUS | 0 refills | Status: AC

## 2021-11-26 NOTE — Discharge Instructions (Addendum)
Take amoxicillin-clavulanate 875 mg--1 tab twice daily with food for 7 days  Promethazine-dextromethorphan-1 teaspoon or 5 mL by mouth every 6 hours as needed for cough.  This can make you sleepy  Stop the gentamicin and use Polytrim antibiotic drops in your eyes.  Put 2 drops in both eyes every 4 hours while you are awake.  Do this for about 5 days.  Put triamcinolone cream on the rash areas 2 times daily until they are better.

## 2021-11-26 NOTE — ED Triage Notes (Signed)
Pt states that she still has pink eye and she is using the drops and nothing is helping. She is also having sore throat and congestion and cough still and she would like to know if there is something she get the tessalon pearls arent helping.   She has a dry spot on her left arm she would like some cream for she has been using hydrocortisone.

## 2021-11-26 NOTE — ED Provider Notes (Signed)
[facility]    CSN: [id number] Arrival date & time: 11/26/21  0957      History   Chief Complaint Chief Complaint  Patient presents with   Conjunctivitis    HPI Jody Palmer is a 31 y.o. female.    Conjunctivitis   Here for continued cough and congestion that began over 10 days ago.  She has not had any fever or aches in the last few days.  She is not having any shortness of breath.  She feels like she is getting the mucus out of her chest.  She continues to have a lot of congestion in her head.  The Ladona Ridgel are not helping her cough, which were prescribed on September 25 when she was seen here.  On September 25 she also had redness and irritation and drainage of both eyes, consistent with conjunctivitis.  She has been using gentamicin eyedrops ever since and they are continuing to bother her  Past Medical History:  Diagnosis Date   Asthma    Depression    Gastroschisis    Heart murmur    at birth, no problems since   History of gestational hypertension 06/30/2017   IOL for GHTN 2016   Hypertension    SBO (small bowel obstruction) ([place])    this was during her first year of life, no problems since   Trichomonas infection     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   History of shoulder dystocia in prior pregnancy 06/30/2017   Supervision of high risk pregnancy, antepartum 06/30/2017   Marijuana abuse 06/30/2017   History of major abdominal surgery 06/30/2017    Past Surgical History:  Procedure Laterality Date   ABDOMINAL SURGERY     GASTROSCHISIS CLOSURE  1991   TONSILLECTOMY      OB History     Gravida  7   Para  2   Term  2   Preterm      AB  5   Living  2      SAB  1   IAB  4   Ectopic      Multiple  0   Live Births  2            Home Medications     Prior to Admission medications   Medication Sig Start Date End Date Taking? Authorizing Provider  amoxicillin-clavulanate (AUGMENTIN) 875-125 MG tablet Take 1 tablet by mouth 2 (two) times daily for 7 days. 11/26/21 12/03/21 Yes Ousman Dise, Gwenlyn Perking, MD  benzonatate (TESSALON) 100 MG capsule Take 1 capsule (100 mg total) by mouth every 8 (eight) hours. 11/16/21  Yes Talbot Grumbling, FNP  gentamicin (GARAMYCIN) 0.3 % ophthalmic solution Place 2 drops into both eyes every 4 (four) hours. 11/16/21  Yes Talbot Grumbling, FNP  promethazine-dextromethorphan (PROMETHAZINE-DM) 6.25-15 MG/5ML syrup Take 5 mLs by mouth 4 (four) times daily as needed for cough. 11/26/21  Yes Barrett Henle, MD  triamcinolone cream (KENALOG) 0.1 % Apply 1 Application topically 2 (two) times daily. To affected area till better 11/26/21  Yes Onelia Cadmus, Gwenlyn Perking, MD  trimethoprim-polymyxin b (POLYTRIM) ophthalmic solution Place 2 drops into both eyes every 4 (four) hours. While awake for 5 days 11/26/21  Yes Babita Amaker, Gwenlyn Perking, MD  albuterol (VENTOLIN HFA) 108 (90 Base) MCG/ACT inhaler Inhale 1-2 puffs into the lungs every  6 (six) hours as needed for wheezing or shortness of breath. 11/16/21   Talbot Grumbling, FNP  amLODipine (NORVASC) 5 MG tablet Take 1 tablet (5 mg total) by mouth daily. Patient not taking: Reported on 07/08/2021 11/13/18   Rosemarie Ax, MD  atenolol-chlorthalidone (TENORETIC) 50-25 MG tablet Take 1 tablet by mouth daily.    [provider]  cetirizine (ZYRTEC ALLERGY) 10 MG tablet Take 1 tablet (10 mg total) by mouth daily. Patient not taking: Reported on 03/10/2021 06/09/20   Volney American, PA-C  fluticasone [facility]) 50 MCG/ACT nasal spray Place 1 spray into both nostrils in the morning and at bedtime. Patient not taking: Reported on 03/24/2021 06/09/20   Volney American, PA-C  guaiFENesin (MUCINEX) 600 MG 12 hr tablet Take 2 tablets (1,200 mg total) by mouth 2 (two)  times daily. 11/16/21   Talbot Grumbling, FNP  ibuprofen (ADVIL) 600 MG tablet Take 1 tablet (600 mg total) by mouth every 6 (six) hours as needed. 11/16/21   Talbot Grumbling, FNP  lisinopril (ZESTRIL) 5 MG tablet Take 1 tablet (5 mg total) by mouth daily. Patient not taking: Reported on 07/08/2021 03/13/19   Marcille Buffy D, CNM  Prenatal Vit-Fe Fumarate-FA (PREPLUS) 27-1 MG TABS Take 1 tablet by mouth daily. Patient not taking: Reported on 02/07/2018 06/30/17   Aletha Halim, MD    Family History Family History  Problem Relation Age of Onset   Diabetes Father    Hypertension Father    Healthy Mother     Social History Social History   Tobacco Use   Smoking status: Some Days    Types: Cigars    Passive exposure: Never   Smokeless tobacco: Never   Tobacco comments:    rarely smokes black & milds  Vaping Use   Vaping Use: Never used  Substance Use Topics   Alcohol use: Not Currently    Comment: occ   Drug use: Yes    Types: Marijuana    Comment: last use May 2015     Allergies   Patient has no known allergies.   Review of Systems Review of Systems   Physical Exam Triage Vital Signs ED Triage Vitals  Enc Vitals Group     BP 11/26/21 1110 134/88     Pulse Rate 11/26/21 1110 73     Resp 11/26/21 1110 18     Temp 11/26/21 1110 98.4 F (36.9 C)     Temp Source 11/26/21 1110 Oral     SpO2 11/26/21 1110 98 %     Weight --      Height --      Head Circumference --      Peak Flow --      Pain Score 11/26/21 1107 5     Pain Loc --      Pain Edu? --      Excl. in [place]? --    No data found.  Updated Vital Signs BP 134/88 (BP Location: Left Arm)   Pulse 73   Temp 98.4 F (36.9 C) (Oral)   Resp 18   LMP 11/26/2021   SpO2 98%   Visual Acuity Right Eye Distance:   Left Eye Distance:   Bilateral Distance:    Right Eye Near:   Left Eye Near:    Bilateral Near:     Physical Exam Vitals reviewed.  Constitutional:      General: She is not in  acute distress.    Appearance: She  is not toxic-appearing.  HENT:     Right Ear: Tympanic membrane and ear canal normal.     Left Ear: Tympanic membrane and ear canal normal.     Nose: Nose normal.     Mouth/Throat:     Mouth: Mucous membranes are moist.     Pharynx: No oropharyngeal exudate or posterior oropharyngeal erythema.  Eyes:     Extraocular Movements: Extraocular movements intact.     Pupils: Pupils are equal, round, and reactive to light.     Comments: There is bright pink injection of both conjunctiva.  The lids are not swollen  Cardiovascular:     Rate and Rhythm: Normal rate and regular rhythm.     Heart sounds: No murmur heard. Pulmonary:     Effort: Pulmonary effort is normal. No respiratory distress.     Breath sounds: No stridor. No wheezing, rhonchi or rales.  Musculoskeletal:     Cervical back: Neck supple.  Lymphadenopathy:     Cervical: No cervical adenopathy.  Skin:    Capillary Refill: Capillary refill takes less than 2 seconds.     Coloration: Skin is not jaundiced or pale.  Neurological:     General: No focal deficit present.     Mental Status: She is alert and oriented to person, place, and time.  Psychiatric:        Behavior: Behavior normal.      UC Treatments / Results  Labs (all labs ordered are listed, but only abnormal results are displayed) Labs Reviewed - No data to display  EKG   Radiology No results found.  Procedures Procedures (including critical care time)  Medications Ordered in UC Medications - No data to display  Initial Impression / Assessment and Plan / UC Course  I have reviewed the triage vital signs and the nursing notes.  Pertinent labs & imaging results that were available during my care of the patient were reviewed by me and considered in my medical decision making (see chart for details).        She remembers the Promethazine DM that she was prescribed in December 22 helping better, so that is sent in.   I am going to treat her for acute sinusitis with her having symptoms for about 2 weeks.  I discussed with her that very likely this conjunctivitis is viral, but she is given contact information for ophthalmology, as I think it would be good for her to have an exam anyway. Final Clinical Impressions(s) / UC Diagnoses   Final diagnoses:  Acute pansinusitis, recurrence not specified  Acute conjunctivitis of both eyes, unspecified acute conjunctivitis type     Discharge Instructions      Take amoxicillin-clavulanate 875 mg--1 tab twice daily with food for 7 days  Promethazine-dextromethorphan-1 teaspoon or 5 mL by mouth every 6 hours as needed for cough.  This can make you sleepy  Stop the gentamicin and use Polytrim antibiotic drops in your eyes.  Put 2 drops in both eyes every 4 hours while you are awake.  Do this for about 5 days.  Put triamcinolone cream on the rash areas 2 times daily until they are better.     ED Prescriptions     Medication Sig Dispense Auth. Provider   amoxicillin-clavulanate (AUGMENTIN) 875-125 MG tablet Take 1 tablet by mouth 2 (two) times daily for 7 days. 14 tablet Ediel Unangst, Gwenlyn Perking, MD   trimethoprim-polymyxin b (POLYTRIM) ophthalmic solution Place 2 drops into both eyes every 4 (four) hours. While  awake for 5 days 10 mL Barrett Henle, MD   promethazine-dextromethorphan (PROMETHAZINE-DM) 6.25-15 MG/5ML syrup Take 5 mLs by mouth 4 (four) times daily as needed for cough. 118 mL Barrett Henle, MD   triamcinolone cream (KENALOG) 0.1 % Apply 1 Application topically 2 (two) times daily. To affected area till better 80 g Windy Carina Gwenlyn Perking, MD      PDMP not reviewed this encounter.   Barrett Henle, MD 11/26/21 1159

## 2022-01-27 ENCOUNTER — Ambulatory Visit (INDEPENDENT_AMBULATORY_CARE_PROVIDER_SITE_OTHER): Payer: Medicaid Other | Admitting: General Practice

## 2022-01-27 ENCOUNTER — Other Ambulatory Visit (HOSPITAL_COMMUNITY)
Admission: RE | Admit: 2022-01-27 | Discharge: 2022-01-27 | Disposition: A | Discharge status: Home / Self Care | Payer: Medicaid Other | Source: Ambulatory Visit | Attending: Obstetrics and Gynecology | Admitting: Obstetrics and Gynecology

## 2022-01-27 VITALS — BP 156/99 | HR 86 | Ht 69.0 in | Wt 281.1 lb

## 2022-01-27 DIAGNOSIS — Z113 Encounter for screening for infections with a predominantly sexual mode of transmission: Secondary | ICD-10-CM

## 2022-01-27 NOTE — Progress Notes (Signed)
SUBJECTIVE:  31 y.o. female complains of vaginal odor for 1 week(s). Denies abnormal vaginal bleeding or significant pelvic pain or fever. No UTI symptoms. Denies history of known exposure to STD.  No LMP recorded. Patient has had an implant.  OBJECTIVE:  She appears well, afebrile. Urine dipstick: not done.  ASSESSMENT:  Vaginal Discharge  Vaginal Odor   PLAN:  GC, chlamydia, trichomonas, BVAG, CVAG probe sent to lab. Treatment: To be determined once lab results are received ROV prn if symptoms persist or worsen.

## 2022-01-28 ENCOUNTER — Other Ambulatory Visit: Payer: Self-pay | Admitting: *Deleted

## 2022-01-28 DIAGNOSIS — B9689 Other specified bacterial agents as the cause of diseases classified elsewhere: Secondary | ICD-10-CM

## 2022-01-28 LAB — CERVICOVAGINAL ANCILLARY ONLY
Bacterial Vaginitis (gardnerella): POSITIVE — AB
Candida Glabrata: NEGATIVE
Candida Vaginitis: NEGATIVE
Chlamydia: NEGATIVE
Comment: NEGATIVE
Comment: NEGATIVE
Comment: NEGATIVE
Comment: NEGATIVE
Comment: NEGATIVE
Comment: NORMAL
Neisseria Gonorrhea: NEGATIVE
Trichomonas: NEGATIVE

## 2022-01-28 LAB — HIV ANTIBODY (ROUTINE TESTING W REFLEX): HIV Screen 4th Generation wRfx: NONREACTIVE

## 2022-01-28 LAB — HEPATITIS C ANTIBODY: Hep C Virus Ab: NONREACTIVE

## 2022-01-28 LAB — RPR: RPR Ser Ql: NONREACTIVE

## 2022-01-28 LAB — HEPATITIS B SURFACE ANTIGEN: Hepatitis B Surface Ag: NEGATIVE

## 2022-01-28 MED ORDER — METRONIDAZOLE 500 MG PO TABS: 500.0000 mg | ORAL_TABLET | Freq: Two times a day (BID) | ORAL | 0 refills | Status: DC

## 2022-01-28 NOTE — Progress Notes (Signed)
TC. Pt notified of BV and RX sent. Verbalized understanding.

## 2022-05-12 ENCOUNTER — Ambulatory Visit (INDEPENDENT_AMBULATORY_CARE_PROVIDER_SITE_OTHER): Payer: Medicaid Other

## 2022-05-12 ENCOUNTER — Other Ambulatory Visit (HOSPITAL_COMMUNITY)
Admission: RE | Admit: 2022-05-12 | Discharge: 2022-05-12 | Disposition: A | Discharge status: Home / Self Care | Payer: Medicaid Other | Source: Ambulatory Visit | Attending: Obstetrics and Gynecology | Admitting: Obstetrics and Gynecology

## 2022-05-12 VITALS — BP 159/103 | HR 88

## 2022-05-12 DIAGNOSIS — N898 Other specified noninflammatory disorders of vagina: Secondary | ICD-10-CM | POA: Insufficient documentation

## 2022-05-12 NOTE — Progress Notes (Signed)
SUBJECTIVE:  32 y.o. female complains of  vaginal discharge and vaginal odor for a few days now . Denies abnormal vaginal bleeding or significant pelvic pain or fever. No UTI symptoms. Denies history of known exposure to STD.  No LMP recorded. Patient has had an implant.  OBJECTIVE:  She appears well, afebrile. Urine dipstick: not done.  ASSESSMENT:  Vaginal Discharge  Vaginal Odor Pt notes not taking B/P Rx has to pick it up today.   PLAN:  GC, chlamydia, trichomonas, BVAG, CVAG probe sent to lab. Treatment: To be determined once lab results are received ROV prn if symptoms persist or worsen.

## 2022-05-13 LAB — CERVICOVAGINAL ANCILLARY ONLY
Bacterial Vaginitis (gardnerella): POSITIVE — AB
Candida Glabrata: NEGATIVE
Candida Vaginitis: NEGATIVE
Chlamydia: NEGATIVE
Comment: NEGATIVE
Comment: NEGATIVE
Comment: NEGATIVE
Comment: NEGATIVE
Comment: NEGATIVE
Comment: NORMAL
Neisseria Gonorrhea: NEGATIVE
Trichomonas: NEGATIVE

## 2022-05-14 ENCOUNTER — Other Ambulatory Visit: Payer: Self-pay | Admitting: Obstetrics and Gynecology

## 2022-05-14 MED ORDER — METRONIDAZOLE 500 MG PO TABS: 500.0000 mg | ORAL_TABLET | Freq: Two times a day (BID) | ORAL | 0 refills | Status: DC

## 2022-05-26 ENCOUNTER — Ambulatory Visit
Admission: EM | Admit: 2022-05-26 | Discharge: 2022-05-26 | Disposition: A | Discharge status: Home / Self Care | Payer: Medicaid Other | Attending: Emergency Medicine | Admitting: Emergency Medicine

## 2022-05-26 DIAGNOSIS — R059 Cough, unspecified: Secondary | ICD-10-CM | POA: Diagnosis not present

## 2022-05-26 DIAGNOSIS — Z1152 Encounter for screening for COVID-19: Secondary | ICD-10-CM | POA: Diagnosis not present

## 2022-05-26 DIAGNOSIS — J069 Acute upper respiratory infection, unspecified: Secondary | ICD-10-CM

## 2022-05-26 LAB — POCT INFLUENZA A/B
Influenza A, POC: NEGATIVE
Influenza B, POC: NEGATIVE

## 2022-05-26 LAB — POCT RAPID STREP A (OFFICE): Rapid Strep A Screen: NEGATIVE

## 2022-05-26 MED ORDER — IPRATROPIUM BROMIDE 0.03 % NA SOLN: 2.0000 | Freq: Two times a day (BID) | NASAL | 12 refills | Status: AC

## 2022-05-26 MED ORDER — MECLIZINE HCL 12.5 MG PO TABS: 12.5000 mg | ORAL_TABLET | Freq: Three times a day (TID) | ORAL | 0 refills | Status: AC | PRN

## 2022-05-26 MED ORDER — LIDOCAINE VISCOUS HCL 2 % MT SOLN: 5.0000 mL | Freq: Four times a day (QID) | OROMUCOSAL | 0 refills | Status: DC | PRN

## 2022-05-26 NOTE — Discharge Instructions (Addendum)
Your symptoms today are most likely being caused by a virus and should steadily improve in time it can take up to 7 to 10 days before you truly start to see a turnaround however things will get better  May gargle and spit Magic mouthwash solution every 4-6 hours as needed to provide temporary relief to your throat  Dizziness is most likely related to sinus congestion, may use meclizine every 8 hours as needed to help calm symptoms  May use nasal spray every morning and every evening to help clear out sinuses  May continue use of over-the-counter medications or to attempt any of the following below  Flu and strep negative.  COVID test pending, you will be notified of positive test results only, current quarantine guidelines per CDC are that you may return to work or activities when you are feeling better in 24 hours without a fever    You can take Tylenol and/or Ibuprofen as needed for fever reduction and pain relief.   For cough: honey 1/2 to 1 teaspoon (you can dilute the honey in water or another fluid).  You can also use guaifenesin and dextromethorphan for cough. You can use a humidifier for chest congestion and cough.  If you don't have a humidifier, you can sit in the bathroom with the hot shower running.      For sore throat: try warm salt water gargles, cepacol lozenges, throat spray, warm tea or water with lemon/honey, popsicles or ice, or OTC cold relief medicine for throat discomfort.   For congestion: take a daily anti-histamine like Zyrtec, Claritin, and a oral decongestant, such as pseudoephedrine.  You can also use Flonase 1-2 sprays in each nostril daily.   It is important to stay hydrated: drink plenty of fluids (water, gatorade/powerade/pedialyte, juices, or teas) to keep your throat moisturized and help further relieve irritation/discomfort.

## 2022-05-26 NOTE — ED Triage Notes (Signed)
Pt c/o headache, body aches, malaise, fatigue, sore throat, fevers at home   Onset ~ yesterday

## 2022-05-26 NOTE — ED Provider Notes (Signed)
[facility]    CSN: [id number] Arrival date & time: 05/26/22  1132      History   Chief Complaint Chief Complaint  Patient presents with   Generalized Body Aches    HPI Jody Palmer is a 32 y.o. female.   Patient presents for evaluation of fever, chills, body aches, nasal congestion, rhinorrhea, sore throat, cough, wheezing and headaches present for 1 day.  Painful to swallow and feels like there is mucus sitting within the throat.  Decreased appetite with associated nausea but denies vomiting.  No known sick contacts prior.  Has attempted use of Coricidin.  History of asthma.  Denies shortness of breath.    Past Medical History:  Diagnosis Date   Asthma    Depression    Gastroschisis    Heart murmur    at birth, no problems since   History of gestational hypertension 06/30/2017   IOL for GHTN 2016   Hypertension    SBO (small bowel obstruction)    this was during her first year of life, no problems since   Trichomonas infection     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   History of shoulder dystocia in prior pregnancy 06/30/2017   Supervision of high risk pregnancy, antepartum 06/30/2017   Marijuana abuse 06/30/2017   History of major abdominal surgery 06/30/2017    Past Surgical History:  Procedure Laterality Date   ABDOMINAL SURGERY     GASTROSCHISIS CLOSURE  1991   TONSILLECTOMY      OB History     Gravida  7   Para  2   Term  2   Preterm      AB  5   Living  2      SAB  1   IAB  4   Ectopic      Multiple  0   Live Births  2            Home Medications    Prior to Admission medications   Medication Sig Start Date End Date Taking? Authorizing Provider  albuterol (VENTOLIN HFA) 108 (90 Base) MCG/ACT inhaler Inhale 1-2 puffs into the lungs every 6 (six)  hours as needed for wheezing or shortness of breath. 11/16/21   Talbot Grumbling, FNP  amLODipine (NORVASC) 5 MG tablet Take 1 tablet (5 mg total) by mouth daily. Patient not taking: Reported on 07/08/2021 11/13/18   Rosemarie Ax, MD  atenolol-chlorthalidone (TENORETIC) 50-25 MG tablet Take 1 tablet by mouth daily.    [provider]  benzonatate (TESSALON) 100 MG capsule Take 1 capsule (100 mg total) by mouth every 8 (eight) hours. 11/16/21   Talbot Grumbling, FNP  cetirizine (ZYRTEC ALLERGY) 10 MG tablet Take 1 tablet (10 mg total) by mouth daily. Patient not taking: Reported on 03/10/2021 06/09/20   Volney American, PA-C  fluticasone [facility]) 50 MCG/ACT nasal spray Place 1 spray into both nostrils in the morning and at bedtime. Patient not taking: Reported on 03/24/2021 06/09/20   Volney American, PA-C  gentamicin (GARAMYCIN) 0.3 % ophthalmic solution Place 2 drops into both eyes every 4 (four) hours. 11/16/21   Talbot Grumbling, FNP  guaiFENesin (MUCINEX) 600 MG 12 hr tablet Take 2 tablets (1,200 mg total) by mouth 2 (two) times daily. Patient not taking: Reported on 01/27/2022 11/16/21  Talbot Grumbling, FNP  ibuprofen (ADVIL) 600 MG tablet Take 1 tablet (600 mg total) by mouth every 6 (six) hours as needed. 11/16/21   Talbot Grumbling, FNP  lisinopril (ZESTRIL) 5 MG tablet Take 1 tablet (5 mg total) by mouth daily. Patient not taking: Reported on 07/08/2021 03/13/19   Marcille Buffy D, CNM  metroNIDAZOLE (FLAGYL) 500 MG tablet Take 1 tablet (500 mg total) by mouth 2 (two) times daily. Patient not taking: Reported on 05/12/2022 01/28/22   Chancy Milroy, MD  metroNIDAZOLE (FLAGYL) 500 MG tablet Take 1 tablet (500 mg total) by mouth 2 (two) times daily. 05/14/22   Constant, Peggy, MD  Prenatal Vit-Fe Fumarate-FA (PREPLUS) 27-1 MG TABS Take 1 tablet by mouth daily. Patient not taking: Reported on 02/07/2018 06/30/17   Aletha Halim, MD   promethazine-dextromethorphan (PROMETHAZINE-DM) 6.25-15 MG/5ML syrup Take 5 mLs by mouth 4 (four) times daily as needed for cough. Patient not taking: Reported on 01/27/2022 11/26/21   Barrett Henle, MD  triamcinolone cream (KENALOG) 0.1 % Apply 1 Application topically 2 (two) times daily. To affected area till better 11/26/21   Barrett Henle, MD  trimethoprim-polymyxin b (POLYTRIM) ophthalmic solution Place 2 drops into both eyes every 4 (four) hours. While awake for 5 days 11/26/21   Barrett Henle, MD    Family History Family History  Problem Relation Age of Onset   Diabetes Father    Hypertension Father    Healthy Mother     Social History Social History   Tobacco Use   Smoking status: Some Days    Types: Cigars    Passive exposure: Never   Smokeless tobacco: Never   Tobacco comments:    rarely smokes black & milds  Vaping Use   Vaping Use: Never used  Substance Use Topics   Alcohol use: Not Currently    Comment: occ   Drug use: Yes    Types: Marijuana    Comment: last use May 2015     Allergies   Patient has no known allergies.   Review of Systems Review of Systems  Constitutional:  Positive for chills and fever. Negative for activity change, appetite change, diaphoresis, fatigue and unexpected weight change.  HENT:  Positive for congestion, rhinorrhea and sore throat. Negative for dental problem, drooling, ear discharge, ear pain, facial swelling, hearing loss, mouth sores, nosebleeds, postnasal drip, sinus pressure, sinus pain, sneezing, tinnitus, trouble swallowing and voice change.   Respiratory:  Positive for cough and wheezing. Negative for apnea, choking, chest tightness, shortness of breath and stridor.   Cardiovascular: Negative.   Gastrointestinal: Negative.   Musculoskeletal:  Positive for myalgias. Negative for arthralgias, back pain, gait problem, joint swelling, neck pain and neck stiffness.  Skin: Negative.   Neurological:  Positive for  headaches. Negative for dizziness, tremors, seizures, syncope, facial asymmetry, speech difficulty, weakness, light-headedness and numbness.     Physical Exam Triage Vital Signs ED Triage Vitals [05/26/22 1158]  Enc Vitals Group     BP (!) 150/97     Pulse Rate (!) 110     Resp 20     Temp 98.3 F (36.8 C)     Temp Source Oral     SpO2 95 %     Weight      Height      Head Circumference      Peak Flow      Pain Score 9     Pain Loc  Pain Edu?      Excl. in [place]?    No data found.  Updated Vital Signs BP (!) 150/97 (BP Location: Left Arm)   Pulse (!) 110   Temp 98.3 F (36.8 C) (Oral)   Resp 20   SpO2 95%   Visual Acuity Right Eye Distance:   Left Eye Distance:   Bilateral Distance:    Right Eye Near:   Left Eye Near:    Bilateral Near:     Physical Exam Constitutional:      Appearance: She is ill-appearing.  HENT:     Head: Normocephalic.     Right Ear: Tympanic membrane, ear canal and external ear normal.     Left Ear: Tympanic membrane, ear canal and external ear normal.     Nose: Congestion and rhinorrhea present.     Mouth/Throat:     Mouth: Mucous membranes are moist.     Pharynx: Posterior oropharyngeal erythema present.     Tonsils: No tonsillar exudate. 1+ on the right. 1+ on the left.     Comments: Copious mucus present along the pharynx Cardiovascular:     Rate and Rhythm: Normal rate and regular rhythm.     Pulses: Normal pulses.     Heart sounds: Normal heart sounds.  Pulmonary:     Effort: Pulmonary effort is normal.     Breath sounds: Normal breath sounds.  Skin:    General: Skin is warm and dry.  Neurological:     Mental Status: She is alert and oriented to person, place, and time. Mental status is at baseline.  Psychiatric:        Mood and Affect: Mood normal.        Behavior: Behavior normal.      UC Treatments / Results  Labs (all labs ordered are listed, but only abnormal results are displayed) Labs Reviewed  SARS  CORONAVIRUS 2 (TAT 6-24 HRS)  POCT INFLUENZA A/B    EKG   Radiology No results found.  Procedures Procedures (including critical care time)  Medications Ordered in UC Medications - No data to display  Initial Impression / Assessment and Plan / UC Course  I have reviewed the triage vital signs and the nursing notes.  Pertinent labs & imaging results that were available during my care of the patient were reviewed by me and considered in my medical decision making (see chart for details).  Viral URI with cough  Patient is in no signs of distress nor toxic appearing.  Vital signs are stable.  Low suspicion for pneumonia, pneumothorax or bronchitis and therefore will defer imaging.  Flu and strep testing negative.  Discussed findings with patient. COVID test is pending, reviewed quarantine guidelines per CDC recommendations. Prescribed meclizine, Atrovent nasal spray and Magic mouthwash.May use additional over-the-counter medications as needed for supportive care.  May follow-up with urgent care as needed if symptoms persist or worsen.  Note given.   Final Clinical Impressions(s) / UC Diagnoses   Final diagnoses:  Viral URI with cough   Discharge Instructions   None    ED Prescriptions   None    PDMP not reviewed this encounter.   Hans Eden, NP 05/26/22 1324

## 2022-05-27 LAB — SARS CORONAVIRUS 2 (TAT 6-24 HRS): SARS Coronavirus 2: NEGATIVE

## 2022-05-28 ENCOUNTER — Encounter (HOSPITAL_COMMUNITY): Payer: Self-pay | Admitting: Emergency Medicine

## 2022-05-28 ENCOUNTER — Emergency Department (HOSPITAL_COMMUNITY)
Admission: EM | Admit: 2022-05-28 | Discharge: 2022-05-28 | Disposition: A | Discharge status: Home / Self Care | Payer: Medicaid Other | Attending: Emergency Medicine | Admitting: Emergency Medicine

## 2022-05-28 ENCOUNTER — Emergency Department (HOSPITAL_COMMUNITY): Payer: Medicaid Other

## 2022-05-28 ENCOUNTER — Other Ambulatory Visit: Payer: Self-pay

## 2022-05-28 DIAGNOSIS — W01198A Fall on same level from slipping, tripping and stumbling with subsequent striking against other object, initial encounter: Secondary | ICD-10-CM | POA: Diagnosis not present

## 2022-05-28 DIAGNOSIS — Y92240 Courthouse as the place of occurrence of the external cause: Secondary | ICD-10-CM | POA: Insufficient documentation

## 2022-05-28 DIAGNOSIS — J111 Influenza due to unidentified influenza virus with other respiratory manifestations: Secondary | ICD-10-CM

## 2022-05-28 DIAGNOSIS — S01111A Laceration without foreign body of right eyelid and periocular area, initial encounter: Secondary | ICD-10-CM | POA: Insufficient documentation

## 2022-05-28 DIAGNOSIS — R55 Syncope and collapse: Secondary | ICD-10-CM | POA: Diagnosis not present

## 2022-05-28 DIAGNOSIS — Z79899 Other long term (current) drug therapy: Secondary | ICD-10-CM | POA: Insufficient documentation

## 2022-05-28 DIAGNOSIS — S0990XA Unspecified injury of head, initial encounter: Secondary | ICD-10-CM

## 2022-05-28 DIAGNOSIS — I10 Essential (primary) hypertension: Secondary | ICD-10-CM | POA: Insufficient documentation

## 2022-05-28 DIAGNOSIS — S0993XA Unspecified injury of face, initial encounter: Secondary | ICD-10-CM | POA: Diagnosis present

## 2022-05-28 DIAGNOSIS — E860 Dehydration: Secondary | ICD-10-CM | POA: Diagnosis not present

## 2022-05-28 DIAGNOSIS — E876 Hypokalemia: Secondary | ICD-10-CM

## 2022-05-28 DIAGNOSIS — D72829 Elevated white blood cell count, unspecified: Secondary | ICD-10-CM | POA: Diagnosis not present

## 2022-05-28 DIAGNOSIS — S0181XA Laceration without foreign body of other part of head, initial encounter: Secondary | ICD-10-CM

## 2022-05-28 LAB — CBC WITH DIFFERENTIAL/PLATELET
Abs Immature Granulocytes: 0.04 10*3/uL (ref 0.00–0.07)
Basophils Absolute: 0 10*3/uL (ref 0.0–0.1)
Basophils Relative: 0 %
Eosinophils Absolute: 0 10*3/uL (ref 0.0–0.5)
Eosinophils Relative: 0 %
HCT: 38.3 % (ref 36.0–46.0)
Hemoglobin: 12.4 g/dL (ref 12.0–15.0)
Immature Granulocytes: 1 %
Lymphocytes Relative: 15 %
Lymphs Abs: 0.9 10*3/uL (ref 0.7–4.0)
MCH: 27.6 pg (ref 26.0–34.0)
MCHC: 32.4 g/dL (ref 30.0–36.0)
MCV: 85.1 fL (ref 80.0–100.0)
Monocytes Absolute: 0.8 10*3/uL (ref 0.1–1.0)
Monocytes Relative: 13 %
Neutro Abs: 4.7 10*3/uL (ref 1.7–7.7)
Neutrophils Relative %: 71 %
Platelets: 311 10*3/uL (ref 150–400)
RBC: 4.5 MIL/uL (ref 3.87–5.11)
RDW: 14.4 % (ref 11.5–15.5)
WBC: 6.5 10*3/uL (ref 4.0–10.5)
nRBC: 0 % (ref 0.0–0.2)

## 2022-05-28 LAB — COMPREHENSIVE METABOLIC PANEL
ALT: 9 U/L (ref 0–44)
AST: 20 U/L (ref 15–41)
Albumin: 2.7 g/dL — ABNORMAL LOW (ref 3.5–5.0)
Alkaline Phosphatase: 55 U/L (ref 38–126)
Anion gap: 13 (ref 5–15)
BUN: 20 mg/dL (ref 6–20)
CO2: 27 mmol/L (ref 22–32)
Calcium: 8.3 mg/dL — ABNORMAL LOW (ref 8.9–10.3)
Chloride: 95 mmol/L — ABNORMAL LOW (ref 98–111)
Creatinine, Ser: 1.37 mg/dL — ABNORMAL HIGH (ref 0.44–1.00)
GFR, Estimated: 52 mL/min — ABNORMAL LOW (ref >60)
Glucose, Bld: 91 mg/dL (ref 70–99)
Potassium: 2.9 mmol/L — ABNORMAL LOW (ref 3.5–5.1)
Sodium: 135 mmol/L (ref 135–145)
Total Bilirubin: 0.7 mg/dL (ref 0.3–1.2)
Total Protein: 7.9 g/dL (ref 6.5–8.1)

## 2022-05-28 LAB — I-STAT BETA HCG BLOOD, ED (MC, WL, AP ONLY): I-stat hCG, quantitative: 10.9 m[IU]/mL — ABNORMAL HIGH (ref <5)

## 2022-05-28 MED ORDER — SODIUM CHLORIDE 0.9 % IV SOLN: INTRAVENOUS | Status: DC

## 2022-05-28 MED ORDER — LIDOCAINE-EPINEPHRINE-TETRACAINE (LET) TOPICAL GEL
3.0000 mL | Freq: Once | TOPICAL | Status: AC
  Administered 2022-05-28: 3 mL via TOPICAL
  Filled 2022-05-28: qty 3

## 2022-05-28 MED ORDER — ACETAMINOPHEN 500 MG PO TABS
1000.0000 mg | ORAL_TABLET | Freq: Once | ORAL | Status: AC
  Administered 2022-05-28: 1000 mg via ORAL
  Filled 2022-05-28: qty 2

## 2022-05-28 MED ORDER — POTASSIUM CHLORIDE CRYS ER 20 MEQ PO TBCR: 20.0000 meq | EXTENDED_RELEASE_TABLET | Freq: Two times a day (BID) | ORAL | 0 refills | Status: AC

## 2022-05-28 MED ORDER — SODIUM CHLORIDE 0.9 % IV BOLUS
500.0000 mL | Freq: Once | INTRAVENOUS | Status: AC
  Administered 2022-05-28: 500 mL via INTRAVENOUS

## 2022-05-28 MED ORDER — ONDANSETRON HCL 4 MG/2ML IJ SOLN
4.0000 mg | Freq: Once | INTRAMUSCULAR | Status: AC
  Administered 2022-05-28: 4 mg via INTRAVENOUS
  Filled 2022-05-28: qty 2

## 2022-05-28 MED ORDER — ACETAMINOPHEN 500 MG PO TABS: 1000.0000 mg | ORAL_TABLET | Freq: Three times a day (TID) | ORAL | 0 refills | Status: AC | PRN

## 2022-05-28 MED ORDER — IBUPROFEN 800 MG PO TABS: 800.0000 mg | ORAL_TABLET | Freq: Three times a day (TID) | ORAL | 1 refills | Status: DC

## 2022-05-28 MED ORDER — LIDOCAINE HCL 2 % IJ SOLN
10.0000 mL | Freq: Once | INTRAMUSCULAR | Status: AC
  Administered 2022-05-28: 200 mg
  Filled 2022-05-28: qty 20

## 2022-05-28 NOTE — ED Triage Notes (Signed)
[place] [facility] [place] [facility] [place]. Pt had a syncope episode, fell and hit right side of her head. Pt report being dizzy before falling. Initial BP 90/50 manually, pulse 88, blood sugar 116. Pt receive 500 of NS bolus. Laceration to the right eye, and eyelid.

## 2022-05-28 NOTE — ED Provider Notes (Signed)
[place] [facility]   CSN: [id number] Arrival date & time: 05/28/22  [id number]     History  Chief Complaint  Patient presents with   Loss of Consciousness    Jody Palmer is a 32 y.o. female.  Patient was seen and care on April 3.  Evaluated for flulike upper respiratory infection.  Strep was negative COVID and influenza testing was negative.  [place] had a syncopal episode fell onto the right side of her face and head.  Patient states that sound kind of went away she started to feel lightheaded.  When she was on the ground according EMS blood pressure was 90/50 upon arrival here blood pressure was in the 120s now getting blood pressures in the 130s.  EMS gave her 500 cc bolus was noted to have laceration to the right eyebrow area and a small 1 to the right eyelid.  Patient had c-collar on upon arrival.  Temp here 98.3 oxygen saturations always been 100%.  Heart rate 85 respirations 19.  Patient states that she has not been eating and drinking very well the past few days.  Past medical history significant for gastroschisis hypertension gestational hypertension in 2019.  Past surgical history gastric pieces closure in 1991.  Tonsillectomy.  Patient occasionally smokes cigars.       Home Medications Prior to Admission medications   Medication Sig Start Date End Date Taking? Authorizing Provider  acetaminophen (TYLENOL) 500 MG tablet Take 2 tablets (1,000 mg total) by mouth every 8 (eight) hours as needed. 05/28/22  Yes Vanetta MuldersZackowski, Glen Kesinger, MD  ibuprofen (ADVIL) 800 MG tablet Take 1 tablet (800 mg total) by mouth 3 (three) times daily. 05/28/22  Yes Vanetta MuldersZackowski, Jackson Fetters, MD  potassium chloride SA (KLOR-CON M) 20 MEQ tablet Take 1 tablet (20 mEq total) by mouth 2 (two) times daily. 05/28/22  Yes Vanetta MuldersZackowski, Lovinia Snare, MD  albuterol (VENTOLIN HFA) 108 (90 Base) MCG/ACT inhaler Inhale 1-2 puffs into the lungs every 6 (six) hours as  needed for wheezing or shortness of breath. 11/16/21   Carlisle BeersStanhope, Catharine M, FNP  amLODipine (NORVASC) 5 MG tablet Take 1 tablet (5 mg total) by mouth daily. Patient not taking: Reported on 07/08/2021 11/13/18   Myra RudeSchmitz, Jeremy E, MD  atenolol-chlorthalidone (TENORETIC) 50-25 MG tablet Take 1 tablet by mouth daily.    [provider]  benzonatate (TESSALON) 100 MG capsule Take 1 capsule (100 mg total) by mouth every 8 (eight) hours. 11/16/21   Carlisle BeersStanhope, Catharine M, FNP  cetirizine (ZYRTEC ALLERGY) 10 MG tablet Take 1 tablet (10 mg total) by mouth daily. Patient not taking: Reported on 03/10/2021 06/09/20   Particia NearingLane, Rachel Elizabeth, PA-C  fluticasone [facility](FLONASE) 50 MCG/ACT nasal spray Place 1 spray into both nostrils in the morning and at bedtime. Patient not taking: Reported on 03/24/2021 06/09/20   Particia NearingLane, Rachel Elizabeth, PA-C  gentamicin (GARAMYCIN) 0.3 % ophthalmic solution Place 2 drops into both eyes every 4 (four) hours. 11/16/21   Carlisle BeersStanhope, Catharine M, FNP  guaiFENesin (MUCINEX) 600 MG 12 hr tablet Take 2 tablets (1,200 mg total) by mouth 2 (two) times daily. Patient not taking: Reported on 01/27/2022 11/16/21   Carlisle BeersStanhope, Catharine M, FNP  ibuprofen (ADVIL) 600 MG tablet Take 1 tablet (600 mg total) by mouth every 6 (six) hours as needed. 11/16/21   Carlisle BeersStanhope, Catharine M, FNP  ipratropium (ATROVENT) 0.03 % nasal spray Place 2 sprays into both nostrils every 12 (twelve) hours. 05/26/22  White, Adrienne R, NP  lisinopril (ZESTRIL) 5 MG tablet Take 1 tablet (5 mg total) by mouth daily. Patient not taking: Reported on 07/08/2021 03/13/19   Thressa Sheller D, CNM  magic mouthwash (lidocaine, diphenhydrAMINE, alum & mag hydroxide) suspension Swish and swallow 5 mLs 4 (four) times daily as needed for mouth pain. 05/26/22   Valinda Hoar, NP  meclizine (ANTIVERT) 12.5 MG tablet Take 1 tablet (12.5 mg total) by mouth 3 (three) times daily as needed for dizziness. 05/26/22   White, Elita Boone, NP   metroNIDAZOLE (FLAGYL) 500 MG tablet Take 1 tablet (500 mg total) by mouth 2 (two) times daily. Patient not taking: Reported on 05/12/2022 01/28/22   Hermina Staggers, MD  metroNIDAZOLE (FLAGYL) 500 MG tablet Take 1 tablet (500 mg total) by mouth 2 (two) times daily. 05/14/22   Constant, Peggy, MD  Prenatal Vit-Fe Fumarate-FA (PREPLUS) 27-1 MG TABS Take 1 tablet by mouth daily. Patient not taking: Reported on 02/07/2018 06/30/17   [place] Bing, MD  promethazine-dextromethorphan (PROMETHAZINE-DM) 6.25-15 MG/5ML syrup Take 5 mLs by mouth 4 (four) times daily as needed for cough. Patient not taking: Reported on 01/27/2022 11/26/21   Zenia Resides, MD  triamcinolone cream (KENALOG) 0.1 % Apply 1 Application topically 2 (two) times daily. To affected area till better 11/26/21   Zenia Resides, MD  trimethoprim-polymyxin b (POLYTRIM) ophthalmic solution Place 2 drops into both eyes every 4 (four) hours. While awake for 5 days 11/26/21   Zenia Resides, MD      Allergies    Patient has no known allergies.    Review of Systems   Review of Systems  Constitutional:  Negative for chills and fever.  HENT:  Positive for congestion and sore throat. Negative for ear pain.   Eyes:  Negative for pain and visual disturbance.  Respiratory:  Positive for cough. Negative for shortness of breath.   Cardiovascular:  Negative for chest pain and palpitations.  Gastrointestinal:  Negative for abdominal pain and vomiting.  Genitourinary:  Negative for dysuria and hematuria.  Musculoskeletal:  Negative for arthralgias and back pain.  Skin:  Positive for wound. Negative for color change and rash.  Neurological:  Positive for dizziness and headaches. Negative for seizures, syncope, speech difficulty and weakness.  All other systems reviewed and are negative.   Physical Exam Updated Vital Signs BP 125/73 (BP Location: Left Arm)   Pulse 84   Temp 98.1 F (36.7 C) (Oral)   Resp 20   Ht 1.753 m (5\' 9" )    Wt 123.4 kg   SpO2 100%   BMI 40.17 kg/m  Physical Exam Vitals and nursing note reviewed.  Constitutional:      General: She is not in acute distress.    Appearance: She is well-developed.  HENT:     Head: Normocephalic.     Comments: Laceration to the right eyebrow.  About a 1 cm laceration just below that on the right upper eyelid.  Some swelling noted to the forehead area.  Associated with some contusion.    Mouth/Throat:     Mouth: Mucous membranes are moist.     Pharynx: Oropharynx is clear.     Comments: Uvula midline.  No significant swelling no significant erythema no exudate. Eyes:     Extraocular Movements: Extraocular movements intact.     Conjunctiva/sclera: Conjunctivae normal.     Pupils: Pupils are equal, round, and reactive to light.     Comments: Extraocular muscles intact.  No entrapment.  Sclera clear.  Neck:     Comments: Cervical collar in place. Cardiovascular:     Rate and Rhythm: Normal rate and regular rhythm.     Heart sounds: No murmur heard. Pulmonary:     Effort: Pulmonary effort is normal. No respiratory distress.     Breath sounds: Normal breath sounds. No wheezing or rales.  Abdominal:     Palpations: Abdomen is soft.     Tenderness: There is no abdominal tenderness.  Musculoskeletal:        General: No swelling.     Cervical back: Neck supple.  Skin:    General: Skin is warm and dry.     Capillary Refill: Capillary refill takes less than 2 seconds.  Neurological:     General: No focal deficit present.     Mental Status: She is alert and oriented to person, place, and time.     Cranial Nerves: No cranial nerve deficit.     Sensory: No sensory deficit.     Motor: No weakness.  Psychiatric:        Mood and Affect: Mood normal.     ED Results / Procedures / Treatments   Labs (all labs ordered are listed, but only abnormal results are displayed) Labs Reviewed  COMPREHENSIVE METABOLIC PANEL - Abnormal; Notable for the following  components:      Result Value   Potassium 2.9 (*)    Chloride 95 (*)    Creatinine, Ser 1.37 (*)    Calcium 8.3 (*)    Albumin 2.7 (*)    GFR, Estimated 52 (*)    All other components within normal limits  I-STAT BETA HCG BLOOD, ED ([facility], [facility], [facility] ONLY) - Abnormal; Notable for the following components:   I-stat hCG, quantitative 10.9 (*)    All other components within normal limits  CBC WITH DIFFERENTIAL/PLATELET    EKG EKG Interpretation  Date/Time:  Friday May 28 2022 09:43:14 EDT Ventricular Rate:  79 PR Interval:  149 QRS Duration: 92 QT Interval:  399 QTC Calculation: 458 R Axis:   45 Text Interpretation: Sinus rhythm Borderline T abnormalities, inferior leads No previous ECGs available Confirmed by Vanetta MuldersZackowski, Jodine Muchmore [phone] on 05/28/2022 9:47:01 AM  Radiology DG Chest Port 1 View  Result Date: 05/28/2022 CLINICAL DATA:  Provided history: Syncope.  Dizziness. Falls. EXAM: PORTABLE CHEST 1 VIEW COMPARISON:  Chest radiographs 01/26/2021. FINDINGS: Heart size within normal limits. No appreciable airspace consolidation. No evidence of pleural effusion or pneumothorax. No acute osseous abnormality identified. IMPRESSION: No evidence of active cardiopulmonary disease. Electronically Signed   By: Jackey LogeKyle  Golden D.O.   On: 05/28/2022 10:35    Procedures Procedures    Medications Ordered in ED Medications  0.9 %  sodium chloride infusion ( Intravenous New Bag/Given 05/28/22 1146)  lidocaine (XYLOCAINE) 2 % (with pres) injection 200 mg (has no administration in time range)  sodium chloride 0.9 % bolus 500 mL (0 mLs Intravenous Stopped 05/28/22 1151)  ondansetron (ZOFRAN) injection 4 mg (4 mg Intravenous Given 05/28/22 1100)  lidocaine-EPINEPHrine-tetracaine (LET) topical gel (3 mLs Topical Given 05/28/22 1150)  acetaminophen (TYLENOL) tablet 1,000 mg (1,000 mg Oral Given 05/28/22 1518)    ED Course/ Medical Decision Making/ A&P                             Medical Decision Making Amount  and/or Complexity of Data Reviewed Labs: ordered. Radiology: ordered.  Risk OTC  drugs. Prescription drug management.   Wound repair done by physician assistant.  See their note for the wound repair.  Patient's CT head face and neck without any brain injury or skull fracture or any facial bony injuries.  There was evidence of some adenopathy.  Which patient made aware of and will need follow-up.  But no evidence of any significant abscess or significant swelling.  CBC white count 6.5.  Coming along with her viral upper respiratory infection.  Hemoglobin 12.4.  Platelets 311.  Complete metabolic panel significant for potassium of 2.9.  Creatinine 1.37.  4 GFR 52 which goes along with being dehydrated.  Probably the result of the syncopal episode.  Patient received bolus of IV fluids here and hydration.  Patient now well-hydrated.  Blood pressure is up to 130s systolic.  Patient feeling better.  Patient had negative strep test and negative COVID and flu test at the urgent care 2 days ago.  Patient will be treated with supplemental potassium for the potassium of 2.9.  Patient given work note.  Patient will need follow-up with her primary care doctor to recheck the potassium to follow-up on the CT scanned adenopathy results.  Also for suture removal in 5 to 7 days.  Also it is possible based on the nature of the fall patient could have a concussion.  Follow-up with primary care doctor important for that.  Will treat with Motrin extra strength Tylenol antibiotic ointment to the wounds.  Patient stable for discharge home.   Final Clinical Impression(s) / ED Diagnoses Final diagnoses:  Syncope and collapse  Dehydration  Injury of head, initial encounter  Facial laceration, initial encounter  Hypokalemia  Influenza-like illness    Rx / DC Orders ED Discharge Orders          Ordered    potassium chloride SA (KLOR-CON M) 20 MEQ tablet  2 times daily        05/28/22 1508    ibuprofen  (ADVIL) 800 MG tablet  3 times daily        05/28/22 1526    acetaminophen (TYLENOL) 500 MG tablet  Every 8 hours PRN        05/28/22 1526              Vanetta Mulders, MD 05/28/22 1548

## 2022-05-28 NOTE — Discharge Instructions (Addendum)
Make an appointment to follow-up with your primary care doctor suture removal for the facial lacerations in 5 to 7 days.  Recommend taking full-strength Motrin 800 mg every 8 hours and extra strength Tylenol 2 tablets every 8 hours as needed.  At the urgent care your strep was negative and COVID and flu was negative.  Workup here today negative for anything of significance from a trauma standpoint however your CT did show some enlarged lymph nodes in the oropharynx area that will need to be followed up by your primary.  If they do not improve may require follow-up CT studies or biopsy.  Probably were dehydrated today and that led to the syncope.  Recommend drinking fluids frequently and Gatorade would be ideal.  Your potassium was a little low today.  The Gatorade will help with that.  Prescription sent to your pharmacy for low potassium.  Take the potassium pills over the next 2 days.  Recommend that your primary care doctor recheck your potassium when you see him or her in a week or 2.  Based on your injury it is possible that you could have a concussion.  Clinical symptoms would be persistent headache dizziness blurred vision or nausea.  Follow-up with your primary care doctor for this.

## 2022-05-28 NOTE — ED Provider Notes (Signed)
LACERATION REPAIR Performed by: Langston Masker Authorized by: Langston Masker Consent: Verbal consent obtained. Risks and benefits: risks, benefits and alternatives were discussed Consent given by: patient Patient identity confirmed: provided demographic data Prepped and Draped in normal sterile fashion Wound explored  Laceration Location: right eyelid Laceration Length: 71mm laceration eyelid and 2cm laceration   No Foreign Bodies seen or palpated  Anesthesia: local infiltration  Local anesthetic: lidocaine 2 percent  Anesthetic total: 56ml   Irrigation method: syringe Amount of cleaning: standard  Skin closure: sutures  Number of sutures: 7  Technique: simple  Patient tolerance: Patient tolerated the procedure well with no immediate complications.    Elson Areas, Cordelia Poche 05/28/22 1238    Vanetta Mulders, MD 05/28/22 [phone]

## 2022-06-04 ENCOUNTER — Encounter (HOSPITAL_COMMUNITY): Payer: Self-pay

## 2022-06-04 ENCOUNTER — Ambulatory Visit (HOSPITAL_COMMUNITY)
Admission: EM | Admit: 2022-06-04 | Discharge: 2022-06-04 | Disposition: A | Discharge status: Home / Self Care | Payer: Medicaid Other

## 2022-06-04 DIAGNOSIS — S01111D Laceration without foreign body of right eyelid and periocular area, subsequent encounter: Secondary | ICD-10-CM

## 2022-06-04 DIAGNOSIS — Z4802 Encounter for removal of sutures: Secondary | ICD-10-CM | POA: Diagnosis not present

## 2022-06-04 NOTE — Discharge Instructions (Signed)
Please monitor for any signs of infection such as increased swelling, pain, drainage.  Return if needed. You can wash your face normally

## 2022-06-04 NOTE — ED Triage Notes (Signed)
Pt is here for suture removal on right eyebrow. Pt stated 7 suture were placed.

## 2022-06-04 NOTE — ED Provider Notes (Signed)
[facility]    CSN: [id number] Arrival date & time: 06/04/22  1106      History   Chief Complaint Chief Complaint  Patient presents with   Suture / Staple Removal    HPI Jody Palmer is a 32 y.o. female.  Here for suture removal She fainted 4/5 and had 7 sutures placed in the right eyebrow, in the ED Healing well, denies any worsening swelling, pain, drainage  Past Medical History:  Diagnosis Date   Asthma    Depression    Gastroschisis    Heart murmur    at birth, no problems since   History of gestational hypertension 06/30/2017   IOL for [facility] 2016   Hypertension    SBO (small bowel obstruction)    this was during her first year of life, no problems since   Trichomonas infection     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   History of shoulder dystocia in prior pregnancy 06/30/2017   Supervision of high risk pregnancy, antepartum 06/30/2017   Marijuana abuse 06/30/2017   History of major abdominal surgery 06/30/2017    Past Surgical History:  Procedure Laterality Date   ABDOMINAL SURGERY     GASTROSCHISIS CLOSURE  1991   TONSILLECTOMY      OB History     Gravida  7   Para  2   Term  2   Preterm      AB  5   Living  2      SAB  1   IAB  4   Ectopic      Multiple  0   Live Births  2            Home Medications    Prior to Admission medications   Medication Sig Start Date End Date Taking? Authorizing Provider  amLODipine (NORVASC) 5 MG tablet Take 1 tablet (5 mg total) by mouth daily. 11/13/18  Yes Myra Rude, MD  acetaminophen (TYLENOL) 500 MG tablet Take 2 tablets (1,000 mg total) by mouth every 8 (eight) hours as needed. 05/28/22   Vanetta Mulders, MD  albuterol (VENTOLIN HFA) 108 (90 Base) MCG/ACT inhaler Inhale 1-2 puffs into the lungs every 6 (six)  hours as needed for wheezing or shortness of breath. 11/16/21   Carlisle Beers, FNP  atenolol-chlorthalidone (TENORETIC) 50-25 MG tablet Take 1 tablet by mouth daily.    [provider]  benzonatate (TESSALON) 100 MG capsule Take 1 capsule (100 mg total) by mouth every 8 (eight) hours. 11/16/21   Carlisle Beers, FNP  cetirizine (ZYRTEC ALLERGY) 10 MG tablet Take 1 tablet (10 mg total) by mouth daily. Patient not taking: Reported on 03/10/2021 06/09/20   Particia Nearing, PA-C  fluticasone [facility]) 50 MCG/ACT nasal spray Place 1 spray into both nostrils in the morning and at bedtime. Patient not taking: Reported on 03/24/2021 06/09/20   Particia Nearing, PA-C  gentamicin (GARAMYCIN) 0.3 % ophthalmic solution Place 2 drops into both eyes every 4 (four) hours. 11/16/21   Carlisle Beers, FNP  guaiFENesin (MUCINEX) 600 MG 12 hr tablet Take 2 tablets (1,200 mg total) by mouth 2 (two) times daily. Patient not taking: Reported on 01/27/2022 11/16/21   Carlisle Beers, FNP  ibuprofen (ADVIL) 600 MG tablet Take 1 tablet (600 mg total) by mouth every 6 (six)  hours as needed. 11/16/21   Carlisle Beers, FNP  ibuprofen (ADVIL) 800 MG tablet Take 1 tablet (800 mg total) by mouth 3 (three) times daily. 05/28/22   Vanetta Mulders, MD  ipratropium (ATROVENT) 0.03 % nasal spray Place 2 sprays into both nostrils every 12 (twelve) hours. 05/26/22   White, Elita Boone, NP  lisinopril (ZESTRIL) 5 MG tablet Take 1 tablet (5 mg total) by mouth daily. Patient not taking: Reported on 07/08/2021 03/13/19   Thressa Sheller D, CNM  magic mouthwash (lidocaine, diphenhydrAMINE, alum & mag hydroxide) suspension Swish and swallow 5 mLs 4 (four) times daily as needed for mouth pain. 05/26/22   Valinda Hoar, NP  meclizine (ANTIVERT) 12.5 MG tablet Take 1 tablet (12.5 mg total) by mouth 3 (three) times daily as needed for dizziness. 05/26/22   White, Elita Boone, NP  metroNIDAZOLE (FLAGYL) 500 MG  tablet Take 1 tablet (500 mg total) by mouth 2 (two) times daily. Patient not taking: Reported on 05/12/2022 01/28/22   Hermina Staggers, MD  metroNIDAZOLE (FLAGYL) 500 MG tablet Take 1 tablet (500 mg total) by mouth 2 (two) times daily. 05/14/22   Constant, Peggy, MD  potassium chloride SA (KLOR-CON M) 20 MEQ tablet Take 1 tablet (20 mEq total) by mouth 2 (two) times daily. 05/28/22   Vanetta Mulders, MD  Prenatal Vit-Fe Fumarate-FA (PREPLUS) 27-1 MG TABS Take 1 tablet by mouth daily. Patient not taking: Reported on 02/07/2018 06/30/17   [place] Bing, MD  promethazine-dextromethorphan (PROMETHAZINE-DM) 6.25-15 MG/5ML syrup Take 5 mLs by mouth 4 (four) times daily as needed for cough. Patient not taking: Reported on 01/27/2022 11/26/21   Zenia Resides, MD  triamcinolone cream (KENALOG) 0.1 % Apply 1 Application topically 2 (two) times daily. To affected area till better 11/26/21   Zenia Resides, MD  trimethoprim-polymyxin b (POLYTRIM) ophthalmic solution Place 2 drops into both eyes every 4 (four) hours. While awake for 5 days 11/26/21   Zenia Resides, MD    Family History Family History  Problem Relation Age of Onset   Diabetes Father    Hypertension Father    Healthy Mother     Social History Social History   Tobacco Use   Smoking status: Some Days    Types: Cigars    Passive exposure: Never   Smokeless tobacco: Never   Tobacco comments:    rarely smokes black & milds  Vaping Use   Vaping Use: Never used  Substance Use Topics   Alcohol use: Not Currently    Comment: occ   Drug use: Yes    Types: Marijuana    Comment: last use May 2015     Allergies   Patient has no known allergies.   Review of Systems Review of Systems Per HPI  Physical Exam Triage Vital Signs ED Triage Vitals [06/04/22 1231]  Enc Vitals Group     BP (!) 133/98     Pulse Rate 78     Resp 18     Temp 98 F (36.7 C)     Temp Source Oral     SpO2 96 %     Weight      Height       Head Circumference      Peak Flow      Pain Score      Pain Loc      Pain Edu?      Excl. in GC?    No data found.  Updated Vital  Signs BP (!) 133/98 (BP Location: Left Arm)   Pulse 78   Temp 98 F (36.7 C) (Oral)   Resp 18   SpO2 96%   Physical Exam Vitals and nursing note reviewed.  Constitutional:      General: She is not in acute distress. HENT:     Head:     Comments: 7 sutures in the right eyebrow.  No swelling or drainage.  Appear to be healing well.    Mouth/Throat:     Pharynx: Oropharynx is clear.  Pulmonary:     Effort: Pulmonary effort is normal.  Musculoskeletal:     Cervical back: Normal range of motion.  Skin:    General: Skin is warm and dry.  Neurological:     Mental Status: She is alert and oriented to person, place, and time.     UC Treatments / Results  Labs (all labs ordered are listed, but only abnormal results are displayed) Labs Reviewed - No data to display  EKG   Radiology No results found.  Procedures Procedures (including critical care time)  Medications Ordered in UC Medications - No data to display  Initial Impression / Assessment and Plan / UC Course  I have reviewed the triage vital signs and the nursing notes.  Pertinent labs & imaging results that were available during my care of the patient were reviewed by me and considered in my medical decision making (see chart for details).  Sutures removed by CMA Discussed signs to look for that warrant return.  Patient agreeable with plan, no questions at this time  Final Clinical Impressions(s) / UC Diagnoses   Final diagnoses:  Visit for suture removal  Laceration of right eyebrow, subsequent encounter     Discharge Instructions      Please monitor for any signs of infection such as increased swelling, pain, drainage.  Return if needed. You can wash your face normally     ED Prescriptions   None    PDMP not reviewed this encounter.   Ladamien Rammel, Ray Church 06/04/22 1259

## 2022-06-12 ENCOUNTER — Emergency Department (HOSPITAL_COMMUNITY)
Admission: EM | Admit: 2022-06-12 | Discharge: 2022-06-12 | Disposition: A | Discharge status: Home / Self Care | Payer: Medicaid Other | Attending: Emergency Medicine | Admitting: Emergency Medicine

## 2022-06-12 ENCOUNTER — Encounter (HOSPITAL_COMMUNITY): Payer: Self-pay

## 2022-06-12 ENCOUNTER — Other Ambulatory Visit: Payer: Self-pay

## 2022-06-12 ENCOUNTER — Emergency Department (HOSPITAL_COMMUNITY): Payer: Medicaid Other

## 2022-06-12 DIAGNOSIS — Z79899 Other long term (current) drug therapy: Secondary | ICD-10-CM | POA: Diagnosis not present

## 2022-06-12 DIAGNOSIS — J45909 Unspecified asthma, uncomplicated: Secondary | ICD-10-CM | POA: Diagnosis not present

## 2022-06-12 DIAGNOSIS — Z7951 Long term (current) use of inhaled steroids: Secondary | ICD-10-CM | POA: Insufficient documentation

## 2022-06-12 DIAGNOSIS — I10 Essential (primary) hypertension: Secondary | ICD-10-CM | POA: Insufficient documentation

## 2022-06-12 DIAGNOSIS — N898 Other specified noninflammatory disorders of vagina: Secondary | ICD-10-CM | POA: Diagnosis present

## 2022-06-12 DIAGNOSIS — R109 Unspecified abdominal pain: Secondary | ICD-10-CM | POA: Diagnosis not present

## 2022-06-12 LAB — URINALYSIS, ROUTINE W REFLEX MICROSCOPIC
Bilirubin Urine: NEGATIVE
Glucose, UA: NEGATIVE mg/dL
Hgb urine dipstick: NEGATIVE
Ketones, ur: NEGATIVE mg/dL
Leukocytes,Ua: NEGATIVE
Nitrite: NEGATIVE
Protein, ur: NEGATIVE mg/dL
Specific Gravity, Urine: 1.028 (ref 1.005–1.030)
pH: 5 (ref 5.0–8.0)

## 2022-06-12 LAB — I-STAT CHEM 8, ED
BUN: 22 mg/dL — ABNORMAL HIGH (ref 6–20)
Calcium, Ion: 1.17 mmol/L (ref 1.15–1.40)
Chloride: 102 mmol/L (ref 98–111)
Creatinine, Ser: 0.8 mg/dL (ref 0.44–1.00)
Glucose, Bld: 90 mg/dL (ref 70–99)
HCT: 39 % (ref 36.0–46.0)
Hemoglobin: 13.3 g/dL (ref 12.0–15.0)
Potassium: 3.6 mmol/L (ref 3.5–5.1)
Sodium: 138 mmol/L (ref 135–145)
TCO2: 28 mmol/L (ref 22–32)

## 2022-06-12 LAB — PREGNANCY, URINE: Preg Test, Ur: NEGATIVE

## 2022-06-12 MED ORDER — HYDROCODONE-ACETAMINOPHEN 5-325 MG PO TABS
1.0000 | ORAL_TABLET | Freq: Once | ORAL | Status: AC
  Administered 2022-06-12: 1 via ORAL
  Filled 2022-06-12: qty 1

## 2022-06-12 MED ORDER — IOHEXOL 350 MG/ML SOLN
75.0000 mL | Freq: Once | INTRAVENOUS | Status: AC | PRN
  Administered 2022-06-12: 75 mL via INTRAVENOUS

## 2022-06-12 MED ORDER — NAPROXEN 500 MG PO TABS: 500.0000 mg | ORAL_TABLET | Freq: Two times a day (BID) | ORAL | 0 refills | Status: AC

## 2022-06-12 NOTE — ED Triage Notes (Addendum)
Patient complains of raised painful area to labia x 2 days. Denies drainage. Denies dysuria. Patient alert and oriented. Lmp 2 weeks ago

## 2022-06-12 NOTE — Discharge Instructions (Signed)
You were evaluated today for a vaginal mass. This does not appear to be an abscess or cyst. Your CT scan was reassuring. Please follow up with OB/GYN for further evaluation.  I have prescribed antiinflammatory medicine. Please take as directed.

## 2022-06-12 NOTE — ED Provider Notes (Signed)
[place] EMERGENCY DEPARTMENT AT [facility] Provider Note   CSN: [id number] Arrival date & time: 06/12/22  4098     History  Chief Complaint  Patient presents with   vaginal discomfort    Genessis Flanary is a 32 y.o. female.  Patient presents to the emergency department complaining to a painful bulge in the area noticed in the vaginal area which has been developing over the past 2 days.  Patient denies drainage from the area.  She denies fevers, nausea, vomiting.  She does endorse pain with palpation of the area.  Patient reports her last menstrual period was 2 weeks ago.  Past medical history significant for SBO, heart murmur, hypertension, asthma HPI     Home Medications Prior to Admission medications   Medication Sig Start Date End Date Taking? Authorizing Provider  naproxen (NAPROSYN) 500 MG tablet Take 1 tablet (500 mg total) by mouth 2 (two) times daily. 06/12/22  Yes Darrick Grinder, PA-C  acetaminophen (TYLENOL) 500 MG tablet Take 2 tablets (1,000 mg total) by mouth every 8 (eight) hours as needed. 05/28/22   Vanetta Mulders, MD  albuterol (VENTOLIN HFA) 108 (90 Base) MCG/ACT inhaler Inhale 1-2 puffs into the lungs every 6 (six) hours as needed for wheezing or shortness of breath. 11/16/21   Carlisle Beers, FNP  amLODipine (NORVASC) 5 MG tablet Take 1 tablet (5 mg total) by mouth daily. 11/13/18   Myra Rude, MD  atenolol-chlorthalidone (TENORETIC) 50-25 MG tablet Take 1 tablet by mouth daily.    [provider]  benzonatate (TESSALON) 100 MG capsule Take 1 capsule (100 mg total) by mouth every 8 (eight) hours. 11/16/21   Carlisle Beers, FNP  cetirizine (ZYRTEC ALLERGY) 10 MG tablet Take 1 tablet (10 mg total) by mouth daily. Patient not taking: Reported on 03/10/2021 06/09/20   Particia Nearing, PA-C  fluticasone [facility]) 50 MCG/ACT nasal spray Place 1 spray into both nostrils in the morning and at bedtime. Patient not taking:  Reported on 03/24/2021 06/09/20   Particia Nearing, PA-C  gentamicin (GARAMYCIN) 0.3 % ophthalmic solution Place 2 drops into both eyes every 4 (four) hours. 11/16/21   Carlisle Beers, FNP  guaiFENesin (MUCINEX) 600 MG 12 hr tablet Take 2 tablets (1,200 mg total) by mouth 2 (two) times daily. Patient not taking: Reported on 01/27/2022 11/16/21   Carlisle Beers, FNP  ipratropium (ATROVENT) 0.03 % nasal spray Place 2 sprays into both nostrils every 12 (twelve) hours. 05/26/22   White, Elita Boone, NP  lisinopril (ZESTRIL) 5 MG tablet Take 1 tablet (5 mg total) by mouth daily. Patient not taking: Reported on 07/08/2021 03/13/19   Thressa Sheller D, CNM  magic mouthwash (lidocaine, diphenhydrAMINE, alum & mag hydroxide) suspension Swish and swallow 5 mLs 4 (four) times daily as needed for mouth pain. 05/26/22   Valinda Hoar, NP  meclizine (ANTIVERT) 12.5 MG tablet Take 1 tablet (12.5 mg total) by mouth 3 (three) times daily as needed for dizziness. 05/26/22   White, Elita Boone, NP  metroNIDAZOLE (FLAGYL) 500 MG tablet Take 1 tablet (500 mg total) by mouth 2 (two) times daily. Patient not taking: Reported on 05/12/2022 01/28/22   Hermina Staggers, MD  metroNIDAZOLE (FLAGYL) 500 MG tablet Take 1 tablet (500 mg total) by mouth 2 (two) times daily. 05/14/22   Constant, Peggy, MD  potassium chloride SA (KLOR-CON M) 20 MEQ tablet Take 1 tablet (20 mEq total) by mouth 2 (two) times daily.  05/28/22   Vanetta Mulders, MD  Prenatal Vit-Fe Fumarate-FA (PREPLUS) 27-1 MG TABS Take 1 tablet by mouth daily. Patient not taking: Reported on 02/07/2018 06/30/17   [place]  Bing, MD  promethazine-dextromethorphan (PROMETHAZINE-DM) 6.25-15 MG/5ML syrup Take 5 mLs by mouth 4 (four) times daily as needed for cough. Patient not taking: Reported on 01/27/2022 11/26/21   Zenia Resides, MD  triamcinolone cream (KENALOG) 0.1 % Apply 1 Application topically 2 (two) times daily. To affected area till better 11/26/21    Zenia Resides, MD  trimethoprim-polymyxin b (POLYTRIM) ophthalmic solution Place 2 drops into both eyes every 4 (four) hours. While awake for 5 days 11/26/21   Zenia Resides, MD      Allergies    Patient has no known allergies.    Review of Systems   Review of Systems  Physical Exam Updated Vital Signs BP (!) 144/91   Pulse 76   Temp 98.1 F (36.7 C) (Oral)   Resp 19   SpO2 100%  Physical Exam Vitals and nursing note reviewed. Exam conducted with a chaperone present.  Constitutional:      General: She is not in acute distress.    Appearance: She is well-developed.  HENT:     Head: Normocephalic and atraumatic.  Eyes:     Conjunctiva/sclera: Conjunctivae normal.  Cardiovascular:     Rate and Rhythm: Normal rate and regular rhythm.     Heart sounds: No murmur heard. Pulmonary:     Effort: Pulmonary effort is normal. No respiratory distress.     Breath sounds: Normal breath sounds.  Abdominal:     Palpations: Abdomen is soft.     Tenderness: There is no abdominal tenderness.  Genitourinary:   Musculoskeletal:        General: No swelling.     Cervical back: Neck supple.  Skin:    General: Skin is warm and dry.     Capillary Refill: Capillary refill takes less than 2 seconds.  Neurological:     Mental Status: She is alert.  Psychiatric:        Mood and Affect: Mood normal.     ED Results / Procedures / Treatments   Labs (all labs ordered are listed, but only abnormal results are displayed) Labs Reviewed  URINALYSIS, ROUTINE W REFLEX MICROSCOPIC - Abnormal; Notable for the following components:      Result Value   APPearance HAZY (*)    All other components within normal limits  I-STAT CHEM 8, ED - Abnormal; Notable for the following components:   BUN 22 (*)    All other components within normal limits  PREGNANCY, URINE    EKG None  Radiology CT PELVIS W CONTRAST  Result Date: 06/12/2022 CLINICAL DATA:  32 year old female with vaginal  swelling "concern for abscess versus fistula". Raised and painful area of the labia for 2 days. EXAM: CT PELVIS WITH CONTRAST TECHNIQUE: Multidetector CT imaging of the pelvis was performed using the standard protocol following the bolus administration of intravenous contrast. RADIATION DOSE REDUCTION: This exam was performed according to the departmental dose-optimization program which includes automated exposure control, adjustment of the mA and/or kV according to patient size and/or use of iterative reconstruction technique. CONTRAST:  75mL OMNIPAQUE IOHEXOL 350 MG/ML SOLN COMPARISON:  None Available. FINDINGS: Urinary Tract: Kidneys not included. No evidence of distal hydroureter. Unremarkable urinary bladder. Small left lateral pelvic phleboliths series 3, image 30. Bowel: Mild retained stool in the rectum. No perirectal or perianal inflammation identified. Decompressed  distal sigmoid colon. Partially visible redundant proximal sigmoid located in the right abdomen. Transverse colon retained stool. Descending colon not readily identified, query previous partial colectomy. No dilated small bowel. No free air or free fluid identified. Vascular/Lymphatic: Suboptimal intravascular contrast bolus but the major arterial structures appear patent and unremarkable. No lymphadenopathy identified. Reproductive: Unremarkable CT appearance of the labia, no peroneal fat stranding or discrete inflammation. Trace gas in the vagina, otherwise unremarkable. Fibroid uterus. Dominant dorsal calcified uterine fibroid is 2.4 cm. Both ovaries are within normal limits. No gas within the endometrial cavity. Other:  No pelvis free fluid. Musculoskeletal: No osseous abnormality identified. Probable chronic postoperative changes to the ventral abdominal wall, asymmetric right rectus muscle atrophy. IMPRESSION: 1. Essentially negative CT appearance of the Pelvis except for Fibroid uterus. No perineum, perianal inflammation or vaginal  abnormality identified. 2. Probable chronic postoperative changes to the ventral abdominal wall. Electronically Signed   By: Odessa Fleming M.D.   On: 06/12/2022 10:58    Procedures Procedures    Medications Ordered in ED Medications  HYDROcodone-acetaminophen (NORCO/VICODIN) 5-325 MG per tablet 1 tablet (has no administration in time range)  iohexol (OMNIPAQUE) 350 MG/ML injection 75 mL (75 mLs Intravenous Contrast Given 06/12/22 1050)    ED Course/ Medical Decision Making/ A&P                             Medical Decision Making Amount and/or Complexity of Data Reviewed Labs: ordered. Radiology: ordered.  Risk Prescription drug management.   This patient presents to the ED for concern of vaginal mass/swelling, this involves an extensive number of treatment options, and is a complaint that carries with it a high risk of complications and morbidity.  The differential diagnosis includes abscess, Bartholin cyst, rectocele, cystocele, fistula, others   Co morbidities that complicate the patient evaluation  Hx HTN    Lab Tests:  I Ordered, and personally interpreted labs.  The pertinent results include: Grossly unremarkable UA, negative pregnancy test, unremarkable i-STAT Chem-8   Imaging Studies ordered:  I ordered imaging studies including CT pelvis with contrast I independently visualized and interpreted imaging which showed  1. Essentially negative CT appearance of the Pelvis except for  Fibroid uterus. No perineum, perianal inflammation or vaginal  abnormality identified.    2. Probable chronic postoperative changes to the ventral abdominal  wall.   I agree with the radiologist interpretation  Problem List / ED Course / Critical interventions / Medication management   I ordered medication including hydrocodone for pain  Reevaluation of the patient after these medicines showed that the patient improved I have reviewed the patients home medicines and have made  adjustments as needed   Social Determinants of Health:  Patient has [employer] for her primary insurance. She does have an established OB/GYN provider   Test / Admission - Considered:  Physical examination not consistent with abscess or Bartholin cyst.  CT pelvis with no signs of fistula or other emergent surgical findings.  Plan to have patient follow-up with OB/GYN for further evaluation of this mass.  Plan to discharge home with course of anti-inflammatory medication.  Return precautions provided        Final Clinical Impression(s) / ED Diagnoses Final diagnoses:  Vaginal mass    Rx / DC Orders ED Discharge Orders          Ordered    naproxen (NAPROSYN) 500 MG tablet  2 times daily  06/12/22 1150              Darrick Grinder, PA-C 06/12/22 1151    Elayne Snare K, DO 06/12/22 [phone]

## 2022-06-16 ENCOUNTER — Ambulatory Visit: Payer: Medicaid Other | Admitting: Obstetrics and Gynecology

## 2022-06-16 VITALS — BP 118/78 | HR 81 | Wt 275.0 lb

## 2022-06-16 DIAGNOSIS — N898 Other specified noninflammatory disorders of vagina: Secondary | ICD-10-CM | POA: Diagnosis not present

## 2022-06-16 NOTE — Progress Notes (Signed)
Pt is in office for follow up to recent ED visit for ?vaginal mass.

## 2022-06-16 NOTE — Progress Notes (Signed)
  CC: vaginal mass Subjective:    Patient ID: Jody Palmer, female    DOB: 08-14-1990, 32 y.o.   MRN: [id number]  HPI 32 yo G7P2 seen as ER follow up for a vaginal mass/cyst.  Pt states it showed up the day after having intercourse.  It is mildly uncomfortable but has become less so over the last few days.  ER physicians were unsure what it was, but did get CT imaging which was also normal.  No sign of infection.   Review of Systems     Objective:   Physical Exam Genitourinary:    Comments: 1.5 cm vaginal wall cyst on the patient's left side.  Firm mass with obvious fluid inside.  Not overly tender.  No sign of infection or extension to other tissues.  Mass is relatively mobile.  Pictures placed in Media section.   Vitals:   06/16/22 1359  BP: 118/78  Pulse: 81   CLINICAL DATA:  32 year old female with vaginal swelling "concern for abscess versus fistula". Raised and painful area of the labia for 2 days.   EXAM: CT PELVIS WITH CONTRAST   TECHNIQUE: Multidetector CT imaging of the pelvis was performed using the standard protocol following the bolus administration of intravenous contrast.   RADIATION DOSE REDUCTION: This exam was performed according to the departmental dose-optimization program which includes automated exposure control, adjustment of the mA and/or kV according to patient size and/or use of iterative reconstruction technique.   CONTRAST:  75mL OMNIPAQUE IOHEXOL 350 MG/ML SOLN   COMPARISON:  None Available.   FINDINGS: Urinary Tract: Kidneys not included. No evidence of distal hydroureter. Unremarkable urinary bladder. Small left lateral pelvic phleboliths series 3, image 30.   Bowel: Mild retained stool in the rectum. No perirectal or perianal inflammation identified. Decompressed distal sigmoid colon. Partially visible redundant proximal sigmoid located in the right abdomen. Transverse colon retained stool. Descending colon not readily identified,  query previous partial colectomy. No dilated small bowel. No free air or free fluid identified.   Vascular/Lymphatic: Suboptimal intravascular contrast bolus but the major arterial structures appear patent and unremarkable. No lymphadenopathy identified.   Reproductive: Unremarkable CT appearance of the labia, no peroneal fat stranding or discrete inflammation.   Trace gas in the vagina, otherwise unremarkable.   Fibroid uterus. Dominant dorsal calcified uterine fibroid is 2.4 cm.   Both ovaries are within normal limits. No gas within the endometrial cavity.   Other:  No pelvis free fluid.   Musculoskeletal: No osseous abnormality identified. Probable chronic postoperative changes to the ventral abdominal wall, asymmetric right rectus muscle atrophy.   IMPRESSION: 1. Essentially negative CT appearance of the Pelvis except for Fibroid uterus. No perineum, perianal inflammation or vaginal abnormality identified.   2. Probable chronic postoperative changes to the ventral abdominal wall.        Assessment & Plan:   1. Vaginal wall cyst Discussion of office attempt for removal or outpatient surgical removal.  Pt desires outpatient surgical removal.  Will post .    Warden Fillers, MD Faculty Attending, [facility]

## 2022-06-24 ENCOUNTER — Telehealth: Payer: Self-pay

## 2022-06-24 NOTE — Telephone Encounter (Signed)
Called patient, no answer, left voicemail explaining I would schedule her on first available 5/14.

## 2022-06-28 DIAGNOSIS — N898 Other specified noninflammatory disorders of vagina: Secondary | ICD-10-CM | POA: Insufficient documentation

## 2022-06-28 NOTE — Progress Notes (Signed)
Patient called for preop call, patient stated that she felt like her cyst had burst on its own. Informed patient she meed to call Dr Laverna Peace office and ask him for further instructions.

## 2022-06-30 NOTE — Progress Notes (Signed)
Called patient to do preop history. She stated that her cyst had ruptured / burst on it's own. She is going to talk with Dr. Donavan Foil and call me back to let me know if she is going to have surgery on 07/06/22.

## 2022-07-01 NOTE — Progress Notes (Signed)
Spoke with Rhea Bleacher at Dr Laverna Peace office, patient stated she was going to contact office about surgery.

## 2022-07-02 ENCOUNTER — Ambulatory Visit: Payer: Medicaid Other | Admitting: Obstetrics and Gynecology

## 2022-07-02 NOTE — Progress Notes (Signed)
Pt Spoke with Dr. Donavan Foil office this morning and pt told them that she is canceling her surgery for Tuesday Jul 06, 2022.

## 2022-07-05 ENCOUNTER — Telehealth: Payer: Self-pay

## 2022-07-05 NOTE — Telephone Encounter (Signed)
Contacted patient to confirm the cancellation of her 07/06/22 procedure. Patient says the cyst resolved on its own, two weeks ago. Patient also states she was unaware a procedure was scheduled until she received a call on Friday, 5/10. Patient says she doesn't need the procedure and would like the cancellation fee waived. She will contact Dr. Donavan Foil with any future issues.

## 2022-07-06 ENCOUNTER — Ambulatory Visit (HOSPITAL_BASED_OUTPATIENT_CLINIC_OR_DEPARTMENT_OTHER)
Admission: RE | Admit: 2022-07-06 | Payer: Medicaid Other | Source: Home / Self Care | Admitting: Obstetrics and Gynecology

## 2022-07-06 DIAGNOSIS — N898 Other specified noninflammatory disorders of vagina: Secondary | ICD-10-CM | POA: Insufficient documentation

## 2022-07-06 SURGERY — EXCISION, CYST, VAGINA
Anesthesia: Choice

## 2022-09-08 ENCOUNTER — Other Ambulatory Visit (HOSPITAL_COMMUNITY)
Admission: RE | Admit: 2022-09-08 | Discharge: 2022-09-08 | Disposition: A | Discharge status: Home / Self Care | Payer: Medicaid Other | Source: Ambulatory Visit | Attending: Obstetrics and Gynecology | Admitting: Obstetrics and Gynecology

## 2022-09-08 ENCOUNTER — Ambulatory Visit: Payer: Medicaid Other | Admitting: *Deleted

## 2022-09-08 VITALS — BP 142/93

## 2022-09-08 DIAGNOSIS — N898 Other specified noninflammatory disorders of vagina: Secondary | ICD-10-CM | POA: Diagnosis present

## 2022-09-08 NOTE — Progress Notes (Signed)
SUBJECTIVE:  32 y.o. female complains of thin vaginal discharge for 1 week(s). Denies abnormal vaginal bleeding or significant pelvic pain or fever. No UTI symptoms. Denies history of known exposure to STD.  No LMP recorded. Patient has had an implant.  OBJECTIVE:  She appears well, afebrile. Urine dipstick: not done.  ASSESSMENT:  Vaginal Discharge  Vaginal Odor   PLAN:  GC, chlamydia, trichomonas, BVAG, CVAG probe sent to lab. Treatment: To be determined once lab results are received ROV prn if symptoms persist or worsen.  Declines STI blood panel.  Pt did not take BP med this am. Advised to f/u with PCP regarding elevated BP.

## 2022-09-09 LAB — CERVICOVAGINAL ANCILLARY ONLY
Bacterial Vaginitis (gardnerella): POSITIVE — AB
Candida Glabrata: NEGATIVE
Candida Vaginitis: NEGATIVE
Chlamydia: NEGATIVE
Comment: NEGATIVE
Comment: NEGATIVE
Comment: NEGATIVE
Comment: NEGATIVE
Comment: NEGATIVE
Comment: NORMAL
Neisseria Gonorrhea: NEGATIVE
Trichomonas: NEGATIVE

## 2022-09-13 ENCOUNTER — Other Ambulatory Visit: Payer: Self-pay

## 2022-09-13 DIAGNOSIS — B9689 Other specified bacterial agents as the cause of diseases classified elsewhere: Secondary | ICD-10-CM

## 2022-09-13 MED ORDER — METRONIDAZOLE 500 MG PO TABS: 500.0000 mg | ORAL_TABLET | Freq: Two times a day (BID) | ORAL | 0 refills | Status: AC

## 2022-09-13 NOTE — Progress Notes (Signed)
Rx sent 

## 2022-10-07 ENCOUNTER — Emergency Department (HOSPITAL_BASED_OUTPATIENT_CLINIC_OR_DEPARTMENT_OTHER)
Admission: EM | Admit: 2022-10-07 | Discharge: 2022-10-07 | Disposition: A | Discharge status: Home / Self Care | Payer: Medicaid Other | Attending: Emergency Medicine | Admitting: Emergency Medicine

## 2022-10-07 ENCOUNTER — Encounter (HOSPITAL_BASED_OUTPATIENT_CLINIC_OR_DEPARTMENT_OTHER): Payer: Self-pay | Admitting: Emergency Medicine

## 2022-10-07 DIAGNOSIS — J45909 Unspecified asthma, uncomplicated: Secondary | ICD-10-CM | POA: Diagnosis not present

## 2022-10-07 DIAGNOSIS — R21 Rash and other nonspecific skin eruption: Secondary | ICD-10-CM | POA: Insufficient documentation

## 2022-10-07 DIAGNOSIS — I10 Essential (primary) hypertension: Secondary | ICD-10-CM | POA: Diagnosis not present

## 2022-10-07 DIAGNOSIS — Z79899 Other long term (current) drug therapy: Secondary | ICD-10-CM | POA: Diagnosis not present

## 2022-10-07 MED ORDER — METHYLPREDNISOLONE 4 MG PO TBPK: ORAL_TABLET | ORAL | 0 refills | Status: AC

## 2022-10-07 NOTE — Discharge Instructions (Addendum)
You were seen in the emergency department today for a rash.  As we discussed your rash looks most consistent with a contact dermatitis.  We did discuss possibility of a viral infection such as hand-foot-and-mouth, but I think this is less likely.  I am prescribing you some steroids to take by mouth.  You can use some hydrocortisone cream over areas that are itching, as well as some calamine lotion to keep the skin hydrated.

## 2022-10-07 NOTE — ED Triage Notes (Signed)
Pt c/o slightly itchy rash to BUE and BLE; sxs started approx 2-5 days ago; tiny red bumps noted sporadically

## 2022-10-07 NOTE — ED Provider Notes (Signed)
[place] [facility] Provider Note   CSN: [id number] Arrival date & time: 10/07/22  1611     History  Chief Complaint  Patient presents with   Rash    Jody Palmer is a 32 y.o. female with history of asthma, hypertension, depression, who presents the emergency department complaining of a rash for the past 5 days or so.  Patient states she initially noticed it on her upper arms, started noticing on her hands, lower legs, and feet.  It has been mildly itchy, but she thinks this also could be related to when she noticed the rash more.  Denies any fever or chills.  Denies any changes in soaps, lotions, detergents.  No recent changes in medications.   Rash      Home Medications Prior to Admission medications   Medication Sig Start Date End Date Taking? Authorizing Provider  methylPREDNISolone (MEDROL DOSEPAK) 4 MG TBPK tablet Take per package instructions 10/07/22  Yes Laray Corbit T, PA-C  acetaminophen (TYLENOL) 500 MG tablet Take 2 tablets (1,000 mg total) by mouth every 8 (eight) hours as needed. Patient not taking: Reported on 09/08/2022 05/28/22   Vanetta Mulders, MD  albuterol (VENTOLIN HFA) 108 (90 Base) MCG/ACT inhaler Inhale 1-2 puffs into the lungs every 6 (six) hours as needed for wheezing or shortness of breath. 11/16/21   Carlisle Beers, FNP  amLODipine (NORVASC) 5 MG tablet Take 1 tablet (5 mg total) by mouth daily. 11/13/18   Myra Rude, MD  atenolol-chlorthalidone (TENORETIC) 50-25 MG tablet Take 1 tablet by mouth daily.    [provider]  benzonatate (TESSALON) 100 MG capsule Take 1 capsule (100 mg total) by mouth every 8 (eight) hours. 11/16/21   Carlisle Beers, FNP  cetirizine (ZYRTEC ALLERGY) 10 MG tablet Take 1 tablet (10 mg total) by mouth daily. 06/09/20   Particia Nearing, PA-C  fluticasone [facility]) 50 MCG/ACT nasal spray Place 1 spray into both nostrils in the morning and at bedtime.  06/09/20   Particia Nearing, PA-C  gentamicin (GARAMYCIN) 0.3 % ophthalmic solution Place 2 drops into both eyes every 4 (four) hours. 11/16/21   Carlisle Beers, FNP  guaiFENesin (MUCINEX) 600 MG 12 hr tablet Take 2 tablets (1,200 mg total) by mouth 2 (two) times daily. Patient not taking: Reported on 01/27/2022 11/16/21   Carlisle Beers, FNP  ipratropium (ATROVENT) 0.03 % nasal spray Place 2 sprays into both nostrils every 12 (twelve) hours. 05/26/22   White, Elita Boone, NP  lisinopril (ZESTRIL) 5 MG tablet Take 1 tablet (5 mg total) by mouth daily. Patient not taking: Reported on 07/08/2021 03/13/19   Armando Reichert, CNM  meclizine (ANTIVERT) 12.5 MG tablet Take 1 tablet (12.5 mg total) by mouth 3 (three) times daily as needed for dizziness. 05/26/22   White, Elita Boone, NP  naproxen (NAPROSYN) 500 MG tablet Take 1 tablet (500 mg total) by mouth 2 (two) times daily. 06/12/22   Darrick Grinder, PA-C  potassium chloride SA (KLOR-CON M) 20 MEQ tablet Take 1 tablet (20 mEq total) by mouth 2 (two) times daily. 05/28/22   Vanetta Mulders, MD  Prenatal Vit-Fe Fumarate-FA (PREPLUS) 27-1 MG TABS Take 1 tablet by mouth daily. 06/30/17   [place] Bing, MD  promethazine-dextromethorphan (PROMETHAZINE-DM) 6.25-15 MG/5ML syrup Take 5 mLs by mouth 4 (four) times daily as needed for cough. Patient not taking: Reported on 01/27/2022 11/26/21   Zenia Resides, MD  triamcinolone cream (KENALOG)  0.1 % Apply 1 Application topically 2 (two) times daily. To affected area till better 11/26/21   Zenia Resides, MD  trimethoprim-polymyxin b (POLYTRIM) ophthalmic solution Place 2 drops into both eyes every 4 (four) hours. While awake for 5 days 11/26/21   Zenia Resides, MD      Allergies    Patient has no known allergies.    Review of Systems   Review of Systems  Skin:  Positive for rash.  All other systems reviewed and are negative.   Physical Exam Updated Vital Signs BP (!) 137/94   Pulse  71   Temp 97.9 F (36.6 C)   Resp 16   Ht 5\' 9"  (1.753 m)   Wt 123.4 kg   SpO2 99%   BMI 40.17 kg/m  Physical Exam Vitals and nursing note reviewed.  Constitutional:      Appearance: Normal appearance.  HENT:     Head: Normocephalic and atraumatic.  Eyes:     Conjunctiva/sclera: Conjunctivae normal.  Pulmonary:     Effort: Pulmonary effort is normal. No respiratory distress.  Skin:    General: Skin is warm and dry.     Comments: Skin colored papular lesions noted to the palms of the hand, flat hyperpigmented lesions on the soles of the feet, some scattered papular lesions to the trunk, bilateral arms and legs.  Not cellulitic.  No skin breakdown.  Neurological:     Mental Status: She is alert.  Psychiatric:        Mood and Affect: Mood normal.        Behavior: Behavior normal.     ED Results / Procedures / Treatments   Labs (all labs ordered are listed, but only abnormal results are displayed) Labs Reviewed - No data to display  EKG None  Radiology No results found.  Procedures Procedures    Medications Ordered in ED Medications - No data to display  ED Course/ Medical Decision Making/ A&P                                 Medical Decision Making Risk Prescription drug management.   Patient is a 32 year old female with history of f asthma, hypertension, depression, who presents the emergency department with a rash.  On exam patient has a nonspecific skin eruption.  There are multiple areas of different lesions.  There are some lesions noted to the trunk, extremities, soles of the feet, palms of the hands.  The rash overall looks most consistent with a contact dermatitis.  I did discuss the possibility of a viral infection such as hand-foot-and-mouth, or monkeypox with the patient.  She has no history of immunosuppression.  No history of IV drug use.  I have very low concern for viral infection as the etiology of her symptoms.  She has no other symptoms, is  otherwise feeling well.  Will recommend she continue topical treatment such as calamine lotion and hydrocortisone cream.  Will prescribe Medrol Dosepak, and recommend close follow-up.  Rash not consistent with emergent etiology.  Feel she stable for discharge to home.  Final Clinical Impression(s) / ED Diagnoses Final diagnoses:  Rash and nonspecific skin eruption    Rx / DC Orders ED Discharge Orders          Ordered    methylPREDNISolone (MEDROL DOSEPAK) 4 MG TBPK tablet        10/07/22 1919  Portions of this report may have been transcribed using voice recognition software. Every effort was made to ensure accuracy; however, inadvertent computerized transcription errors may be present.    Jeanella Flattery 10/07/22 Georgian Co, MD 10/07/22 2340

## 2022-12-14 ENCOUNTER — Ambulatory Visit: Payer: Medicaid Other | Admitting: Emergency Medicine

## 2022-12-14 ENCOUNTER — Other Ambulatory Visit (HOSPITAL_COMMUNITY)
Admission: RE | Admit: 2022-12-14 | Discharge: 2022-12-14 | Disposition: A | Discharge status: Home / Self Care | Payer: Medicaid Other | Source: Ambulatory Visit | Attending: Obstetrics and Gynecology | Admitting: Obstetrics and Gynecology

## 2022-12-14 VITALS — BP 142/89 | HR 73 | Ht 69.0 in | Wt 273.3 lb

## 2022-12-14 DIAGNOSIS — N898 Other specified noninflammatory disorders of vagina: Secondary | ICD-10-CM | POA: Insufficient documentation

## 2022-12-14 NOTE — Progress Notes (Signed)
SUBJECTIVE:  32 y.o. female complains of clear vaginal discharge and odor for 3 day(s). Denies abnormal vaginal bleeding or significant pelvic pain or fever. No UTI symptoms. Denies history of known exposure to STD.  No LMP recorded. Patient has had an implant.  OBJECTIVE:  She appears well, afebrile. Urine dipstick: not done.  ASSESSMENT:  Vaginal Discharge  Vaginal Odor   PLAN:  GC, chlamydia, trichomonas, BVAG, CVAG probe sent to lab. Treatment: To be determined once lab results are received ROV prn if symptoms persist or worsen.

## 2022-12-16 LAB — CERVICOVAGINAL ANCILLARY ONLY
Bacterial Vaginitis (gardnerella): POSITIVE — AB
Chlamydia: NEGATIVE
Comment: NEGATIVE
Comment: NEGATIVE
Comment: NEGATIVE
Comment: NORMAL
Neisseria Gonorrhea: NEGATIVE
Trichomonas: NEGATIVE

## 2022-12-17 MED ORDER — METRONIDAZOLE 500 MG PO TABS
500.0000 mg | ORAL_TABLET | Freq: Two times a day (BID) | ORAL | 0 refills | Status: DC
Start: 2022-12-17 — End: 2023-04-29

## 2022-12-17 NOTE — Addendum Note (Signed)
Addended by: Catalina Antigua on: 12/17/2022 08:07 AM   Modules accepted: Orders

## 2022-12-21 ENCOUNTER — Ambulatory Visit: Payer: Medicaid Other

## 2023-02-08 ENCOUNTER — Ambulatory Visit: Payer: Medicaid Other

## 2023-04-28 ENCOUNTER — Ambulatory Visit

## 2023-04-28 ENCOUNTER — Other Ambulatory Visit (HOSPITAL_COMMUNITY)
Admission: RE | Admit: 2023-04-28 | Discharge: 2023-04-28 | Disposition: A | Discharge status: Home / Self Care | Source: Ambulatory Visit | Attending: Obstetrics and Gynecology | Admitting: Obstetrics and Gynecology

## 2023-04-28 VITALS — BP 150/98 | HR 100

## 2023-04-28 DIAGNOSIS — N898 Other specified noninflammatory disorders of vagina: Secondary | ICD-10-CM | POA: Diagnosis present

## 2023-04-28 NOTE — Progress Notes (Signed)
 SUBJECTIVE:  33 y.o. female complains of vaginal odor for 5 day(s). Denies abnormal vaginal bleeding or significant pelvic pain or fever. No UTI symptoms. Denies history of known exposure to STD.  No LMP recorded. Patient has had an implant.  OBJECTIVE:  She appears well, afebrile. Urine dipstick: not done.  ASSESSMENT:  Vaginal Discharge  Vaginal Odor   PLAN:  GC, chlamydia, trichomonas, BVAG, CVAG probe sent to lab. Treatment: To be determined once lab results are received ROV prn if symptoms persist or worsen. Pt aware BP high, on BP medications

## 2023-04-29 ENCOUNTER — Other Ambulatory Visit: Payer: Self-pay | Admitting: Obstetrics and Gynecology

## 2023-04-29 ENCOUNTER — Encounter: Payer: Self-pay | Admitting: Obstetrics and Gynecology

## 2023-04-29 DIAGNOSIS — B9689 Other specified bacterial agents as the cause of diseases classified elsewhere: Secondary | ICD-10-CM

## 2023-04-29 LAB — CERVICOVAGINAL ANCILLARY ONLY
Bacterial Vaginitis (gardnerella): POSITIVE — AB
Candida Glabrata: NEGATIVE
Candida Vaginitis: NEGATIVE
Chlamydia: NEGATIVE
Comment: NEGATIVE
Comment: NEGATIVE
Comment: NEGATIVE
Comment: NEGATIVE
Comment: NEGATIVE
Comment: NORMAL
Neisseria Gonorrhea: NEGATIVE
Trichomonas: NEGATIVE

## 2023-04-29 MED ORDER — METRONIDAZOLE 500 MG PO TABS: 500.0000 mg | ORAL_TABLET | Freq: Two times a day (BID) | ORAL | 0 refills | Status: DC

## 2023-05-13 ENCOUNTER — Ambulatory Visit: Admitting: Obstetrics and Gynecology

## 2023-10-19 ENCOUNTER — Other Ambulatory Visit (HOSPITAL_COMMUNITY)
Admission: RE | Admit: 2023-10-19 | Discharge: 2023-10-19 | Disposition: A | Discharge status: Home / Self Care | Source: Ambulatory Visit | Attending: Obstetrics and Gynecology | Admitting: Obstetrics and Gynecology

## 2023-10-19 ENCOUNTER — Ambulatory Visit

## 2023-10-19 VITALS — BP 160/106 | HR 74 | Wt 282.0 lb

## 2023-10-19 DIAGNOSIS — N898 Other specified noninflammatory disorders of vagina: Secondary | ICD-10-CM | POA: Diagnosis present

## 2023-10-19 NOTE — Progress Notes (Signed)
..  SUBJECTIVE:  33 y.o. female complains of  vaginal discharge for 4 day(s). Denies abnormal vaginal bleeding or significant pelvic pain or fever. No UTI symptoms. Denies history of known exposure to STD. Pt also reports that BP has been elevated because she has not taken medication in 4 days due to not picking up RX from pharmacy yet. Pt denies any HA, dizziness, or visual changes and stated that she will get rx today.   No LMP recorded. Patient has had an implant.  OBJECTIVE:  She appears well, afebrile. Urine dipstick: not done.  ASSESSMENT:  Vaginal Discharge  Vaginal Odor   PLAN:  GC, chlamydia, trichomonas, BVAG, CVAG probe sent to lab. Treatment: To be determined once lab results are received ROV prn if symptoms persist or worsen.

## 2023-10-20 LAB — CERVICOVAGINAL ANCILLARY ONLY
Bacterial Vaginitis (gardnerella): POSITIVE — AB
Candida Glabrata: NEGATIVE
Candida Vaginitis: NEGATIVE
Chlamydia: NEGATIVE
Comment: NEGATIVE
Comment: NEGATIVE
Comment: NEGATIVE
Comment: NEGATIVE
Comment: NEGATIVE
Comment: NORMAL
Neisseria Gonorrhea: NEGATIVE
Trichomonas: NEGATIVE

## 2023-10-21 ENCOUNTER — Ambulatory Visit: Payer: Self-pay | Admitting: Obstetrics and Gynecology

## 2023-10-21 DIAGNOSIS — B9689 Other specified bacterial agents as the cause of diseases classified elsewhere: Secondary | ICD-10-CM

## 2023-10-21 MED ORDER — METRONIDAZOLE 500 MG PO TABS
500.0000 mg | ORAL_TABLET | Freq: Two times a day (BID) | ORAL | 0 refills | Status: AC
Start: 2023-10-21 — End: ?

## 2024-03-23 ENCOUNTER — Encounter: Payer: Self-pay | Admitting: Physician Assistant

## 2024-03-23 ENCOUNTER — Ambulatory Visit: Payer: Self-pay | Admitting: Physician Assistant

## 2024-03-23 ENCOUNTER — Other Ambulatory Visit (HOSPITAL_COMMUNITY)
Admission: RE | Admit: 2024-03-23 | Discharge: 2024-03-23 | Disposition: A | Source: Ambulatory Visit | Attending: Physician Assistant | Admitting: Physician Assistant

## 2024-03-23 VITALS — BP 141/96 | HR 73 | Ht 69.0 in | Wt 282.0 lb

## 2024-03-23 DIAGNOSIS — Z3046 Encounter for surveillance of implantable subdermal contraceptive: Secondary | ICD-10-CM

## 2024-03-23 DIAGNOSIS — I10 Essential (primary) hypertension: Secondary | ICD-10-CM

## 2024-03-23 DIAGNOSIS — Z113 Encounter for screening for infections with a predominantly sexual mode of transmission: Secondary | ICD-10-CM | POA: Diagnosis present

## 2024-03-23 LAB — CERVICOVAGINAL ANCILLARY ONLY
Bacterial Vaginitis (gardnerella): POSITIVE — AB
Candida Glabrata: NEGATIVE
Candida Vaginitis: NEGATIVE
Chlamydia: NEGATIVE
Comment: NEGATIVE
Comment: NEGATIVE
Comment: NEGATIVE
Comment: NEGATIVE
Comment: NEGATIVE
Comment: NORMAL
Neisseria Gonorrhea: NEGATIVE
Trichomonas: NEGATIVE

## 2024-03-23 NOTE — Progress Notes (Unsigned)
" ° ° ° °  GYNECOLOGY OFFICE PROCEDURE NOTE  Jody Palmer is a 34 y.o. [id number] here for Nexplanon  removal. Last pap smear was on 09/16/20 and was normal.  No other gynecologic concerns.  Nexplanon  Removal Patient identified, informed consent performed, consent signed.   Appropriate time out taken.   Nexplanon  site identified.  Area prepped in usual sterile fashon. 3 ml of 1% lidocaine  was used to anesthetize the area at the distal end of the implant. A small stab incision was made right beside the implant on the distal portion.  The Nexplanon  rod was grasped using hemostats and a fragment of the device was removed (about half of the original device length) from the left arm.   The patient tolerated the procedure well to this point. There was minimal blood loss.   Given procedure was technically difficult, attending physician Lynwood Solomons, MD was summoned to attempt removal of retained nexplanon  fragment. (See note)  Steri-strips were later applied over the small incision. A pressure bandage was applied to reduce any bruising.    An Xray of the humerus was ordered for visualization of the remaining device fragment.   The patient will return for removal after imaging is obtained and swelling of the arm has regressed. Patient given post procedure instructions.    Patient politely declined alternative method of contraception today.   Jaymee Tilson, PA-C 03/25/24  "

## 2024-03-23 NOTE — Progress Notes (Unsigned)
 Pt presents for Nexplanon  removal. Nexplanon  placed 03-10-21  Requesting STD testing

## 2024-03-24 LAB — HIV ANTIBODY (ROUTINE TESTING W REFLEX): HIV Screen 4th Generation wRfx: NONREACTIVE

## 2024-03-24 LAB — SYPHILIS: RPR W/REFLEX TO RPR TITER AND TREPONEMAL ANTIBODIES, TRADITIONAL SCREENING AND DIAGNOSIS ALGORITHM: RPR Ser Ql: NONREACTIVE

## 2024-03-24 LAB — HEPATITIS B SURFACE ANTIGEN: Hepatitis B Surface Ag: NEGATIVE

## 2024-03-24 LAB — HEPATITIS C ANTIBODY: Hep C Virus Ab: NONREACTIVE

## 2024-03-25 NOTE — Patient Instructions (Signed)
 Nexplanon  aftercare: Leave the top bandage on for 24 hours. Keep the smaller bandage clean, dry, and in place for 3 to 5 days.   Activity Restrictions: Avoid heavy lifting or strenuous activity for a few days to prevent bleeding or opening the small incision.  Pain Management: Bruising, soreness, and minor itching are normal. Acetaminophen  or ibuprofen  can be taken for discomfort.  When to call the office: Seek medical attention if you notice signs of infection, such as fever, chills, pus/draining, excessive swelling, or increasing pain at the site.

## 2024-03-27 ENCOUNTER — Ambulatory Visit (HOSPITAL_COMMUNITY): Payer: Self-pay | Admitting: Physician Assistant

## 2024-03-27 MED ORDER — METRONIDAZOLE 500 MG PO TABS: 500.0000 mg | ORAL_TABLET | Freq: Two times a day (BID) | ORAL | 0 refills | Status: AC
